# Patient Record
Sex: Female | Born: 1970 | Race: Black or African American | Hispanic: No | Marital: Single | Smoking: Never smoker
Health system: Southern US, Community
[De-identification: ages and names within clinical notes are randomized; demographics above are authoritative.]

## PROBLEM LIST (undated history)

## (undated) DIAGNOSIS — I10 Essential (primary) hypertension: Secondary | ICD-10-CM

## (undated) DIAGNOSIS — N898 Other specified noninflammatory disorders of vagina: Secondary | ICD-10-CM

## (undated) DIAGNOSIS — N760 Acute vaginitis: Principal | ICD-10-CM

## (undated) DIAGNOSIS — B379 Candidiasis, unspecified: Secondary | ICD-10-CM

## (undated) DIAGNOSIS — Z9289 Personal history of other medical treatment: Secondary | ICD-10-CM

## (undated) DIAGNOSIS — B9689 Other specified bacterial agents as the cause of diseases classified elsewhere: Secondary | ICD-10-CM

## (undated) DIAGNOSIS — F988 Other specified behavioral and emotional disorders with onset usually occurring in childhood and adolescence: Secondary | ICD-10-CM

## (undated) DIAGNOSIS — E119 Type 2 diabetes mellitus without complications: Secondary | ICD-10-CM

## (undated) DIAGNOSIS — F410 Panic disorder [episodic paroxysmal anxiety] without agoraphobia: Secondary | ICD-10-CM

## (undated) DIAGNOSIS — K219 Gastro-esophageal reflux disease without esophagitis: Secondary | ICD-10-CM

## (undated) DIAGNOSIS — L0232 Furuncle of buttock: Secondary | ICD-10-CM

## (undated) DIAGNOSIS — R7303 Prediabetes: Secondary | ICD-10-CM

## (undated) DIAGNOSIS — R12 Heartburn: Secondary | ICD-10-CM

## (undated) DIAGNOSIS — E669 Obesity, unspecified: Secondary | ICD-10-CM

## (undated) HISTORY — DX: Acute vaginitis: N76.0

## (undated) HISTORY — DX: Obesity, unspecified: E66.9

## (undated) HISTORY — PX: TUBAL LIGATION: SHX77

## (undated) HISTORY — DX: Other specified noninflammatory disorders of vagina: N89.8

## (undated) HISTORY — DX: Other specified bacterial agents as the cause of diseases classified elsewhere: B96.89

## (undated) HISTORY — PX: WISDOM TOOTH EXTRACTION: SHX21

## (undated) HISTORY — DX: Essential (primary) hypertension: I10

## (undated) HISTORY — DX: Gastro-esophageal reflux disease without esophagitis: K21.9

## (undated) HISTORY — DX: Candidiasis, unspecified: B37.9

## (undated) HISTORY — DX: Other specified behavioral and emotional disorders with onset usually occurring in childhood and adolescence: F98.8

## (undated) HISTORY — DX: Type 2 diabetes mellitus without complications: E11.9

## (undated) HISTORY — DX: Furuncle of buttock: L02.32

## (undated) HISTORY — DX: Heartburn: R12

## (undated) HISTORY — DX: Prediabetes: R73.03

---

## 2001-04-05 ENCOUNTER — Other Ambulatory Visit: Admission: RE | Admit: 2001-04-05 | Discharge: 2001-04-05 | Payer: Self-pay | Admitting: *Deleted

## 2003-03-18 ENCOUNTER — Emergency Department (HOSPITAL_COMMUNITY): Admission: EM | Admit: 2003-03-18 | Discharge: 2003-03-18 | Payer: Self-pay | Admitting: Emergency Medicine

## 2003-11-27 ENCOUNTER — Ambulatory Visit (HOSPITAL_COMMUNITY): Admission: RE | Admit: 2003-11-27 | Discharge: 2003-11-27 | Payer: Self-pay | Admitting: Family Medicine

## 2005-04-13 ENCOUNTER — Emergency Department (HOSPITAL_COMMUNITY): Admission: EM | Admit: 2005-04-13 | Discharge: 2005-04-13 | Payer: Self-pay | Admitting: Emergency Medicine

## 2007-12-20 ENCOUNTER — Other Ambulatory Visit: Admission: RE | Admit: 2007-12-20 | Discharge: 2007-12-20 | Payer: Self-pay | Admitting: Obstetrics and Gynecology

## 2010-01-16 ENCOUNTER — Ambulatory Visit (HOSPITAL_COMMUNITY): Admission: RE | Admit: 2010-01-16 | Discharge: 2010-01-16 | Payer: Self-pay | Admitting: Family Medicine

## 2010-08-12 ENCOUNTER — Other Ambulatory Visit: Admission: RE | Admit: 2010-08-12 | Discharge: 2010-08-12 | Payer: Self-pay | Admitting: Obstetrics and Gynecology

## 2010-11-02 ENCOUNTER — Encounter: Payer: Self-pay | Admitting: Family Medicine

## 2011-02-09 ENCOUNTER — Other Ambulatory Visit (HOSPITAL_COMMUNITY): Payer: Self-pay | Admitting: Internal Medicine

## 2011-02-09 DIAGNOSIS — Z139 Encounter for screening, unspecified: Secondary | ICD-10-CM

## 2011-02-19 ENCOUNTER — Ambulatory Visit (HOSPITAL_COMMUNITY)
Admission: RE | Admit: 2011-02-19 | Discharge: 2011-02-19 | Disposition: A | Discharge status: Home / Self Care | Payer: BC Managed Care – PPO | Source: Ambulatory Visit | Attending: Internal Medicine | Admitting: Internal Medicine

## 2011-02-19 DIAGNOSIS — Z139 Encounter for screening, unspecified: Secondary | ICD-10-CM

## 2011-02-19 DIAGNOSIS — Z1231 Encounter for screening mammogram for malignant neoplasm of breast: Secondary | ICD-10-CM | POA: Insufficient documentation

## 2011-06-16 ENCOUNTER — Ambulatory Visit: Payer: BC Managed Care – PPO | Admitting: Family Medicine

## 2011-08-18 ENCOUNTER — Other Ambulatory Visit (HOSPITAL_COMMUNITY)
Admission: RE | Admit: 2011-08-18 | Discharge: 2011-08-18 | Disposition: A | Discharge status: Home / Self Care | Payer: BC Managed Care – PPO | Source: Ambulatory Visit | Attending: Obstetrics and Gynecology | Admitting: Obstetrics and Gynecology

## 2011-08-18 ENCOUNTER — Other Ambulatory Visit: Payer: Self-pay | Admitting: Adult Health

## 2011-08-18 DIAGNOSIS — Z01419 Encounter for gynecological examination (general) (routine) without abnormal findings: Secondary | ICD-10-CM | POA: Insufficient documentation

## 2012-03-14 ENCOUNTER — Other Ambulatory Visit (HOSPITAL_COMMUNITY): Payer: Self-pay | Admitting: Family Medicine

## 2012-03-14 DIAGNOSIS — Z139 Encounter for screening, unspecified: Secondary | ICD-10-CM

## 2012-03-21 ENCOUNTER — Ambulatory Visit (HOSPITAL_COMMUNITY)
Admission: RE | Admit: 2012-03-21 | Discharge: 2012-03-21 | Disposition: A | Discharge status: Home / Self Care | Payer: BC Managed Care – PPO | Source: Ambulatory Visit | Attending: Family Medicine | Admitting: Family Medicine

## 2012-03-21 DIAGNOSIS — Z1231 Encounter for screening mammogram for malignant neoplasm of breast: Secondary | ICD-10-CM | POA: Insufficient documentation

## 2012-03-21 DIAGNOSIS — Z139 Encounter for screening, unspecified: Secondary | ICD-10-CM

## 2012-05-27 ENCOUNTER — Ambulatory Visit (INDEPENDENT_AMBULATORY_CARE_PROVIDER_SITE_OTHER): Payer: BC Managed Care – PPO | Admitting: Family Medicine

## 2012-05-27 ENCOUNTER — Other Ambulatory Visit: Payer: Self-pay | Admitting: Family Medicine

## 2012-05-27 ENCOUNTER — Encounter: Payer: Self-pay | Admitting: Family Medicine

## 2012-05-27 VITALS — BP 138/76 | HR 92 | Resp 18 | Ht 65.5 in | Wt 242.1 lb

## 2012-05-27 DIAGNOSIS — I10 Essential (primary) hypertension: Secondary | ICD-10-CM

## 2012-05-27 DIAGNOSIS — Z130 Encounter for screening for diseases of the blood and blood-forming organs and certain disorders involving the immune mechanism: Secondary | ICD-10-CM

## 2012-05-27 DIAGNOSIS — E669 Obesity, unspecified: Secondary | ICD-10-CM

## 2012-05-27 DIAGNOSIS — Z1329 Encounter for screening for other suspected endocrine disorder: Secondary | ICD-10-CM

## 2012-05-27 DIAGNOSIS — Z1321 Encounter for screening for nutritional disorder: Secondary | ICD-10-CM

## 2012-05-27 LAB — LIPID PANEL
HDL: 34 mg/dL — ABNORMAL LOW (ref >39)
LDL Cholesterol: 124 mg/dL — ABNORMAL HIGH (ref 0–99)
Total CHOL/HDL Ratio: 5.4 Ratio
Triglycerides: 134 mg/dL (ref <150)
VLDL: 27 mg/dL (ref 0–40)

## 2012-05-27 LAB — CBC
HCT: 35.3 % — ABNORMAL LOW (ref 36.0–46.0)
MCH: 25.7 pg — ABNORMAL LOW (ref 26.0–34.0)
MCHC: 32.9 g/dL (ref 30.0–36.0)
MCV: 78.1 fL (ref 78.0–100.0)
RDW: 14.3 % (ref 11.5–15.5)
WBC: 4.7 10*3/uL (ref 4.0–10.5)

## 2012-05-27 LAB — COMPREHENSIVE METABOLIC PANEL
ALT: 19 U/L (ref 0–35)
AST: 19 U/L (ref 0–37)
Alkaline Phosphatase: 65 U/L (ref 39–117)
Creat: 0.75 mg/dL (ref 0.50–1.10)
Total Bilirubin: 0.5 mg/dL (ref 0.3–1.2)

## 2012-05-27 NOTE — Patient Instructions (Addendum)
Continue blood pressure pill  Goal is 20 lbs weight loss by next visit in 3 months  Get the labs done, we will call with results  Follow diet handout Increase activity to 30 minutes 5 days a week F/U 3 months

## 2012-05-27 NOTE — Progress Notes (Signed)
  Subjective:    Patient ID: Ariel [name], female    DOB: 1971/08/05, 41 y.o.   MRN: [id number]  HPI Patient presents to establish care. Previous PCP [facility].  Medications and history reviewed  She's concerned about her weight. She is currently at her heaviest at 242 pounds. She has used phentermine and injections in the past when she was at a weight loss clinic many years ago she lost 20 pounds at that time. She recently started walking on a treadmill 30 minutes 3 times a week. She is a very poor diet and often snacks throughout the day. She works second shift and typically eat at [employer] located in her work.    Review of Systems   GEN- denies fatigue, fever, weight loss,weakness, recent illness HEENT- denies eye drainage, change in vision, nasal discharge, CVS- denies chest pain, palpitations RESP- denies SOB, cough, wheeze ABD- denies N/V, change in stools, abd pain GU- denies dysuria, hematuria, dribbling, incontinence MSK- denies joint pain, muscle aches, injury Neuro- denies headache, dizziness, syncope, seizure activity      Objective:   Physical Exam GEN- NAD, alert and oriented x3 HEENT- PERRL, EOMI, non injected sclera, pink conjunctiva, MMM, oropharynx clear Neck- Supple, no thyromegaly CVS- RRR, no murmur RESP-CTAB ABD-NABS,soft,NT,ND EXT- No edema Pulses- Radial, DP- 2+ Psych-normal affect and Mood       Assessment & Plan:

## 2012-05-30 ENCOUNTER — Encounter: Payer: Self-pay | Admitting: Family Medicine

## 2012-05-30 DIAGNOSIS — I10 Essential (primary) hypertension: Secondary | ICD-10-CM | POA: Insufficient documentation

## 2012-05-30 LAB — HEMOGLOBIN A1C: Mean Plasma Glucose: 128 mg/dL — ABNORMAL HIGH (ref <117)

## 2012-05-30 NOTE — Assessment & Plan Note (Addendum)
Patient motivated to lose weight. She needs baseline labs reviewed. In current should increase exercise and change in diet. We will consider restarting phentermine Short-term goal set. She was given handout for 1500-calorie diet

## 2012-05-30 NOTE — Assessment & Plan Note (Addendum)
Continue HCTZ for blood pressure. Will obtain baseline labs including metabolic panel CBC and thyroid studies

## 2012-06-01 LAB — VITAMIN D 1,25 DIHYDROXY: Vitamin D3 1, 25 (OH)2: 45 pg/mL

## 2012-09-27 ENCOUNTER — Other Ambulatory Visit (HOSPITAL_COMMUNITY)
Admission: RE | Admit: 2012-09-27 | Discharge: 2012-09-27 | Disposition: A | Discharge status: Home / Self Care | Payer: BC Managed Care – PPO | Source: Ambulatory Visit | Attending: Obstetrics and Gynecology | Admitting: Obstetrics and Gynecology

## 2012-09-27 ENCOUNTER — Other Ambulatory Visit: Payer: Self-pay | Admitting: Adult Health

## 2012-09-27 DIAGNOSIS — Z01419 Encounter for gynecological examination (general) (routine) without abnormal findings: Secondary | ICD-10-CM | POA: Insufficient documentation

## 2012-09-27 DIAGNOSIS — Z1151 Encounter for screening for human papillomavirus (HPV): Secondary | ICD-10-CM | POA: Insufficient documentation

## 2012-09-30 ENCOUNTER — Encounter (HOSPITAL_COMMUNITY): Payer: Self-pay

## 2012-09-30 ENCOUNTER — Emergency Department (HOSPITAL_COMMUNITY)
Admission: EM | Admit: 2012-09-30 | Discharge: 2012-09-30 | Disposition: A | Discharge status: Home / Self Care | Payer: BC Managed Care – PPO | Attending: Emergency Medicine | Admitting: Emergency Medicine

## 2012-09-30 DIAGNOSIS — S61209A Unspecified open wound of unspecified finger without damage to nail, initial encounter: Secondary | ICD-10-CM | POA: Insufficient documentation

## 2012-09-30 DIAGNOSIS — S61219A Laceration without foreign body of unspecified finger without damage to nail, initial encounter: Secondary | ICD-10-CM

## 2012-09-30 DIAGNOSIS — I10 Essential (primary) hypertension: Secondary | ICD-10-CM | POA: Insufficient documentation

## 2012-09-30 DIAGNOSIS — Y93G1 Activity, food preparation and clean up: Secondary | ICD-10-CM | POA: Insufficient documentation

## 2012-09-30 DIAGNOSIS — Y929 Unspecified place or not applicable: Secondary | ICD-10-CM | POA: Insufficient documentation

## 2012-09-30 DIAGNOSIS — Z79899 Other long term (current) drug therapy: Secondary | ICD-10-CM | POA: Insufficient documentation

## 2012-09-30 DIAGNOSIS — Z23 Encounter for immunization: Secondary | ICD-10-CM | POA: Insufficient documentation

## 2012-09-30 DIAGNOSIS — W268XXA Contact with other sharp object(s), not elsewhere classified, initial encounter: Secondary | ICD-10-CM | POA: Insufficient documentation

## 2012-09-30 MED ORDER — TETANUS-DIPHTH-ACELL PERTUSSIS 5-2.5-18.5 LF-MCG/0.5 IM SUSP
INTRAMUSCULAR | Status: AC
  Administered 2012-09-30: 0.5 mL via INTRAMUSCULAR
  Filled 2012-09-30: qty 0.5

## 2012-09-30 MED ORDER — LIDOCAINE HCL (PF) 1 % IJ SOLN
INTRAMUSCULAR | Status: AC
  Filled 2012-09-30: qty 5

## 2012-09-30 MED ORDER — TETANUS-DIPHTHERIA TOXOIDS TD 5-2 LFU IM INJ
0.5000 mL | INJECTION | Freq: Once | INTRAMUSCULAR | Status: DC
  Filled 2012-09-30: qty 0.5

## 2012-09-30 MED ORDER — TETANUS-DIPHTH-ACELL PERTUSSIS 5-2.5-18.5 LF-MCG/0.5 IM SUSP
0.5000 mL | Freq: Once | INTRAMUSCULAR | Status: AC
  Administered 2012-09-30: 0.5 mL via INTRAMUSCULAR
  Filled 2012-09-30: qty 0.5

## 2012-09-30 NOTE — ED Notes (Signed)
Pt with laceration to right middle finger tip after washing dishes. Bleeding self controlled. Wound appears to be 1in going from right side cuticle to middle for first pad.

## 2012-09-30 NOTE — ED Notes (Signed)
Patient with no complaints at this time. Respirations even and unlabored. Skin warm/dry. Discharge instructions reviewed with patient at this time. Patient given opportunity to voice concerns/ask questions. Patient discharged at this time and left Emergency Department with steady gait.   

## 2012-09-30 NOTE — ED Notes (Signed)
Laceration cart and tray placed at bedside for laceration repair.

## 2012-09-30 NOTE — ED Provider Notes (Signed)
Medical screening examination/treatment/procedure(s) were performed by non-physician practitioner and as supervising physician I was immediately available for consultation/collaboration.   Joya Gaskins, MD 09/30/12 [phone]

## 2012-09-30 NOTE — ED Provider Notes (Signed)
History     CSN: [id number]  Arrival date & time 09/30/12  0902   First MD Initiated Contact with Patient 09/30/12 [phone]      Chief Complaint  Patient presents with  . Extremity Laceration    HPI Ariel [name] is a 41 y.o. female who presents to the ED with a laceration to the right middle finger. The laceration occurred this morning. Patient states she was washing dishes and a glass broke and cut the tip of her finger. She denies any other problems. The history was provided by the patient.  Past Medical History  Diagnosis Date  . Hypertension     Past Surgical History  Procedure Date  . Tubal ligation     Family History  Problem Relation Age of Onset  . Hypertension Mother   . Diabetes Father   . Hypertension Father     History  Substance Use Topics  . Smoking status: Never Smoker   . Smokeless tobacco: Not on file  . Alcohol Use: No    OB History    Grav Para Term Preterm Abortions TAB SAB Ect Mult Living                  Review of Systems: As stated in HPI  Allergies  Review of patient's allergies indicates no known allergies.  Home Medications   Current Outpatient Rx  Name  Route  Sig  Dispense  Refill  . HYDROCHLOROTHIAZIDE 12.5 MG PO CAPS   Oral   Take 12.5 mg by mouth daily.           BP 150/80  Pulse 87  Temp 98.2 F (36.8 C) (Oral)  Resp 18  Ht 5\' 5"  (1.651 m)  Wt 240 lb (108.863 kg)  BMI 39.94 kg/m2  SpO2 99%  LMP 08/31/2012  Physical Exam  Nursing note and vitals reviewed. Constitutional: She is oriented to person, place, and time. She appears well-developed and well-nourished. No distress.  HENT:  Head: Normocephalic.  Eyes: EOM are normal.  Neck: Neck supple.  Cardiovascular: Normal rate.   Pulmonary/Chest: Effort normal.  Musculoskeletal: Normal range of motion.       Laceration to right middle finger. Normal sensation, good strength. Bleeding controlled. See skin exam  Neurological: She is alert and oriented to  person, place, and time.  Skin: Skin is warm and dry. Laceration noted.       Laceration to tip of right middle finger.  Psychiatric: She has a normal mood and affect. Her behavior is normal. Judgment and thought content normal.   ED Course  Procedures Verbal consent for laceration repair of right middle finger 1 cm laceration to fingertip  Patient positioned and area draped with sterile towels.  Preoperative medication:  Local infiltrate with lidocaine 1%. Amount 1 ccs Cleaned with betadine Irrigated with NSS  Wound closed with 5-0 prolene x 4 sutures simple interrupted Patient tolerated procedure well  TDap given 0.5 ml  Assessment: 41 y.o. female with laceration to right middle finger  Plan:  Bacitracin Ointment, dressing   Return for suture removal in 7 days, return sooner for any problems.   Advil prn  Discussed with the patient and all questioned fully answered. She willreturn if any problems arise.     Janne Napoleon, [place] 09/30/12 1036

## 2012-09-30 NOTE — ED Notes (Signed)
Patient provided VIS for Tdap, education provided by RN on normal site reactions and s/s to be aware of for tetanus shot and when to seek immediate medical attention. Patient gave verbal understanding.

## 2013-05-17 ENCOUNTER — Other Ambulatory Visit: Payer: Self-pay | Admitting: Obstetrics and Gynecology

## 2013-06-27 ENCOUNTER — Other Ambulatory Visit: Payer: Self-pay | Admitting: Obstetrics and Gynecology

## 2013-07-12 ENCOUNTER — Ambulatory Visit (INDEPENDENT_AMBULATORY_CARE_PROVIDER_SITE_OTHER): Payer: BC Managed Care – PPO | Admitting: Family Medicine

## 2013-07-12 ENCOUNTER — Encounter: Payer: Self-pay | Admitting: Family Medicine

## 2013-07-12 VITALS — BP 124/80 | HR 90 | Temp 97.6°F | Resp 20 | Ht 64.0 in | Wt 255.0 lb

## 2013-07-12 DIAGNOSIS — Z0000 Encounter for general adult medical examination without abnormal findings: Secondary | ICD-10-CM

## 2013-07-12 DIAGNOSIS — R7303 Prediabetes: Secondary | ICD-10-CM

## 2013-07-12 DIAGNOSIS — I10 Essential (primary) hypertension: Secondary | ICD-10-CM

## 2013-07-12 DIAGNOSIS — E669 Obesity, unspecified: Secondary | ICD-10-CM

## 2013-07-12 DIAGNOSIS — E119 Type 2 diabetes mellitus without complications: Secondary | ICD-10-CM | POA: Insufficient documentation

## 2013-07-12 DIAGNOSIS — Z1231 Encounter for screening mammogram for malignant neoplasm of breast: Secondary | ICD-10-CM

## 2013-07-12 DIAGNOSIS — R300 Dysuria: Secondary | ICD-10-CM

## 2013-07-12 DIAGNOSIS — N390 Urinary tract infection, site not specified: Secondary | ICD-10-CM

## 2013-07-12 DIAGNOSIS — Z23 Encounter for immunization: Secondary | ICD-10-CM

## 2013-07-12 DIAGNOSIS — R7309 Other abnormal glucose: Secondary | ICD-10-CM

## 2013-07-12 LAB — LIPID PANEL
HDL: 39 mg/dL — ABNORMAL LOW (ref >39)
LDL Cholesterol: 102 mg/dL — ABNORMAL HIGH (ref 0–99)
Triglycerides: 120 mg/dL (ref <150)

## 2013-07-12 LAB — CBC WITH DIFFERENTIAL/PLATELET
Eosinophils Relative: 2 % (ref 0–5)
HCT: 35.1 % — ABNORMAL LOW (ref 36.0–46.0)
Lymphocytes Relative: 38 % (ref 12–46)
Lymphs Abs: 2.1 10*3/uL (ref 0.7–4.0)
MCV: 79.1 fL (ref 78.0–100.0)
Monocytes Absolute: 0.5 10*3/uL (ref 0.1–1.0)
RBC: 4.44 MIL/uL (ref 3.87–5.11)
WBC: 5.7 10*3/uL (ref 4.0–10.5)

## 2013-07-12 LAB — URINALYSIS, ROUTINE W REFLEX MICROSCOPIC
Leukocytes, UA: NEGATIVE
Nitrite: NEGATIVE
pH: 6 (ref 5.0–8.0)

## 2013-07-12 LAB — COMPREHENSIVE METABOLIC PANEL
BUN: 9 mg/dL (ref 6–23)
CO2: 27 mEq/L (ref 19–32)
Creat: 0.74 mg/dL (ref 0.50–1.10)
Glucose, Bld: 103 mg/dL — ABNORMAL HIGH (ref 70–99)
Total Bilirubin: 0.4 mg/dL (ref 0.3–1.2)

## 2013-07-12 LAB — HEMOGLOBIN A1C
Hgb A1c MFr Bld: 6.5 % — ABNORMAL HIGH (ref <5.7)
Mean Plasma Glucose: 140 mg/dL — ABNORMAL HIGH (ref <117)

## 2013-07-12 LAB — URINALYSIS, MICROSCOPIC ONLY: Casts: NONE SEEN

## 2013-07-12 MED ORDER — PHENTERMINE HCL 37.5 MG PO TABS: 37.5000 mg | ORAL_TABLET | Freq: Every day | ORAL | Status: DC

## 2013-07-12 MED ORDER — HYDROCHLOROTHIAZIDE 12.5 MG PO CAPS: ORAL_CAPSULE | ORAL | Status: DC

## 2013-07-12 NOTE — Assessment & Plan Note (Signed)
Plan to start phentermine as directed. Weight loss plan has been set up. Including exercise see instructions. Followup 2 months

## 2013-07-12 NOTE — Patient Instructions (Addendum)
Get the mammogram done  Start phentermine 1/2 tablet x 2 weeks, then increase to 1 tablet Exercise  4 days week -- 30 minutes a day  No soda, okay to have 1 tea a day, rest water  Avoid fried foods, and fast food- get salad  Eat more fruits and veggies with each meal Flu shot given We will send urine culture F/u 2 months

## 2013-07-12 NOTE — Assessment & Plan Note (Signed)
Check A1C 

## 2013-07-12 NOTE — Assessment & Plan Note (Signed)
BP looks okay, will continue HCTZ

## 2013-07-12 NOTE — Progress Notes (Signed)
  Subjective:    Patient ID: Ariel [name], female    DOB: December 09, 1970, 42 y.o.   MRN: [id number]  HPI Patient here for complete physical exam without Pap smear. She was last seen at her visit when she establish care in August of 2013. She's history of hypertension as well as prediabetes noted on labs from last year. She's concerned about her weight and wants to restart phentermine. In the past she lost 15-20 pounds using this but was only on for a couple months. At that time she is being followed by weight loss clinic but was unable to afford to keep going. She does not work out on a regular basis. She has very poor dietary habits including fast food, soda and snacking. She does work third shift which she feels her meals get mixed up because of this.  Her last Pap smear was in December 2013 which was within normal limits [facility]. She is overdue for her mammogram. She is due for flu shot.  She has had some mild dysuria and vaginal irritation for the past couple weeks. She feels some pressure when she urinates. She denies any vaginal discharge or abnormal vaginal bleeding. She is due to start her menses in 2 days.   Review of Systems  GEN- denies fatigue, fever, weight loss,weakness, recent illness HEENT- denies eye drainage, change in vision, nasal discharge, CVS- denies chest pain, palpitations RESP- denies SOB, cough, wheeze ABD- denies N/V, change in stools, abd pain GU- + dysuria, denies hematuria, dribbling, incontinence MSK- denies joint pain, muscle aches, injury Neuro- denies headache, dizziness, syncope, seizure activity      Objective:   Physical Exam GEN- NAD, alert and oriented x3,obese HEENT- PERRL, EOMI, non injected sclera, pink conjunctiva, MMM, oropharynx clear Neck- Supple, no thyromegaly CVS- RRR, no murmur RESP-CTAB ABD-NABS,soft,NT,ND, no CVA tenderness GU- Deferred EXT- No edema Pulses- Radial, DP- 2+        Assessment & Plan:   CPE- CPE  done, PAP UTD, schedule Mammogram, fasting labs today. FLu shot given

## 2013-07-12 NOTE — Assessment & Plan Note (Signed)
UA shows bacteria, cloudy appearance Will send for culture

## 2013-07-13 LAB — URINE CULTURE

## 2013-07-18 ENCOUNTER — Ambulatory Visit (HOSPITAL_COMMUNITY)
Admission: RE | Admit: 2013-07-18 | Discharge: 2013-07-18 | Disposition: A | Discharge status: Home / Self Care | Payer: BC Managed Care – PPO | Source: Ambulatory Visit | Attending: Family Medicine | Admitting: Family Medicine

## 2013-07-18 DIAGNOSIS — Z1231 Encounter for screening mammogram for malignant neoplasm of breast: Secondary | ICD-10-CM | POA: Insufficient documentation

## 2013-08-09 ENCOUNTER — Encounter: Payer: Self-pay | Admitting: Adult Health

## 2013-08-09 ENCOUNTER — Ambulatory Visit (INDEPENDENT_AMBULATORY_CARE_PROVIDER_SITE_OTHER): Payer: BC Managed Care – PPO | Admitting: Adult Health

## 2013-08-09 VITALS — BP 140/90 | Ht 65.0 in | Wt 252.0 lb

## 2013-08-09 DIAGNOSIS — I10 Essential (primary) hypertension: Secondary | ICD-10-CM

## 2013-08-09 DIAGNOSIS — A499 Bacterial infection, unspecified: Secondary | ICD-10-CM

## 2013-08-09 DIAGNOSIS — B9689 Other specified bacterial agents as the cause of diseases classified elsewhere: Secondary | ICD-10-CM

## 2013-08-09 DIAGNOSIS — N898 Other specified noninflammatory disorders of vagina: Secondary | ICD-10-CM

## 2013-08-09 DIAGNOSIS — N760 Acute vaginitis: Secondary | ICD-10-CM

## 2013-08-09 DIAGNOSIS — L293 Anogenital pruritus, unspecified: Secondary | ICD-10-CM

## 2013-08-09 HISTORY — DX: Other specified noninflammatory disorders of vagina: N89.8

## 2013-08-09 HISTORY — DX: Other specified bacterial agents as the cause of diseases classified elsewhere: B96.89

## 2013-08-09 LAB — POCT WET PREP (WET MOUNT): WBC, Wet Prep HPF POC: POSITIVE

## 2013-08-09 MED ORDER — METRONIDAZOLE 500 MG PO TABS: 500.0000 mg | ORAL_TABLET | Freq: Two times a day (BID) | ORAL | Status: DC

## 2013-08-09 NOTE — Patient Instructions (Signed)
Bacterial Vaginosis Bacterial vaginosis (BV) is a vaginal infection where the normal balance of bacteria in the vagina is disrupted. The normal balance is then replaced by an overgrowth of certain bacteria. There are several different kinds of bacteria that can cause BV. BV is the most common vaginal infection in women of childbearing age. CAUSES   The cause of BV is not fully understood. BV develops when there is an increase or imbalance of harmful bacteria.  Some activities or behaviors can upset the normal balance of bacteria in the vagina and put women at increased risk including:  Having a new sex partner or multiple sex partners.  Douching.  Using an intrauterine device (IUD) for contraception.  It is not clear what role sexual activity plays in the development of BV. However, women that have never had sexual intercourse are rarely infected with BV. Women do not get BV from toilet seats, bedding, swimming pools or from touching objects around them.  SYMPTOMS   Grey vaginal discharge.  A fish-like odor with discharge, especially after sexual intercourse.  Itching or burning of the vagina and vulva.  Burning or pain with urination.  Some women have no signs or symptoms at all. DIAGNOSIS  Your caregiver must examine the vagina for signs of BV. Your caregiver will perform lab tests and look at the sample of vaginal fluid through a microscope. They will look for bacteria and abnormal cells (clue cells), a pH test higher than 4.5, and a positive amine test all associated with BV.  RISKS AND COMPLICATIONS   Pelvic inflammatory disease (PID).  Infections following gynecology surgery.  Developing HIV.  Developing herpes virus. TREATMENT  Sometimes BV will clear up without treatment. However, all women with symptoms of BV should be treated to avoid complications, especially if gynecology surgery is planned. Female partners generally do not need to be treated. However, BV may spread  between female sex partners so treatment is helpful in preventing a recurrence of BV.   BV may be treated with antibiotics. The antibiotics come in either pill or vaginal cream forms. Either can be used with nonpregnant or pregnant women, but the recommended dosages differ. These antibiotics are not harmful to the baby.  BV can recur after treatment. If this happens, a second round of antibiotics will often be prescribed.  Treatment is important for pregnant women. If not treated, BV can cause a premature delivery, especially for a pregnant woman who had a premature birth in the past. All pregnant women who have symptoms of BV should be checked and treated.  For chronic reoccurrence of BV, treatment with a type of prescribed gel vaginally twice a week is helpful. HOME CARE INSTRUCTIONS   Finish all medication as directed by your caregiver.  Do not have sex until treatment is completed.  Tell your sexual partner that you have a vaginal infection. They should see their caregiver and be treated if they have problems, such as a mild rash or itching.  Practice safe sex. Use condoms. Only have 1 sex partner. PREVENTION  Basic prevention steps can help reduce the risk of upsetting the natural balance of bacteria in the vagina and developing BV:  Do not have sexual intercourse (be abstinent).  Do not douche.  Use all of the medicine prescribed for treatment of BV, even if the signs and symptoms go away.  Tell your sex partner if you have BV. That way, they can be treated, if needed, to prevent reoccurrence. SEEK MEDICAL CARE IF:     Your symptoms are not improving after 3 days of treatment.  You have increased discharge, pain, or fever. MAKE SURE YOU:   Understand these instructions.  Will watch your condition.  Will get help right away if you are not doing well or get worse. FOR MORE INFORMATION  Division of STD Prevention (DSTDP), Centers for Disease Control and Prevention:  [employer]s.[employer].za American Social Health Association (ASHA): www.ashastd.org  Document Released: 09/28/2005 Document Revised: 12/21/2011 Document Reviewed: 03/21/2009 [facility] Patient Information 2014 [place], [place]. Take flagyl no sex no alcohol Take BP MED

## 2013-08-09 NOTE — Progress Notes (Signed)
Subjective:     Patient ID: Ariel [name], female   DOB: Mar 04, 1971, 42 y.o.   MRN: [id number]  HPI Ariel [name] is a 42 year old black female in complaining of vaginal itch and odor at times, sometimes it burns.No sex lately.Has not taken BP meds lately and is taking adipex by Dr Jeanice Lim.  Review of Systems Positives in HPI Reviewed past medical,surgical, social and family history. Reviewed medications and allergies.     Objective:   Physical Exam BP 140/90  Ht 5\' 5"  (1.651 m)  Wt 252 lb (114.306 kg)  BMI 41.93 kg/m2  LMP 06/18/2013   Skin warm and dry.Pelvic: external genitalia is normal in appearance, vagina: white discharge with odor, cervix:smooth and bulbous, uterus: normal size, shape and contour, non tender, no masses felt, adnexa: no masses or tenderness noted. Wet prep: + for clue cells and +WBCs.  Assessment:     Vaginal itch BV   Hypertension  Plan:     Rx flagyl 500 mg 1 bid x 7 days, no alcohol, review handout on BV,no sex Take BP meds, decrease salt try to lose 10 lbs Follow up prn

## 2013-09-13 ENCOUNTER — Ambulatory Visit: Payer: BC Managed Care – PPO | Admitting: Family Medicine

## 2013-12-25 ENCOUNTER — Other Ambulatory Visit: Payer: BC Managed Care – PPO | Admitting: Adult Health

## 2014-01-09 ENCOUNTER — Other Ambulatory Visit: Payer: Self-pay | Admitting: Family Medicine

## 2014-01-10 NOTE — Telephone Encounter (Signed)
Declined needs appt

## 2014-01-10 NOTE — Telephone Encounter (Signed)
Ok to refill??  Last office visit/ refill 07/12/2013.

## 2014-01-11 NOTE — Telephone Encounter (Signed)
Appointment scheduled.

## 2014-01-11 NOTE — Telephone Encounter (Signed)
LMTRC

## 2014-01-15 ENCOUNTER — Encounter: Payer: Self-pay | Admitting: Family Medicine

## 2014-01-15 ENCOUNTER — Ambulatory Visit (INDEPENDENT_AMBULATORY_CARE_PROVIDER_SITE_OTHER): Payer: BC Managed Care – PPO | Admitting: Family Medicine

## 2014-01-15 VITALS — BP 152/88 | HR 72 | Temp 97.7°F | Resp 14 | Ht 65.0 in | Wt 247.0 lb

## 2014-01-15 DIAGNOSIS — M25561 Pain in right knee: Secondary | ICD-10-CM

## 2014-01-15 DIAGNOSIS — E669 Obesity, unspecified: Secondary | ICD-10-CM

## 2014-01-15 DIAGNOSIS — I10 Essential (primary) hypertension: Secondary | ICD-10-CM

## 2014-01-15 DIAGNOSIS — R7303 Prediabetes: Secondary | ICD-10-CM

## 2014-01-15 DIAGNOSIS — M25569 Pain in unspecified knee: Secondary | ICD-10-CM

## 2014-01-15 DIAGNOSIS — R7309 Other abnormal glucose: Secondary | ICD-10-CM

## 2014-01-15 LAB — COMPREHENSIVE METABOLIC PANEL
ALT: 17 U/L (ref 0–35)
AST: 17 U/L (ref 0–37)
Albumin: 3.9 g/dL (ref 3.5–5.2)
Alkaline Phosphatase: 68 U/L (ref 39–117)
BUN: 7 mg/dL (ref 6–23)
CALCIUM: 9.4 mg/dL (ref 8.4–10.5)
CHLORIDE: 100 meq/L (ref 96–112)
CO2: 26 meq/L (ref 19–32)
CREATININE: 0.8 mg/dL (ref 0.50–1.10)
Glucose, Bld: 120 mg/dL — ABNORMAL HIGH (ref 70–99)
Potassium: 4.1 mEq/L (ref 3.5–5.3)
Sodium: 135 mEq/L (ref 135–145)
Total Bilirubin: 0.4 mg/dL (ref 0.2–1.2)
Total Protein: 7 g/dL (ref 6.0–8.3)

## 2014-01-15 LAB — CBC WITH DIFFERENTIAL/PLATELET
BASOS PCT: 0 % (ref 0–1)
Basophils Absolute: 0 10*3/uL (ref 0.0–0.1)
EOS ABS: 0.1 10*3/uL (ref 0.0–0.7)
Eosinophils Relative: 2 % (ref 0–5)
HEMATOCRIT: 36.9 % (ref 36.0–46.0)
HEMOGLOBIN: 12.4 g/dL (ref 12.0–15.0)
Lymphocytes Relative: 38 % (ref 12–46)
Lymphs Abs: 2.1 10*3/uL (ref 0.7–4.0)
MCH: 26 pg (ref 26.0–34.0)
MCHC: 33.6 g/dL (ref 30.0–36.0)
MCV: 77.4 fL — ABNORMAL LOW (ref 78.0–100.0)
MONO ABS: 0.5 10*3/uL (ref 0.1–1.0)
MONOS PCT: 9 % (ref 3–12)
NEUTROS PCT: 51 % (ref 43–77)
Neutro Abs: 2.8 10*3/uL (ref 1.7–7.7)
Platelets: 333 10*3/uL (ref 150–400)
RBC: 4.77 MIL/uL (ref 3.87–5.11)
RDW: 14 % (ref 11.5–15.5)
WBC: 5.5 10*3/uL (ref 4.0–10.5)

## 2014-01-15 LAB — LIPID PANEL
CHOL/HDL RATIO: 4.5 ratio
CHOLESTEROL: 170 mg/dL (ref 0–200)
HDL: 38 mg/dL — AB (ref >39)
LDL Cholesterol: 107 mg/dL — ABNORMAL HIGH (ref 0–99)
TRIGLYCERIDES: 127 mg/dL (ref <150)
VLDL: 25 mg/dL (ref 0–40)

## 2014-01-15 LAB — HEMOGLOBIN A1C
Hgb A1c MFr Bld: 6.6 % — ABNORMAL HIGH (ref <5.7)
Mean Plasma Glucose: 143 mg/dL — ABNORMAL HIGH (ref <117)

## 2014-01-15 MED ORDER — PHENTERMINE HCL 37.5 MG PO TABS: 37.5000 mg | ORAL_TABLET | Freq: Every day | ORAL | Status: DC

## 2014-01-15 MED ORDER — NAPROXEN 500 MG PO TABS: 500.0000 mg | ORAL_TABLET | Freq: Two times a day (BID) | ORAL | Status: DC

## 2014-01-15 NOTE — Assessment & Plan Note (Signed)
No red flags on exam good range of motion no effusion. (Naprosyn twice a day think we can hold on imaging she still able to do her regular activities. She does not improve within 4 weeks and we will consider imaging her knee. She may have some mild osteoarthritis as well do to her weight and her activity

## 2014-01-15 NOTE — Progress Notes (Signed)
Patient ID: Ariel [name], female   DOB: 07/29/1971, 43 y.o.   MRN: [id number]   Subjective:    Patient ID: Ariel ScrapeRita T [name], female    DOB: 06/10/1971, 43 y.o.   MRN: [id number]  Patient presents for F/U phenteramine and R knee pain  patient here to followup phentermine. She has a BMI of 41. She was started on phentermine back in October however she did not followup. She states she would take a half a tablet a few days a week. She has lost 7 pounds since her last visit. She's trying to change her diet and her job is very physical. She would like to restart the phentermine  Also complains of right knee pain for the past month. She states that she initially her knee on the side of her dresser after that she had some pain but felt sore inside she was then involved in a social activity where there was a lot of jumping and moving around in her knee began to hurt worse. She's not had any swelling locking or giving out. She did take a couple of doses of ibuprofen which helped the knee pain.    Review Of Systems:  GEN- denies fatigue, fever, weight loss,weakness, recent illness HEENT- denies eye drainage, change in vision, nasal discharge, CVS- denies chest pain, palpitations RESP- denies SOB, cough, wheeze MSK- +joint pain, muscle aches, injury Neuro- denies headache, dizziness, syncope, seizure activity       Objective:    BP 152/88  Pulse 72  Temp(Src) 97.7 F (36.5 C) (Oral)  Resp 14  Ht 5\' 5"  (1.651 m)  Wt 247 lb (112.038 kg)  BMI 41.10 kg/m2  LMP 12/19/2013 GEN- NAD, alert and oriented x3 HEENT- PERRL, EOMI, non injected sclera, pink conjunctiva, MMM, oropharynx clear CVS- RRR, no murmur RESP-CTAB MSK- knee normal inspection bilat, no effusion bilat, GOOD ROM bilat, RIght knee mild TTP lateral aspect of knee, no bruising, no crepitus, ligaments in tact Gait non antalgic EXT- No edema Pulses- Radial 2+        Assessment & Plan:      Problem List Items Addressed This  Visit   Prediabetes - Primary   Relevant Orders      CBC with Differential      Hemoglobin A1c      Comprehensive metabolic panel      Lipid panel   Essential hypertension, benign     No BP meds today    Relevant Orders      CBC with Differential      Comprehensive metabolic panel      Lipid panel      Note: This dictation was prepared with Dragon dictation along with smaller phrase technology. Any transcriptional errors that result from this process are unintentional.

## 2014-01-15 NOTE — Assessment & Plan Note (Addendum)
No BP meds today, recheck at next visit

## 2014-01-15 NOTE — Patient Instructions (Signed)
We will call with lab results Restart Phentermine 1 tablet daily  Continue BP meds take before next visit Naprosyn for your knee F/U 4 weeks for medications

## 2014-01-15 NOTE — Assessment & Plan Note (Signed)
Recheck A1c her last A1c was right at 6.5% 6 months ago As the importance of diet and weight loss

## 2014-01-15 NOTE — Assessment & Plan Note (Signed)
We discussed the phentermine she was to try again by taking on a regular basis. Advise her to start with one half tablet a day she may increase to 2 tablets after 2 weeks she will followup in 4 weeks time. Discussed how this medication works as well as dietary plan along with it and continued aerobic exercise.

## 2014-01-18 ENCOUNTER — Other Ambulatory Visit: Payer: Self-pay | Admitting: *Deleted

## 2014-01-18 MED ORDER — BLOOD GLUCOSE MONITOR KIT: PACK | Status: DC

## 2014-01-18 NOTE — Telephone Encounter (Signed)
Per orders noted on labs, medication sent to pharmacy.   Call placed to patient and patient made aware. 

## 2014-02-12 ENCOUNTER — Ambulatory Visit: Payer: BC Managed Care – PPO | Admitting: Family Medicine

## 2014-02-13 ENCOUNTER — Ambulatory Visit (INDEPENDENT_AMBULATORY_CARE_PROVIDER_SITE_OTHER): Payer: BC Managed Care – PPO | Admitting: Family Medicine

## 2014-02-13 ENCOUNTER — Encounter: Payer: Self-pay | Admitting: Family Medicine

## 2014-02-13 VITALS — BP 142/72 | HR 76 | Temp 98.0°F | Resp 16 | Ht 64.5 in | Wt 244.0 lb

## 2014-02-13 DIAGNOSIS — M25579 Pain in unspecified ankle and joints of unspecified foot: Secondary | ICD-10-CM

## 2014-02-13 DIAGNOSIS — M25569 Pain in unspecified knee: Secondary | ICD-10-CM

## 2014-02-13 DIAGNOSIS — M25561 Pain in right knee: Secondary | ICD-10-CM

## 2014-02-13 DIAGNOSIS — E669 Obesity, unspecified: Secondary | ICD-10-CM

## 2014-02-13 DIAGNOSIS — I10 Essential (primary) hypertension: Secondary | ICD-10-CM

## 2014-02-13 MED ORDER — PHENTERMINE HCL 37.5 MG PO TABS: 37.5000 mg | ORAL_TABLET | Freq: Every day | ORAL | Status: DC

## 2014-02-13 MED ORDER — TRAMADOL HCL 50 MG PO TABS: 50.0000 mg | ORAL_TABLET | Freq: Four times a day (QID) | ORAL | Status: DC | PRN

## 2014-02-13 NOTE — Assessment & Plan Note (Signed)
Continue current medications BP improved today

## 2014-02-13 NOTE — Assessment & Plan Note (Signed)
Continue phentermine f/u 4 weeks

## 2014-02-13 NOTE — Patient Instructions (Signed)
Try the ultram for pain Continue all other medications Get xrays done, we will call with results F/U 4 weeks

## 2014-02-13 NOTE — Progress Notes (Signed)
Patient ID: Ariel [name], female   DOB: 04/19/1971, 43 y.o.   MRN: [id number]   Subjective:    Patient ID: Ariel ScrapeRita T Jaquess, female    DOB: 03/24/1971, 43 y.o.   MRN: [id number]  Patient presents for medication review and R leg pain  patient here to followup medications. She's been taking her blood pressure regular basis states it when she checks it at work is been 120s over 80s. She's not had any side effects from the medication.  Obesity she's been taking the phentermine on a more regular basis however she's not been very active because of her right knee and foot pain. States that the pain is low her knee and also on the top part of her foot near her ankle. She did take some of the anti-inflammatories been on a regular basis. She has lost 3 pounds over the past 4 weeks with the phentermine. Her A1c was also noted to be at 6.6% however I did not start her on any medications as she has been losing weight.    Review Of Systems:  GEN- denies fatigue, fever, weight loss,weakness, recent illness HEENT- denies eye drainage, change in vision, nasal discharge, CVS- denies chest pain, palpitations RESP- denies SOB, cough, wheeze MSK- +joint pain, muscle aches, injury Neuro- denies headache, dizziness, syncope, seizure activity       Objective:    BP 142/72  Pulse 76  Temp(Src) 98 F (36.7 C) (Oral)  Resp 16  Ht 5' 4.5" (1.638 m)  Wt 244 lb (110.678 kg)  BMI 41.25 kg/m2  LMP 01/24/2014 GEN- NAD, alert and oriented x3 CVS- RRR, no murmur RESP-CTAB MSK- knee normal inspection bilat, no effusion bilat, GOOD ROM bilat, RIght knee mild TTP lateral aspect of knee, no bruising, no crepitus, ligaments in tact, Right ankle- TTP medial aspect of foot, +squeeze, test, ligaments in tact Gait non antalgic EXT- pedal edema Pulses- Radial 2+       Assessment & Plan:      Problem List Items Addressed This Visit   None    Visit Diagnoses   Knee pain    -  Primary    Relevant Orders    DG Knee Complete 4 Views Right    Pain in joint, ankle and foot        Relevant Orders       DG Foot Complete Right       Note: This dictation was prepared with Dragon dictation along with smaller phrase technology. Any transcriptional errors that result from this process are unintentional.

## 2014-02-13 NOTE — Assessment & Plan Note (Signed)
Ultram given, xray of knee and foot to be done No specific injury Work on weight loss

## 2014-02-19 ENCOUNTER — Ambulatory Visit (HOSPITAL_COMMUNITY)
Admission: RE | Admit: 2014-02-19 | Discharge: 2014-02-19 | Disposition: A | Discharge status: Home / Self Care | Payer: BC Managed Care – PPO | Source: Ambulatory Visit | Attending: Family Medicine | Admitting: Family Medicine

## 2014-02-19 DIAGNOSIS — M79609 Pain in unspecified limb: Secondary | ICD-10-CM | POA: Insufficient documentation

## 2014-02-19 DIAGNOSIS — M259 Joint disorder, unspecified: Secondary | ICD-10-CM | POA: Insufficient documentation

## 2014-02-19 DIAGNOSIS — M25579 Pain in unspecified ankle and joints of unspecified foot: Secondary | ICD-10-CM

## 2014-02-19 DIAGNOSIS — M25569 Pain in unspecified knee: Secondary | ICD-10-CM

## 2014-02-23 ENCOUNTER — Other Ambulatory Visit: Payer: BC Managed Care – PPO | Admitting: Adult Health

## 2014-03-01 ENCOUNTER — Encounter: Payer: Self-pay | Admitting: Adult Health

## 2014-03-01 ENCOUNTER — Other Ambulatory Visit (HOSPITAL_COMMUNITY)
Admission: RE | Admit: 2014-03-01 | Discharge: 2014-03-01 | Disposition: A | Discharge status: Home / Self Care | Payer: BC Managed Care – PPO | Source: Ambulatory Visit | Attending: Adult Health | Admitting: Adult Health

## 2014-03-01 ENCOUNTER — Ambulatory Visit (INDEPENDENT_AMBULATORY_CARE_PROVIDER_SITE_OTHER): Payer: BC Managed Care – PPO | Admitting: Adult Health

## 2014-03-01 VITALS — BP 150/80 | HR 76 | Ht 65.0 in | Wt 248.0 lb

## 2014-03-01 DIAGNOSIS — Z01419 Encounter for gynecological examination (general) (routine) without abnormal findings: Secondary | ICD-10-CM

## 2014-03-01 DIAGNOSIS — Z1151 Encounter for screening for human papillomavirus (HPV): Secondary | ICD-10-CM | POA: Insufficient documentation

## 2014-03-01 DIAGNOSIS — Z1212 Encounter for screening for malignant neoplasm of rectum: Secondary | ICD-10-CM

## 2014-03-01 LAB — HEMOCCULT GUIAC POC 1CARD (OFFICE): Fecal Occult Blood, POC: NEGATIVE

## 2014-03-01 NOTE — Progress Notes (Signed)
Patient ID: Ariel [name], female   DOB: 04/20/1971, 43 y.o.   MRN: [id number] History of Present Illness: Ariel [name] is a 43 year old black female,single in for pap and physical.   Current Medications, Allergies, Past Medical History, Past Surgical History, Family History and Social History were reviewed in [facility] electronic medical record.     Review of Systems: Patient denies any headaches, blurred vision, shortness of breath, chest pain, abdominal pain, problems with bowel movements, urination, or intercourse. Has pain heels has heel spur, no mood swings.    Physical Exam:BP 150/80  Pulse 76  Ht 5\' 5"  (1.651 m)  Wt 248 lb (112.492 kg)  BMI 41.27 kg/m2  LMP 01/24/2014 General:  Well developed, well nourished, no acute distress Skin:  Warm and dry Neck:  Midline trachea, normal thyroid Lungs; Clear to auscultation bilaterally Breast:  No dominant palpable mass, retraction, or nipple discharge Cardiovascular: Regular rate and rhythm Abdomen:  Soft, non tender, no hepatosplenomegaly Pelvic:  External genitalia is normal in appearance.  The vagina is normal in appearance.   The cervix is bulbous. Pap with HPV performed. Uterus is felt to be normal size, shape, and contour.  No                adnexal masses or tenderness noted.Has healing boil left cheek. Rectal: Good sphincter tone, no polyps, or hemorrhoids felt.  Hemoccult negative. Extremities:  No swelling or varicosities noted Psych:  No mood changes, alert and cooperative, seems happy   Impression: Yearly gyn exam    Plan: Physical in 1 year Mammogram yearly Colonoscopy at 49  Labs at PCP

## 2014-03-01 NOTE — Patient Instructions (Signed)
Physical in 1 year Mammogram yearly Colonoscopy at 49  Labs at PCP Plantar Fasciitis Plantar fasciitis is a common condition that causes foot pain. It is soreness (inflammation) of the band of tough fibrous tissue on the bottom of the foot that runs from the heel bone (calcaneus) to the ball of the foot. The cause of this soreness may be from excessive standing, poor fitting shoes, running on hard surfaces, being overweight, having an abnormal walk, or overuse (this is common in runners) of the painful foot or feet. It is also common in aerobic exercise dancers and ballet dancers. SYMPTOMS  Most people with plantar fasciitis complain of:  Severe pain in the morning on the bottom of their foot especially when taking the first steps out of bed. This pain recedes after a few minutes of walking.  Severe pain is experienced also during walking following a long period of inactivity.  Pain is worse when walking barefoot or up stairs DIAGNOSIS   Your caregiver will diagnose this condition by examining and feeling your foot.  Special tests such as X-rays of your foot, are usually not needed. PREVENTION   Consult a sports medicine professional before beginning a new exercise program.  Walking programs offer a good workout. With walking there is a lower chance of overuse injuries common to runners. There is less impact and less jarring of the joints.  Begin all new exercise programs slowly. If problems or pain develop, decrease the amount of time or distance until you are at a comfortable level.  Wear good shoes and replace them regularly.  Stretch your foot and the heel cords at the back of the ankle (Achilles tendon) both before and after exercise.  Run or exercise on even surfaces that are not hard. For example, asphalt is better than pavement.  Do not run barefoot on hard surfaces.  If using a treadmill, vary the incline.  Do not continue to workout if you have foot or joint problems.  Seek professional help if they do not improve. HOME CARE INSTRUCTIONS   Avoid activities that cause you pain until you recover.  Use ice or cold packs on the problem or painful areas after working out.  Only take over-the-counter or prescription medicines for pain, discomfort, or fever as directed by your caregiver.  Soft shoe inserts or athletic shoes with air or gel sole cushions may be helpful.  If problems continue or become more severe, consult a sports medicine caregiver or your own health care provider. Cortisone is a potent anti-inflammatory medication that may be injected into the painful area. You can discuss this treatment with your caregiver. MAKE SURE YOU:   Understand these instructions.  Will watch your condition.  Will get help right away if you are not doing well or get worse. Document Released: 06/23/2001 Document Revised: 12/21/2011 Document Reviewed: 08/22/2008 [facility] Patient Information 2014 [place], [place].

## 2014-03-13 ENCOUNTER — Ambulatory Visit (INDEPENDENT_AMBULATORY_CARE_PROVIDER_SITE_OTHER): Payer: BC Managed Care – PPO | Admitting: Family Medicine

## 2014-03-13 ENCOUNTER — Encounter: Payer: Self-pay | Admitting: Family Medicine

## 2014-03-13 VITALS — BP 150/88 | HR 76 | Temp 97.7°F | Resp 16 | Ht 66.0 in | Wt 244.0 lb

## 2014-03-13 DIAGNOSIS — I10 Essential (primary) hypertension: Secondary | ICD-10-CM

## 2014-03-13 MED ORDER — HYDROCHLOROTHIAZIDE 25 MG PO TABS: 25.0000 mg | ORAL_TABLET | Freq: Every day | ORAL | Status: DC

## 2014-03-13 NOTE — Progress Notes (Signed)
Patient ID: Ariel [name], female   DOB: 07-12-1971, 43 y.o.   MRN: [id number]   Subjective:    Patient ID: Ariel [name], female    DOB: 1970-12-17, 43 y.o.   MRN: [id number]  Patient presents for 4 week F/U and High BP- stopped taking Apidex  Patient here with concerns about her blood pressure. She's been checking her blood pressure at work her blood pressure has been in the 150s the bottom number has been 8790. She's not had any headache or dizziness associated. She's taken hydrochlorothiazide 12.5 mg once a day. I did review her chart she was seen at her GYN recently her blood pressure was elevated at that visit as well. Her foot pain and knee pain has improved therefore we are holding orthopedics. She did stop the phentermine to see if this is contributing to her blood pressure but there's been no change.  Review Of Systems:  GEN- denies fatigue, fever, weight loss,weakness, recent illness HEENT- denies eye drainage, change in vision, nasal discharge, CVS- denies chest pain, palpitations RESP- denies SOB, cough, wheeze MSK- +joint pain, muscle aches, injury Neuro- denies headache, dizziness, syncope, seizure activity       Objective:    BP 150/88  Pulse 76  Temp(Src) 97.7 F (36.5 C) (Oral)  Resp 16  Ht 5\' 6"  (1.676 m)  Wt 244 lb (110.678 kg)  BMI 39.40 kg/m2  LMP 03/08/2014 GEN- NAD, alert and oriented x3 HEENT-PERRL,.EOMI, non icetic pink conjunctiva CVS- RRR, no murmur RESP-CTAB EXT- No edema Pulses- Radial 2+        Assessment & Plan:      Problem List Items Addressed This Visit   Essential hypertension, benign - Primary   Relevant Medications      hydrochlorothiazide tablet      Note: This dictation was prepared with Dragon dictation along with smaller phrase technology. Any transcriptional errors that result from this process are unintentional.

## 2014-03-13 NOTE — Assessment & Plan Note (Signed)
Increase hydrochlorothiazide to 25 mg once a day increase water intake restart exercise program

## 2014-03-13 NOTE — Patient Instructions (Signed)
Increase HCTZ to 25mg  once a day  F/U 2 months for blood pressure/labs

## 2014-05-28 ENCOUNTER — Ambulatory Visit: Payer: BC Managed Care – PPO | Admitting: Family Medicine

## 2014-08-13 ENCOUNTER — Encounter: Payer: Self-pay | Admitting: Family Medicine

## 2015-04-17 ENCOUNTER — Encounter: Payer: Self-pay | Admitting: Family Medicine

## 2015-04-17 ENCOUNTER — Ambulatory Visit (INDEPENDENT_AMBULATORY_CARE_PROVIDER_SITE_OTHER): Payer: BLUE CROSS/BLUE SHIELD | Admitting: Family Medicine

## 2015-04-17 VITALS — BP 146/82 | HR 74 | Temp 98.1°F | Resp 16 | Ht 66.0 in | Wt 248.0 lb

## 2015-04-17 DIAGNOSIS — R7303 Prediabetes: Secondary | ICD-10-CM

## 2015-04-17 DIAGNOSIS — M7731 Calcaneal spur, right foot: Secondary | ICD-10-CM | POA: Diagnosis not present

## 2015-04-17 DIAGNOSIS — I10 Essential (primary) hypertension: Secondary | ICD-10-CM | POA: Diagnosis not present

## 2015-04-17 DIAGNOSIS — M7730 Calcaneal spur, unspecified foot: Secondary | ICD-10-CM | POA: Insufficient documentation

## 2015-04-17 DIAGNOSIS — R7309 Other abnormal glucose: Secondary | ICD-10-CM

## 2015-04-17 DIAGNOSIS — M722 Plantar fascial fibromatosis: Secondary | ICD-10-CM

## 2015-04-17 MED ORDER — DICLOFENAC SODIUM 75 MG PO TBEC: 75.0000 mg | DELAYED_RELEASE_TABLET | Freq: Two times a day (BID) | ORAL | Status: DC

## 2015-04-17 MED ORDER — TRAMADOL HCL 50 MG PO TABS
50.0000 mg | ORAL_TABLET | Freq: Four times a day (QID) | ORAL | Status: DC | PRN
Start: 2015-04-17 — End: 2015-09-12

## 2015-04-17 MED ORDER — HYDROCHLOROTHIAZIDE 25 MG PO TABS: 25.0000 mg | ORAL_TABLET | Freq: Every day | ORAL | Status: DC

## 2015-04-17 NOTE — Patient Instructions (Addendum)
Referral to podiatry Referral to bariatric clinic Restart HCTZ F/U 2 weeks on Monday 18th - for blood pressure and labs

## 2015-04-17 NOTE — Assessment & Plan Note (Signed)
Also query plantar fasciitis, send to podiatry for evaluation, has NSAIDS and ultram

## 2015-04-17 NOTE — Assessment & Plan Note (Signed)
Restart HCTZ, return in 2 weeks for recheck and fasting labs

## 2015-04-17 NOTE — Assessment & Plan Note (Signed)
Referral to gastric bypass clinic, has tried home diets, exercise, prescribed diet pills, risk factors, HTN, borderline DM

## 2015-04-17 NOTE — Progress Notes (Signed)
Patient ID: Ariel [name] T [place], female   DOB: 08/08/1971, 44 y.o.   MRN: [id number]   Subjective:    Patient ID: Ariel [name] T [place], female    DOB: 11/09/1970, 44 y.o.   MRN: [id number]  Patient presents for Medication Review/ Refill and Discuss Gastric Bypass  a chair to follow medications. She is last seen a year ago. She was on phentermine at that time she states that she lost weight with it but then regained it. She is interested in trying gastric bypass also look into the procedure and evaluation. She has tried other low-fat low-carb diets and always tends to gain her weight back.  Hypertension/leg swelling she's been out of her blood pressure medicine for many months  Continues to have bilateral foot pain she does have calcaneal spurs in the right foot. 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 She is out of her tramadol and the anti-inflammatory    Review Of Systems:  GEN- denies fatigue, fever, weight loss,weakness, recent illness HEENT- denies eye drainage, change in vision, nasal discharge, CVS- denies chest pain, palpitations RESP- denies SOB, cough, wheeze ABD- denies N/V, change in stools, abd pain GU- denies dysuria, hematuria, dribbling, incontinence MSK- + joint pain, muscle aches, injury Neuro- denies headache, dizziness, syncope, seizure activity       Objective:    BP 146/82 mmHg  Pulse 74  Temp(Src) 98.1 F (36.7 C) (Oral)  Resp 16  Ht 5\' 6"  (1.676 m)  Wt 248 lb (112.492 kg)  BMI 40.05 kg/m2  LMP 03/24/2015 (Within Days) GEN- NAD, alert and oriented x3 HEENT- PERRL, EOMI, non injected sclera, pink conjunctiva, MMM, oropharynx clear Neck- Supple, no thyromegaly CVS- RRR, no murmur RESP-CTAB MSK- bilat knee- normal inspection, good ROM, no effusion, no crepitus  TTP bilat heels/plantar insertion- left foot  EXT- pedal edema Pulses- Radial, DP- 2+        Assessment & Plan:      Problem List Items Addressed  This Visit    Obesity - Primary      Note: This dictation was prepared with Dragon dictation along with smaller phrase technology. Any transcriptional errors that result from this process are unintentional.

## 2015-04-17 NOTE — Assessment & Plan Note (Signed)
Recheck A1C at next visit

## 2015-05-02 ENCOUNTER — Encounter: Payer: Self-pay | Admitting: *Deleted

## 2015-05-06 ENCOUNTER — Ambulatory Visit: Payer: BLUE CROSS/BLUE SHIELD | Admitting: Family Medicine

## 2015-05-08 ENCOUNTER — Ambulatory Visit (INDEPENDENT_AMBULATORY_CARE_PROVIDER_SITE_OTHER): Payer: BLUE CROSS/BLUE SHIELD | Admitting: Family Medicine

## 2015-05-08 ENCOUNTER — Encounter: Payer: Self-pay | Admitting: Family Medicine

## 2015-05-08 VITALS — BP 130/88 | HR 76 | Temp 97.7°F | Resp 14 | Ht 66.0 in | Wt 248.0 lb

## 2015-05-08 DIAGNOSIS — I10 Essential (primary) hypertension: Secondary | ICD-10-CM | POA: Diagnosis not present

## 2015-05-08 DIAGNOSIS — R7303 Prediabetes: Secondary | ICD-10-CM

## 2015-05-08 DIAGNOSIS — R7309 Other abnormal glucose: Secondary | ICD-10-CM

## 2015-05-08 LAB — CBC WITH DIFFERENTIAL/PLATELET
Basophils Absolute: 0 10*3/uL (ref 0.0–0.1)
Basophils Relative: 0 % (ref 0–1)
Eosinophils Absolute: 0.1 10*3/uL (ref 0.0–0.7)
Eosinophils Relative: 3 % (ref 0–5)
HCT: 34.7 % — ABNORMAL LOW (ref 36.0–46.0)
HEMOGLOBIN: 11.4 g/dL — AB (ref 12.0–15.0)
LYMPHS PCT: 42 % (ref 12–46)
Lymphs Abs: 1.8 10*3/uL (ref 0.7–4.0)
MCH: 26 pg (ref 26.0–34.0)
MCHC: 32.9 g/dL (ref 30.0–36.0)
MCV: 79.2 fL (ref 78.0–100.0)
MONO ABS: 0.4 10*3/uL (ref 0.1–1.0)
MPV: 9.9 fL (ref 8.6–12.4)
Monocytes Relative: 9 % (ref 3–12)
Neutro Abs: 2 10*3/uL (ref 1.7–7.7)
Neutrophils Relative %: 46 % (ref 43–77)
PLATELETS: 291 10*3/uL (ref 150–400)
RBC: 4.38 MIL/uL (ref 3.87–5.11)
RDW: 14.7 % (ref 11.5–15.5)
WBC: 4.3 10*3/uL (ref 4.0–10.5)

## 2015-05-08 LAB — COMPREHENSIVE METABOLIC PANEL
ALT: 17 U/L (ref 6–29)
AST: 20 U/L (ref 10–35)
Albumin: 4.1 g/dL (ref 3.6–5.1)
Alkaline Phosphatase: 60 U/L (ref 33–115)
BILIRUBIN TOTAL: 0.5 mg/dL (ref 0.2–1.2)
BUN: 8 mg/dL (ref 7–25)
CO2: 25 mEq/L (ref 20–31)
Calcium: 8.9 mg/dL (ref 8.6–10.2)
Chloride: 105 mEq/L (ref 98–110)
Creat: 0.69 mg/dL (ref 0.50–1.10)
Glucose, Bld: 103 mg/dL — ABNORMAL HIGH (ref 70–99)
POTASSIUM: 3.7 meq/L (ref 3.5–5.3)
Sodium: 138 mEq/L (ref 135–146)
Total Protein: 7.2 g/dL (ref 6.1–8.1)

## 2015-05-08 LAB — LIPID PANEL
Cholesterol: 170 mg/dL (ref 125–200)
HDL: 36 mg/dL — ABNORMAL LOW (ref >=46)
LDL CALC: 111 mg/dL (ref <130)
Total CHOL/HDL Ratio: 4.7 Ratio (ref <=5.0)
Triglycerides: 117 mg/dL (ref <150)
VLDL: 23 mg/dL (ref <30)

## 2015-05-08 LAB — HEMOGLOBIN A1C
Hgb A1c MFr Bld: 6.5 % — ABNORMAL HIGH (ref <5.7)
Mean Plasma Glucose: 140 mg/dL — ABNORMAL HIGH (ref <117)

## 2015-05-08 LAB — TSH: TSH: 1.424 u[IU]/mL (ref 0.350–4.500)

## 2015-05-08 NOTE — Assessment & Plan Note (Signed)
Blood pressure much improved today continue hydrochlorothiazide 25 mg once a day

## 2015-05-08 NOTE — Patient Instructions (Signed)
We will call with lab results F/U 4 months  

## 2015-05-08 NOTE — Progress Notes (Signed)
Patient ID: Ariel [name], female   DOB: June 09, 1971, 44 y.o.   MRN: [id number]   Subjective:    Patient ID: Ariel [name], female    DOB: 03-14-1971, 44 y.o.   MRN: [id number]  Patient presents for 2 week F/U  patient here to follow-up medications. She restarted her blood pressure medicine she's no side effects with this. She is also doing well with regards to her plantar fasciitis she's back on her anti-inflammatory. She was seen by the bariatric clinic she had the seminar she will be meeting with the surgeon next week. She is due for fasting labs today    Review Of Systems:  GEN- denies fatigue, fever, weight loss,weakness, recent illness HEENT- denies eye drainage, change in vision, nasal discharge, CVS- denies chest pain, palpitations RESP- denies SOB, cough, wheeze ABD- denies N/V, change in stools, abd pain GU- denies dysuria, hematuria, dribbling, incontinence MSK- denies joint pain, muscle aches, injury Neuro- denies headache, dizziness, syncope, seizure activity       Objective:    BP 130/88 mmHg  Pulse 76  Temp(Src) 97.7 F (36.5 C) (Oral)  Resp 14  Ht  (1.676 m)  Wt 248 lb (112.492 kg)  BMI 40.05 kg/m2  LMP 03/24/2015 (Within Days) GEN- NAD, alert and oriented x3 CVS- RRR, no murmur RESP-CTAB EXT- No edema Pulses- Radial  2+        Assessment & Plan:      Problem List Items Addressed This Visit    None      Note: This dictation was prepared with Dragon dictation along with smaller phrase technology. Any transcriptional errors that result from this process are unintentional.

## 2015-05-10 ENCOUNTER — Other Ambulatory Visit: Payer: Self-pay | Admitting: *Deleted

## 2015-05-10 MED ORDER — METFORMIN HCL 500 MG PO TABS: 500.0000 mg | ORAL_TABLET | Freq: Every day | ORAL | Status: DC

## 2015-05-16 ENCOUNTER — Other Ambulatory Visit: Payer: Self-pay | Admitting: Surgery

## 2015-06-05 ENCOUNTER — Encounter: Payer: Self-pay | Admitting: Dietician

## 2015-06-05 ENCOUNTER — Encounter: Payer: BLUE CROSS/BLUE SHIELD | Attending: Surgery | Admitting: Dietician

## 2015-06-05 DIAGNOSIS — Z713 Dietary counseling and surveillance: Secondary | ICD-10-CM | POA: Insufficient documentation

## 2015-06-05 DIAGNOSIS — Z6841 Body Mass Index (BMI) 40.0 and over, adult: Secondary | ICD-10-CM | POA: Insufficient documentation

## 2015-06-05 NOTE — Progress Notes (Signed)
  Pre-Op Assessment Visit:  Pre-Operative Sleeve gastrectomy Surgery  Medical Nutrition Therapy:  Appt start time: 0925   End time:  0955  Patient was seen on 06/05/2015 for Pre-Operative Nutrition Assessment. Assessment and letter of approval faxed to [facility] Bariatric Surgery Program coordinator on 06/05/2015.   Preferred Learning Style:   No preference indicated   Learning Readiness:   Ready  Handouts given during visit include:  Pre-Op Goals Bariatric Surgery Protein Shakes   During the appointment today the following Pre-Op Goals were reviewed with the patient: Maintain or lose weight as instructed by your surgeon Make healthy food choices Begin to limit portion sizes Limited concentrated sugars and fried foods Keep fat/sugar in the single digits per serving on   food labels Practice CHEWING your food  (aim for 30 chews per bite or until applesauce consistency) Practice not drinking 15 minutes before, during, and 30 minutes after each meal/snack Avoid all carbonated beverages  Avoid/limit caffeinated beverages  Avoid all sugar-sweetened beverages Consume 3 meals per day; eat every 3-5 hours Make a list of non-food related activities Aim for 64-100 ounces of FLUID daily  Aim for at least 60-80 grams of PROTEIN daily Look for a liquid protein source that contain ?15 g protein and ?5 g carbohydrate  (ex: shakes, drinks, shots)  Patient-Centered Goals: -Ability to be more active (walking fast and sports) -Resolved knee pain and plantar fasciitis -Reduced medications -Prevention of diabetes and other health problems  Scale of 1-10: confidence (10) /importance (10)  Demonstrated degree of understanding via:  Teach Back  Teaching Method Utilized:  Visual Auditory Hands on  Barriers to learning/adherence to lifestyle change: none  [facility] to [facility] to enroll in Pre-Op and Post-Op Nutrition Education when  surgery date is scheduled.

## 2015-06-13 ENCOUNTER — Other Ambulatory Visit: Payer: Self-pay

## 2015-06-13 ENCOUNTER — Ambulatory Visit (HOSPITAL_COMMUNITY)
Admission: RE | Admit: 2015-06-13 | Discharge: 2015-06-13 | Disposition: A | Discharge status: Home / Self Care | Payer: BLUE CROSS/BLUE SHIELD | Source: Ambulatory Visit | Attending: Surgery | Admitting: Surgery

## 2015-06-13 ENCOUNTER — Encounter (INDEPENDENT_AMBULATORY_CARE_PROVIDER_SITE_OTHER): Payer: Self-pay

## 2015-06-13 DIAGNOSIS — K760 Fatty (change of) liver, not elsewhere classified: Secondary | ICD-10-CM | POA: Diagnosis not present

## 2015-06-13 DIAGNOSIS — Z6841 Body Mass Index (BMI) 40.0 and over, adult: Secondary | ICD-10-CM | POA: Diagnosis not present

## 2015-06-13 DIAGNOSIS — I10 Essential (primary) hypertension: Secondary | ICD-10-CM | POA: Diagnosis not present

## 2015-07-03 ENCOUNTER — Encounter: Payer: BLUE CROSS/BLUE SHIELD | Attending: Surgery | Admitting: Dietician

## 2015-07-03 ENCOUNTER — Encounter: Payer: Self-pay | Admitting: Dietician

## 2015-07-03 DIAGNOSIS — Z713 Dietary counseling and surveillance: Secondary | ICD-10-CM | POA: Diagnosis not present

## 2015-07-03 DIAGNOSIS — Z6841 Body Mass Index (BMI) 40.0 and over, adult: Secondary | ICD-10-CM | POA: Diagnosis not present

## 2015-07-03 NOTE — Progress Notes (Signed)
Supervised Weight loss:  Appt start time: 1000 end time:  1015.  SWL visit 1:  Primary concerns today: Ariel [name] returns today for her 1st SWL visit in preparation for sleeve gastrectomy having gained a few pounds. She has begun exercising; 2-mile hiking trail 3-4x a week and walking 1 mile in the evenings most days. Having trouble with plantar fasciitis. Started drinking a protein shake called Ump which meets protein and carb criteria. She mixes this with water and has this for breakfast instead of skipping. Cut back to 1 glass of tea per week. Also using a smaller plate.  Weight: 253.9 lbs BMI: 42.3  MEDICATIONS: see list  DIETARY INTAKE:  24-hr recall:  B ( AM): protein shake  Snk ( AM) :  L ( PM): fruit and hot dog  Snk ( PM): fruit or granola bar, sometimes chips D ( PM): liver, onions, and broccoli  Snk ( PM): fruit or chips  Recent physical activity: walking/hiking several times a week  Estimated energy needs: 1600-1800 calories  Progress Towards Goal(s):  In progress.   Nutritional Diagnosis:  [place]-3.3 Overweight/obesity related to past poor dietary habits and physical inactivity as evidenced by patient in SWL for pending bariatric surgery following dietary guidelines for continued weight loss.     Intervention:  Nutrition counseling provided.  -Try some sugar free flavorings for water -Try Powerade Zero -Keep high protein snacks on hand: jerky, chicken/egg/tuna salad, boiled eggs, Greek yogurt   -Continue exercise routine  -Continue replace breakfast with protein shake   Monitoring/Evaluation:  Dietary intake, exercise, and body weight in 4 week(s).

## 2015-07-03 NOTE — Patient Instructions (Addendum)
-  Try some sugar free flavorings for water -Try Powerade Zero -Keep high protein snacks on hand: jerky, chicken/egg/tuna salad, boiled eggs, Greek yogurt   -Continue exercise routine  -Continue replace breakfast with protein shake

## 2015-07-13 DIAGNOSIS — F410 Panic disorder [episodic paroxysmal anxiety] without agoraphobia: Secondary | ICD-10-CM

## 2015-07-13 HISTORY — DX: Panic disorder (episodic paroxysmal anxiety): F41.0

## 2015-07-29 ENCOUNTER — Encounter: Payer: Self-pay | Admitting: Family Medicine

## 2015-07-29 ENCOUNTER — Ambulatory Visit (INDEPENDENT_AMBULATORY_CARE_PROVIDER_SITE_OTHER): Payer: BLUE CROSS/BLUE SHIELD | Admitting: Family Medicine

## 2015-07-29 VITALS — BP 138/82 | HR 78 | Temp 98.0°F | Resp 16 | Ht 66.0 in | Wt 259.0 lb

## 2015-07-29 DIAGNOSIS — R002 Palpitations: Secondary | ICD-10-CM | POA: Diagnosis not present

## 2015-07-29 DIAGNOSIS — I10 Essential (primary) hypertension: Secondary | ICD-10-CM | POA: Diagnosis not present

## 2015-07-29 DIAGNOSIS — N912 Amenorrhea, unspecified: Secondary | ICD-10-CM

## 2015-07-29 LAB — CBC WITH DIFFERENTIAL/PLATELET
Basophils Absolute: 0 10*3/uL (ref 0.0–0.1)
Basophils Relative: 0 % (ref 0–1)
EOS PCT: 2 % (ref 0–5)
Eosinophils Absolute: 0.1 10*3/uL (ref 0.0–0.7)
HCT: 34.2 % — ABNORMAL LOW (ref 36.0–46.0)
HEMOGLOBIN: 11.7 g/dL — AB (ref 12.0–15.0)
LYMPHS ABS: 2.1 10*3/uL (ref 0.7–4.0)
Lymphocytes Relative: 32 % (ref 12–46)
MCH: 26.8 pg (ref 26.0–34.0)
MCHC: 34.2 g/dL (ref 30.0–36.0)
MCV: 78.3 fL (ref 78.0–100.0)
MONO ABS: 0.5 10*3/uL (ref 0.1–1.0)
MPV: 10 fL (ref 8.6–12.4)
Monocytes Relative: 8 % (ref 3–12)
NEUTROS ABS: 3.8 10*3/uL (ref 1.7–7.7)
Neutrophils Relative %: 58 % (ref 43–77)
Platelets: 302 10*3/uL (ref 150–400)
RBC: 4.37 MIL/uL (ref 3.87–5.11)
RDW: 14.2 % (ref 11.5–15.5)
WBC: 6.5 10*3/uL (ref 4.0–10.5)

## 2015-07-29 NOTE — Patient Instructions (Signed)
Okay to take the nexium, take 30 minutes before a meal Try the melatonin for sleep, start with 1mg   EKG is normal We will call with hormone labs  F/U as previous

## 2015-07-29 NOTE — Progress Notes (Signed)
Patient ID: Ariel ScrapeRita T [name], female   DOB: 12/12/1970, 44 y.o.   MRN: [id number]   Subjective:    Patient ID: Ariel ScrapeRita T [name], female    DOB: 07/17/1971, 44 y.o.   MRN: [id number]  Patient presents for Anxiety  Pt here with panic attacks, belching episodes that occur in her sleep. Past 2-3 days she has woken up feeling panicked in her sleep, denies chest pain, she has had hot flashes associated as well. Denies SOB, no recent illnesss. Denies any new stressors.  She has also been belching a lot, took Nexium today which helped.  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  She asked about menopause- she has not had a period in 2 months along with the hot flashes on and off during the day     Review Of Systems:  GEN- denies fatigue, fever, weight loss,weakness, recent illness HEENT- denies eye drainage, change in vision, nasal discharge, CVS- denies chest pain+, palpitations RESP- denies SOB, cough, wheeze ABD- denies N/V, change in stools, abd pain GU- denies dysuria, hematuria, dribbling, incontinence MSK- denies joint pain, muscle aches, injury Neuro- denies headache, dizziness, syncope, seizure activity       Objective:    BP 138/82 mmHg  Pulse 78  Temp(Src) 98 F (36.7 C) (Oral)  Resp 16  Ht 5\' 6"  (1.676 m)  Wt 259 lb (117.482 kg)  BMI 41.82 kg/m2  LMP  GEN- NAD, alert and oriented x3 HEENT- PERRL, EOMI, non injected sclera, pink conjunctiva, MMM, oropharynx clear Neck- Supple, no thyromegaly CVS- RRR, no murmur RESP-CTAB ABD-NABS,soft,NT,ND Psych- Normal affect and mood  EXT- No edema Pulses- Radial, DP- 2+  EKG- NSR, no ST changes      Assessment & Plan:      Problem List Items Addressed This Visit    Essential hypertension, benign - Primary   Relevant Orders   EKG 12-Lead (Completed)   CBC with Differential/Platelet    Other Visit Diagnoses    Amenorrhea        check FSH/LH, perimenopausal symptoms could be the hotflashes and missed cycle, not sexually active    Relevant Orders    Basic metabolic panel    FSH/LH    Palpitations        The palpitations and feeling of panic may be GERD related, or hormonal, doubt CAD normal EKG, No signs of anxiety, other is medication SE from taking Nyquil .  Check lytes, will give nexium samples to try       Note: This dictation was prepared with Dragon dictation along with smaller phrase technology. Any transcriptional errors that result from this process are unintentional.

## 2015-07-29 NOTE — Assessment & Plan Note (Signed)
Controlled,

## 2015-07-30 LAB — BASIC METABOLIC PANEL
BUN: 10 mg/dL (ref 7–25)
CO2: 27 mmol/L (ref 20–31)
Calcium: 9 mg/dL (ref 8.6–10.2)
Chloride: 103 mmol/L (ref 98–110)
Creat: 0.8 mg/dL (ref 0.50–1.10)
Glucose, Bld: 103 mg/dL — ABNORMAL HIGH (ref 70–99)
Potassium: 3.7 mmol/L (ref 3.5–5.3)
Sodium: 135 mmol/L (ref 135–146)

## 2015-07-30 LAB — FSH/LH
FSH: 11.9 m[IU]/mL
LH: 7.8 m[IU]/mL

## 2015-08-02 ENCOUNTER — Encounter: Payer: Self-pay | Admitting: Dietician

## 2015-08-02 ENCOUNTER — Encounter: Payer: BLUE CROSS/BLUE SHIELD | Attending: Surgery | Admitting: Dietician

## 2015-08-02 DIAGNOSIS — Z713 Dietary counseling and surveillance: Secondary | ICD-10-CM | POA: Insufficient documentation

## 2015-08-02 DIAGNOSIS — Z6841 Body Mass Index (BMI) 40.0 and over, adult: Secondary | ICD-10-CM | POA: Insufficient documentation

## 2015-08-02 NOTE — Progress Notes (Signed)
Supervised Weight loss:  Appt start time: 1025 end time:  1040.  SWL visit 2:  Primary concerns today: Ariel [name] returns today for her 1st SWL visit in preparation for sleeve gastrectomy having gained a 1 pound.  Still down to 1 tea a week. Has been working on chewing thoroughly. Also practicing not drinking while eating and finds this difficult. Still having protein drinks for breakfast. Got a fit bit and has been monitoring steps. She has been keeping high protein snacks on hand. Went to a bariatric support group and found it helpful.   Weight: 254.9 lbs BMI: 42.5  MEDICATIONS: see list  DIETARY INTAKE:  24-hr recall:  B ( AM): protein shake  Snk ( AM) :  L ( PM): fruit and hot dog  Snk ( PM): fruit or granola bar, sometimes chips D ( PM): liver, onions, and broccoli  Snk ( PM): fruit or chips  Recent physical activity: walking/hiking several times a week  Estimated energy needs: 1600-1800 calories  Progress Towards Goal(s):  In progress.   Nutritional Diagnosis:  [place]-3.3 Overweight/obesity related to past poor dietary habits and physical inactivity as evidenced by patient in SWL for pending bariatric surgery following dietary guidelines for continued weight loss.     Intervention:  Nutrition counseling provided.  -Try some sugar free flavorings for water -Try Powerade Zero -Keep high protein snacks on hand: jerky, chicken/egg/tuna salad, boiled eggs, Greek yogurt   -Continue exercise routine  -Continue replace breakfast with protein shake   Monitoring/Evaluation:  Dietary intake, exercise, and body weight in 4 week(s).

## 2015-08-02 NOTE — Patient Instructions (Signed)
-  Try some sugar free flavorings for water -Try Powerade Zero -Keep high protein snacks on hand: jerky, chicken/egg/tuna salad, boiled eggs, Greek yogurt   -Continue exercise routine  -Continue replace breakfast with protein shake   -See how many steps you get in a day on average, add 2000 and make that your goal

## 2015-08-30 ENCOUNTER — Encounter: Payer: BLUE CROSS/BLUE SHIELD | Attending: Surgery | Admitting: Dietician

## 2015-08-30 ENCOUNTER — Encounter: Payer: Self-pay | Admitting: Dietician

## 2015-08-30 DIAGNOSIS — Z713 Dietary counseling and surveillance: Secondary | ICD-10-CM | POA: Diagnosis not present

## 2015-08-30 DIAGNOSIS — Z6841 Body Mass Index (BMI) 40.0 and over, adult: Secondary | ICD-10-CM | POA: Diagnosis not present

## 2015-08-30 NOTE — Patient Instructions (Signed)
-  Try some sugar free flavorings for water -Try Powerade Zero -Keep high protein snacks on hand: jerky, chicken/egg/tuna salad, boiled eggs, Greek yogurt   -Continue exercise routine  -Continue replace breakfast with protein shake   -See how many steps you get in a day on average, add 2000 and make that your goal  -Start keeping a food log

## 2015-08-30 NOTE — Progress Notes (Signed)
Supervised Weight loss:  Appt start time: 1035 end time:  1050  SWL visit 3:  Primary concerns today: [place] SinkRita returns today for her 3rd SWL visit in preparation for sleeve gastrectomy having lost 2 pounds. Has added 2,000 steps to her daily average (getting a total of 12,000 steps a day). Tried Premier protein shakes and likes them. Still working on chewing thoroughly. Plans to start keeping a food journal. Still walking regularly.   Weight: 252.6 lbs BMI: 42.1  MEDICATIONS: see list  DIETARY INTAKE:  24-hr recall:  B ( AM): protein shake  Snk ( AM) :  L ( PM): fruit and hot dog  Snk ( PM): fruit or granola bar, sometimes chips D ( PM): liver, onions, and broccoli  Snk ( PM): fruit or chips  Recent physical activity: walking/hiking several times a week  Estimated energy needs: 1600-1800 calories  Progress Towards Goal(s):  In progress.   Nutritional Diagnosis:  [place]-3.3 Overweight/obesity related to past poor dietary habits and physical inactivity as evidenced by patient in SWL for pending bariatric surgery following dietary guidelines for continued weight loss.     Intervention:  Nutrition counseling provided.  -Try some sugar free flavorings for water -Try Powerade Zero -Keep high protein snacks on hand: jerky, chicken/egg/tuna salad, boiled eggs, Greek yogurt   -Continue exercise routine  -Continue replace breakfast with protein shake   Monitoring/Evaluation:  Dietary intake, exercise, and body weight in 4 week(s).

## 2015-09-04 ENCOUNTER — Other Ambulatory Visit: Payer: Self-pay | Admitting: Family Medicine

## 2015-09-04 DIAGNOSIS — Z1231 Encounter for screening mammogram for malignant neoplasm of breast: Secondary | ICD-10-CM

## 2015-09-11 ENCOUNTER — Ambulatory Visit (HOSPITAL_COMMUNITY)
Admission: RE | Admit: 2015-09-11 | Discharge: 2015-09-11 | Disposition: A | Discharge status: Home / Self Care | Payer: BLUE CROSS/BLUE SHIELD | Source: Ambulatory Visit | Attending: Family Medicine | Admitting: Family Medicine

## 2015-09-11 ENCOUNTER — Ambulatory Visit (INDEPENDENT_AMBULATORY_CARE_PROVIDER_SITE_OTHER): Payer: BLUE CROSS/BLUE SHIELD | Admitting: Family Medicine

## 2015-09-11 ENCOUNTER — Encounter: Payer: Self-pay | Admitting: Family Medicine

## 2015-09-11 ENCOUNTER — Ambulatory Visit (HOSPITAL_COMMUNITY): Payer: BLUE CROSS/BLUE SHIELD

## 2015-09-11 VITALS — BP 140/84 | HR 78 | Temp 98.1°F | Resp 14 | Ht 66.0 in | Wt 252.0 lb

## 2015-09-11 DIAGNOSIS — Z23 Encounter for immunization: Secondary | ICD-10-CM

## 2015-09-11 DIAGNOSIS — Z1231 Encounter for screening mammogram for malignant neoplasm of breast: Secondary | ICD-10-CM | POA: Insufficient documentation

## 2015-09-11 DIAGNOSIS — R7303 Prediabetes: Secondary | ICD-10-CM

## 2015-09-11 DIAGNOSIS — I10 Essential (primary) hypertension: Secondary | ICD-10-CM

## 2015-09-11 DIAGNOSIS — K219 Gastro-esophageal reflux disease without esophagitis: Secondary | ICD-10-CM | POA: Diagnosis not present

## 2015-09-11 LAB — BASIC METABOLIC PANEL
BUN: 11 mg/dL (ref 7–25)
CHLORIDE: 97 mmol/L — AB (ref 98–110)
CO2: 27 mmol/L (ref 20–31)
CREATININE: 0.71 mg/dL (ref 0.50–1.10)
Calcium: 9.2 mg/dL (ref 8.6–10.2)
Glucose, Bld: 144 mg/dL — ABNORMAL HIGH (ref 70–99)
Potassium: 3.8 mmol/L (ref 3.5–5.3)
Sodium: 136 mmol/L (ref 135–146)

## 2015-09-11 LAB — HEMOGLOBIN A1C
Hgb A1c MFr Bld: 6.6 % — ABNORMAL HIGH (ref <5.7)
MEAN PLASMA GLUCOSE: 143 mg/dL — AB (ref <117)

## 2015-09-11 NOTE — Patient Instructions (Signed)
Continue current medications Take one a day for females Flu shot  F/U 4 months

## 2015-09-11 NOTE — Assessment & Plan Note (Signed)
Controlled with Nexium

## 2015-09-11 NOTE — Assessment & Plan Note (Signed)
She is maintaining a 7lbs weight loss Difficulty with MTF with cramping and then loose stool after taking. I will recheck her A1c today. Pending results we will likely discontinue the metformin to see if he needs to be replaced with something different.

## 2015-09-11 NOTE — Assessment & Plan Note (Signed)
Routine follow-up with nutritionist as well as the bariatric clinic.

## 2015-09-11 NOTE — Assessment & Plan Note (Addendum)
Blood pressure minimally elevated today however she was anxious about getting her mammogram. No change hydrochlorothiazide With regards to her memory I don't see any significant changes with her memory. We will just monitor for now. I do recommend that she take a multivitamin once a day

## 2015-09-11 NOTE — Progress Notes (Signed)
Patient ID: Ariel [name], female   DOB: 01/11/1971, 44 y.o.   MRN: [id number]   Subjective:    Patient ID: Ariel ScrapeRita T [name], female    DOB: 08/08/1971, 44 y.o.   MRN: [id number]  Patient presents for 4 month F/U  A she here for follow-up on chronic medical problems. She is in the process of getting bariatric surgery. She is following with the nutritionist but has not lost any significant amount of weight. She is prediabetic as well as hypertensive. At her last visit she had some palpitations and also some irregular cycles. Her hormone last were checked which placed her in the menopausal region. She was also having some GI upset not put her on Nexium. This has helped.  Prediabetes she's currently on metformin forming her last A1c was 6.5% in July  She is also concerned that her memory which change in. She states that occasionally her kids stated she does not remember when she said something. She denies difficulty pain bills denies missing important dates. She still managing her household without any difficulties. No family history of early dementia.  Review Of Systems:  GEN- denies fatigue, fever, weight loss,weakness, recent illness HEENT- denies eye drainage, change in vision, nasal discharge, CVS- denies chest pain, palpitations RESP- denies SOB, cough, wheeze ABD- denies N/V, change in stools, abd pain GU- denies dysuria, hematuria, dribbling, incontinence MSK- denies joint pain, muscle aches, injury Neuro- denies headache, dizziness, syncope, seizure activity       Objective:    BP 140/84 mmHg  Pulse 78  Temp(Src) 98.1 F (36.7 C) (Oral)  Resp 14  Ht 5\' 6"  (1.676 m)  Wt 252 lb (114.306 kg)  BMI 40.69 kg/m2  LMP 08/26/2015 GEN- NAD, alert and oriented x3 HEENT- PERRL, EOMI, non injected sclera, pink conjunctiva, MMM, oropharynx clear CVS- RRR, no murmur RESP-CTAB EXT- No edema Psych- normal affect and mood, short term memory in tact, normal mental status Pulses-  Radial2+        Assessment & Plan:      Problem List Items Addressed This Visit    Prediabetes    She is maintaining a 7lbs weight loss Difficulty with MTF with cramping and then loose stool after taking. I will recheck her A1c today. Pending results we will likely discontinue the metformin to see if he needs to be replaced with something different.      Relevant Orders   Basic metabolic panel   Hemoglobin A1c   Morbid obesity (HCC)    Routine follow-up with nutritionist as well as the bariatric clinic.      GERD (gastroesophageal reflux disease)    Controlled with Nexium      Essential hypertension, benign - Primary    Blood pressure minimally elevated today however she was anxious about getting her mammogram. No change hydrochlorothiazide With regards to her memory I don't see any significant changes with her memory. We will just monitor for now. I do recommend that she take a multivitamin once a day       Other Visit Diagnoses    Need for prophylactic vaccination and inoculation against influenza        Relevant Orders    Flu Vaccine QUAD 36+ mos PF IM (Fluarix & Fluzone Quad PF) (Completed)       Note: This dictation was prepared with Dragon dictation along with smaller phrase technology. Any transcriptional errors that result from this process are unintentional.

## 2015-09-12 ENCOUNTER — Ambulatory Visit (INDEPENDENT_AMBULATORY_CARE_PROVIDER_SITE_OTHER): Payer: BLUE CROSS/BLUE SHIELD | Admitting: Adult Health

## 2015-09-12 ENCOUNTER — Encounter: Payer: Self-pay | Admitting: Adult Health

## 2015-09-12 VITALS — BP 144/80 | HR 68 | Ht 65.0 in | Wt 255.5 lb

## 2015-09-12 DIAGNOSIS — Z01419 Encounter for gynecological examination (general) (routine) without abnormal findings: Secondary | ICD-10-CM | POA: Diagnosis not present

## 2015-09-12 DIAGNOSIS — N951 Menopausal and female climacteric states: Secondary | ICD-10-CM | POA: Insufficient documentation

## 2015-09-12 DIAGNOSIS — Z1211 Encounter for screening for malignant neoplasm of colon: Secondary | ICD-10-CM

## 2015-09-12 DIAGNOSIS — L0232 Furuncle of buttock: Secondary | ICD-10-CM

## 2015-09-12 HISTORY — DX: Furuncle of buttock: L02.32

## 2015-09-12 LAB — HEMOCCULT GUIAC POC 1CARD (OFFICE): Fecal Occult Blood, POC: NEGATIVE

## 2015-09-12 MED ORDER — SULFAMETHOXAZOLE-TRIMETHOPRIM 800-160 MG PO TABS: 1.0000 | ORAL_TABLET | Freq: Two times a day (BID) | ORAL | Status: DC

## 2015-09-12 NOTE — Patient Instructions (Addendum)
Physical in 1 year Take septra ds 1 bid x 14 days Recheck boil in 2 weeks Mammogram yearly Abscess An abscess is an infected area that contains a collection of pus and debris.It can occur in almost any part of the body. An abscess is also known as a furuncle or boil. CAUSES  An abscess occurs when tissue gets infected. This can occur from blockage of oil or sweat glands, infection of hair follicles, or a minor injury to the skin. As the body tries to fight the infection, pus collects in the area and creates pressure under the skin. This pressure causes pain. People with weakened immune systems have difficulty fighting infections and get certain abscesses more often.  SYMPTOMS Usually an abscess develops on the skin and becomes a painful mass that is red, warm, and tender. If the abscess forms under the skin, you may feel a moveable soft area under the skin. Some abscesses break open (rupture) on their own, but most will continue to get worse without care. The infection can spread deeper into the body and eventually into the bloodstream, causing you to feel ill.  DIAGNOSIS  Your caregiver will take your medical history and perform a physical exam. A sample of fluid may also be taken from the abscess to determine what is causing your infection. TREATMENT  Your caregiver may prescribe antibiotic medicines to fight the infection. However, taking antibiotics alone usually does not cure an abscess. Your caregiver may need to make a small cut (incision) in the abscess to drain the pus. In some cases, gauze is packed into the abscess to reduce pain and to continue draining the area. HOME CARE INSTRUCTIONS   Only take over-the-counter or prescription medicines for pain, discomfort, or fever as directed by your caregiver.  If you were prescribed antibiotics, take them as directed. Finish them even if you start to feel better.  If gauze is used, follow your caregiver's directions for changing the  gauze.  To avoid spreading the infection:  Keep your draining abscess covered with a bandage.  Wash your hands well.  Do not share personal care items, towels, or whirlpools with others.  Avoid skin contact with others.  Keep your skin and clothes clean around the abscess.  Keep all follow-up appointments as directed by your caregiver. SEEK MEDICAL CARE IF:   You have increased pain, swelling, redness, fluid drainage, or bleeding.  You have muscle aches, chills, or a general ill feeling.  You have a fever. MAKE SURE YOU:   Understand these instructions.  Will watch your condition.  Will get help right away if you are not doing well or get worse.   This information is not intended to replace advice given to you by your health care provider. Make sure you discuss any questions you have with your health care provider.   Document Released: 07/08/2005 Document Revised: 03/29/2012 Document Reviewed: 12/11/2011 Elsevier Interactive Patient Education [employer]2016 Elsevier Inc.

## 2015-09-12 NOTE — Progress Notes (Signed)
Patient ID: Ariel [name], female   DOB: 04/18/1971, 44 y.o.   MRN: [id number] History of Present Illness: [place] SinkRita is a 44 year old black female, in for a well woman gyn exam,she had a normal pap 03/01/14 with negative HPV.She is hoping to have sleeve surgery soon.Has ?boil left buttock,she says it goes and comes and will bleed at times.She has started skipping periods and has hot flashes but FSH is normal. PCP is Dr Jeanice Limurham  Current Medications, Allergies, Past Medical History, Past Surgical History, Family History and Social History were reviewed in [facility] electronic medical record.     Review of Systems:  Patient denies any headaches, hearing loss, fatigue, blurred vision, shortness of breath, chest pain, abdominal pain, problems with bowel movements, urination, or intercourse(not having sex). No joint pain or mood swings.See HPI for positives.   Physical Exam:BP 144/80 mmHg  Pulse 68  Ht 5\' 5"  (1.651 m)  Wt 255 lb 8 oz (115.894 kg)  BMI 42.52 kg/m2  LMP 08/26/2015 General:  Well developed, well nourished, no acute distress Skin:  Warm and dry Neck:  Midline trachea, normal thyroid, good ROM, no lymphadenopathy Lungs; Clear to auscultation bilaterally Breast:  No dominant palpable mass, retraction, or nipple discharge Cardiovascular: Regular rate and rhythm Abdomen:  Soft, non tender, no hepatosplenomegaly Pelvic:  External genitalia is normal in appearance, no lesions.  The vagina is normal in appearance. Urethra has no lesions or masses. The cervix is bulbous.  Uterus is felt to be normal size, shape, and contour.  No adnexal masses or tenderness noted.Bladder is non tender, no masses felt. Rectal: Good sphincter tone, no polyps, or hemorrhoids felt.  Hemoccult negative.Has nickel sized area left buttock that is red and has white area in center. Extremities/musculoskeletal:  No swelling or varicosities noted, no clubbing or cyanosis Psych:  No mood changes, alert and  cooperative,seems happy Discussed that she is peri menopausal and some of its symptoms.   Impression: Well woman gyn exam no pap Boil left buttock Peri menopausal    Plan: Rx septra ds 1 bid x 14 days,#28 no refills Use dial soap Return in 2 weeks to recheck,may need I&D Physical in 1 year Mammogram yearly,had normal one today Labs with PCP

## 2015-09-13 ENCOUNTER — Other Ambulatory Visit: Payer: Self-pay | Admitting: *Deleted

## 2015-09-13 MED ORDER — GLIPIZIDE 5 MG PO TABS: 5.0000 mg | ORAL_TABLET | Freq: Every day | ORAL | Status: DC

## 2015-09-26 ENCOUNTER — Ambulatory Visit (INDEPENDENT_AMBULATORY_CARE_PROVIDER_SITE_OTHER): Payer: BLUE CROSS/BLUE SHIELD | Admitting: Adult Health

## 2015-09-26 ENCOUNTER — Encounter: Payer: Self-pay | Admitting: Dietician

## 2015-09-26 ENCOUNTER — Encounter: Payer: BLUE CROSS/BLUE SHIELD | Attending: Surgery | Admitting: Dietician

## 2015-09-26 ENCOUNTER — Encounter: Payer: Self-pay | Admitting: Adult Health

## 2015-09-26 VITALS — BP 140/80 | HR 90 | Ht 65.5 in | Wt 258.0 lb

## 2015-09-26 DIAGNOSIS — L0232 Furuncle of buttock: Secondary | ICD-10-CM

## 2015-09-26 DIAGNOSIS — Z713 Dietary counseling and surveillance: Secondary | ICD-10-CM | POA: Insufficient documentation

## 2015-09-26 DIAGNOSIS — Z6841 Body Mass Index (BMI) 40.0 and over, adult: Secondary | ICD-10-CM | POA: Insufficient documentation

## 2015-09-26 NOTE — Patient Instructions (Signed)
-  Try some sugar free flavorings for water -Try Powerade Zero -Keep high protein snacks on hand: jerky, chicken/egg/tuna salad, boiled eggs, Greek yogurt   -Continue exercise routine  -Continue replace breakfast with protein shake   -See how many steps you get in a day on average, add 2000 and make that your goal  -Start keeping a food log -Try eating protein foods first, then veggies, then starch

## 2015-09-26 NOTE — Progress Notes (Signed)
Subjective:     Patient ID: Ariel [name], female   DOB: 08/21/1971, 44 y.o.   MRN: [id number]  HPI Ariel [name] is a 44 year old black female back in follow up of boil left buttock, is taking septra and feels much better.   Review of Systems Patient denies any headaches, hearing loss, fatigue, blurred vision, shortness of breath, chest pain, abdominal pain, problems with bowel movements, urination, or intercourse. No joint pain or mood swings. Reviewed past medical,surgical, social and family history. Reviewed medications and allergies.     Objective:   Physical Exam BP 140/80 mmHg  Pulse 90  Ht 5' 5.5" (1.664 m)  Wt 258 lb (117.028 kg)  BMI 42.27 kg/m2  LMP 08/26/2015 Skin warm and dry, boil left buttock is resolving, non tender, still draining small amount creamy fluid, no odor, no tracking on rectal exam    Assessment:       Boil buttock   Plan:     Finish septra ds Follow up in 3 weeks

## 2015-09-26 NOTE — Progress Notes (Signed)
Supervised Weight loss:  Appt start time: 1150 end time:  1205  SWL visit 4:  Primary concerns today: Ariel [name] returns today for her 4th SWL visit in preparation for sleeve gastrectomy having lost 2 pounds. Has added 2,000 steps to her daily average (getting a total of 12,000 steps a day). Tried Premier protein shakes and likes them. Still working on chewing thoroughly. Plans to start keeping a food journal. Still walking regularly.   She states that she is feeling more prepared for surgery. Still working on not drinking while eating. She has been working on eating slowly and has been using a smaller plate and this is helping with portion sizes.   Weight: 259.4 lbs BMI: 43.3  MEDICATIONS: see list  DIETARY INTAKE:  24-hr recall:  B ( AM): protein shake  Snk ( AM) :  L ( PM): fruit and hot dog  Snk ( PM): fruit or granola bar, sometimes chips D ( PM): liver, onions, and broccoli  Snk ( PM): fruit or chips  Recent physical activity: walking/hiking several times a week  Estimated energy needs: 1600-1800 calories  Progress Towards Goal(s):  In progress.   Nutritional Diagnosis:  [place]-3.3 Overweight/obesity related to past poor dietary habits and physical inactivity as evidenced by patient in SWL for pending bariatric surgery following dietary guidelines for continued weight loss.     Intervention:  Nutrition counseling provided. -Try some sugar free flavorings for water -Try Powerade Zero -Keep high protein snacks on hand: jerky, chicken/egg/tuna salad, boiled eggs, Greek yogurt  -Continue exercise routine  -Continue replace breakfast with protein shake  -See how many steps you get in a day on average, add 2000 and make that your goal -Start keeping a food log -Try eating protein foods first, then veggies, then starch  Monitoring/Evaluation:  Dietary intake, exercise, and body weight in 4 week(s).

## 2015-09-26 NOTE — Patient Instructions (Signed)
Finish septra ds Follow up in 3 weeks

## 2015-10-18 ENCOUNTER — Encounter: Payer: Self-pay | Admitting: Adult Health

## 2015-10-18 ENCOUNTER — Ambulatory Visit (INDEPENDENT_AMBULATORY_CARE_PROVIDER_SITE_OTHER): Payer: BLUE CROSS/BLUE SHIELD | Admitting: Adult Health

## 2015-10-18 VITALS — BP 148/80 | HR 78 | Ht 66.0 in | Wt 259.0 lb

## 2015-10-18 DIAGNOSIS — L0232 Furuncle of buttock: Secondary | ICD-10-CM

## 2015-10-18 MED ORDER — METRONIDAZOLE 500 MG PO TABS: 500.0000 mg | ORAL_TABLET | Freq: Two times a day (BID) | ORAL | Status: DC

## 2015-10-18 MED ORDER — CIPROFLOXACIN HCL 500 MG PO TABS: 500.0000 mg | ORAL_TABLET | Freq: Two times a day (BID) | ORAL | Status: DC

## 2015-10-18 NOTE — Progress Notes (Addendum)
Subjective:     Patient ID: Ariel [name], female   DOB: 08/13/1971, 45 y.o.   MRN: [id number]  HPI [place] SinkRita is a 45 year old black female back in follow up of treating boil left buttock, still there,but may be better,except during period. Was first treated 12/1 with septra ds and then checked 12/15 and it was better.Has diabetes and is on meds.  Review of Systems Patient denies any headaches, hearing loss, fatigue, blurred vision, shortness of breath, chest pain, abdominal pain, problems with bowel movements, urination, or intercourse. No joint pain or mood swings.Boil still present Reviewed past medical,surgical, social and family history. Reviewed medications and allergies.     Objective:   Physical Exam BP 148/80 mmHg  Pulse 78  Ht 5\' 6"  (1.676 m)  Wt 259 lb (117.482 kg)  BMI 41.82 kg/m2  LMP 12/27/2016Has 3 cm area left buttock that persists, no drainage now and is not tender, but seemed worse with period, Dr Despina HiddenEure in for co exam due to non resolution, he is concerned for possible inflammatory process, IBD, will get CT and give cipro and flagyl for 2 weeks and get surgical consult with Dr Lovell SheehanJenkins.   Face time 15 minutes.   Assessment:   Abscess left buttock    Plan:     Refer to Dr Franky MachoMark Jenkins for surgical consult CT abdomen with contrast 1/10 [facility] Rx flagyl 500 mg #28 take 1 bid x 14 days Rx cipro 500 mg #28 take 1 bid x 14 days

## 2015-10-18 NOTE — Patient Instructions (Signed)
[facility] Take cipro and flagyl no alcohol Referred to Dr Lovell SheehanJenkins

## 2015-10-21 ENCOUNTER — Other Ambulatory Visit: Payer: Self-pay | Admitting: Adult Health

## 2015-10-21 DIAGNOSIS — L0231 Cutaneous abscess of buttock: Secondary | ICD-10-CM

## 2015-10-22 ENCOUNTER — Ambulatory Visit (HOSPITAL_COMMUNITY)
Admission: RE | Admit: 2015-10-22 | Discharge: 2015-10-22 | Disposition: A | Discharge status: Home / Self Care | Payer: BLUE CROSS/BLUE SHIELD | Source: Ambulatory Visit | Attending: Adult Health | Admitting: Adult Health

## 2015-10-22 ENCOUNTER — Ambulatory Visit (HOSPITAL_COMMUNITY): Admission: RE | Admit: 2015-10-22 | Payer: BLUE CROSS/BLUE SHIELD | Source: Ambulatory Visit

## 2015-10-22 DIAGNOSIS — K760 Fatty (change of) liver, not elsewhere classified: Secondary | ICD-10-CM | POA: Diagnosis not present

## 2015-10-22 DIAGNOSIS — L0231 Cutaneous abscess of buttock: Secondary | ICD-10-CM | POA: Diagnosis not present

## 2015-10-22 DIAGNOSIS — N852 Hypertrophy of uterus: Secondary | ICD-10-CM | POA: Insufficient documentation

## 2015-10-22 DIAGNOSIS — N200 Calculus of kidney: Secondary | ICD-10-CM | POA: Insufficient documentation

## 2015-10-22 DIAGNOSIS — R590 Localized enlarged lymph nodes: Secondary | ICD-10-CM | POA: Diagnosis not present

## 2015-10-22 MED ORDER — IOHEXOL 300 MG/ML  SOLN
100.0000 mL | Freq: Once | INTRAMUSCULAR | Status: AC | PRN
  Administered 2015-10-22: 100 mL via INTRAVENOUS

## 2015-10-23 ENCOUNTER — Telehealth: Payer: Self-pay | Admitting: Adult Health

## 2015-10-23 NOTE — Telephone Encounter (Signed)
Pt aware of CT results. 

## 2015-10-29 ENCOUNTER — Encounter: Payer: BLUE CROSS/BLUE SHIELD | Attending: Surgery | Admitting: Skilled Nursing Facility1

## 2015-10-29 ENCOUNTER — Encounter: Payer: Self-pay | Admitting: Skilled Nursing Facility1

## 2015-10-29 DIAGNOSIS — Z6841 Body Mass Index (BMI) 40.0 and over, adult: Secondary | ICD-10-CM | POA: Insufficient documentation

## 2015-10-29 DIAGNOSIS — Z713 Dietary counseling and surveillance: Secondary | ICD-10-CM | POA: Diagnosis not present

## 2015-10-29 NOTE — Progress Notes (Signed)
Supervised Weight loss:  Appt start time: 10:45 end time:  11:00  SWL visit 6:  Primary concerns today: Ariel [name] returns today for her 6th SWL visit in preparation for sleeve gastrectomy. Pt states this is her sixth appointment because one of her physicians appointments counted towards her SWL appointments. Pt states she has been drinking [employer] copy protein shakes but has grown tired of them so she has been trying other options. Pt states she is Still working on chewing thoroughly and states it has been a difficult behavior to implement. Pt states she has been keeping a food journal and before thought she did not eat very much but then after reading her journal she realized she was eating a lot in the middle of the night from being used to working 3rd shift. 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 Pt states she has had a reoccurring boil on her flank and is currently taking antibiotics. Pt states she forgot to state she has tried phentermine ion 2009 where she lost 20-30 pounds after 6 months and then gained it back in a year. Pt states now that the surgery is looming near she is getting a little nervous. Pt states she is still working on drinking more water and not during meals. Pt states to stop eating in the middle of the night she keeps water by her bed and bought yogurt to eat instead. Pt states she is down to 2 sweet teas a week. Pt questions why with all these changes she has not lost wt-dietitian described possible reasons.  Dietitian will send the neccessary communication now that pt has completed her SWL.  Weight: 261 lbs BMI: 42.2  MEDICATIONS: see list  DIETARY INTAKE:  24-hr recall:  B ( AM): protein shake  Snk ( AM) :  L ( PM): fruit and hot dog  Snk ( PM): fruit or granola bar, sometimes chips D ( PM): liver, onions, and broccoli  Snk ( PM): fruit or chips  Recent physical activity: walking 4 days for 30 minutes  Estimated  energy needs: 1600-1800 calories  Progress Towards Goal(s):  In progress.   Nutritional Diagnosis:  [place]-3.3 Overweight/obesity related to past poor dietary habits and physical inactivity as evidenced by patient in SWL for pending bariatric surgery following dietary guidelines for continued weight loss.     Intervention:  Nutrition counseling provided. -Keep high protein snacks on hand: jerky, chicken/egg/tuna salad, boiled eggs, Greek yogurt  -Continue exercise routine  -Continue replace breakfast with protein shake  -Keep your food log -Try eating protein foods first, then veggies, then starch  Monitoring/Evaluation:  Dietary intake, exercise, and body weight in 4 week(s).

## 2015-11-14 ENCOUNTER — Ambulatory Visit (HOSPITAL_BASED_OUTPATIENT_CLINIC_OR_DEPARTMENT_OTHER): Payer: BLUE CROSS/BLUE SHIELD | Attending: Surgery

## 2015-11-14 DIAGNOSIS — Z6841 Body Mass Index (BMI) 40.0 and over, adult: Secondary | ICD-10-CM | POA: Insufficient documentation

## 2015-11-14 DIAGNOSIS — R0683 Snoring: Secondary | ICD-10-CM | POA: Insufficient documentation

## 2015-11-17 DIAGNOSIS — R0683 Snoring: Secondary | ICD-10-CM | POA: Diagnosis not present

## 2015-11-17 NOTE — Progress Notes (Signed)
  Patient Name: Ariel [name], [place] Date: 11/14/2015 Gender: Female D.O.B: 02-Mar-1971 Age (years): 45 Referring Provider: Ovidio Kin Height (inches): 66 Interpreting Physician: Jetty Duhamel MD, ABSM Weight (lbs): 259 RPSGT: Feliberto Harts BMI: 42 MRN: [id number] Neck Size: 14.00 CLINICAL INFORMATION Sleep Study Type: NPSG Indication for sleep study: Obesity, Snoring Epworth Sleepiness Score:  SLEEP STUDY TECHNIQUE As per the AASM Manual for the Scoring of Sleep and Associated Events v2.3 (April 2016) with a hypopnea requiring 4% desaturations. The channels recorded and monitored were frontal, central and occipital EEG, electrooculogram (EOG), submentalis EMG (chin), nasal and oral airflow, thoracic and abdominal wall motion, anterior tibialis EMG, snore microphone, electrocardiogram, and pulse oximetry.  MEDICATIONS Patient's medications include: charted for review. Medications self-administered by patient during sleep study : No sleep medicine administered.  SLEEP ARCHITECTURE The study was initiated at 10:29:15 PM and ended at 5:00:45 AM. Sleep onset time was 3.6 minutes and the sleep efficiency was 66.8%. The total sleep time was 261.5 minutes. Stage REM latency was 133.0 minutes. The patient spent 4.78% of the night in stage N1 sleep, 78.78% in stage N2 sleep, 9.56% in stage N3 and 6.88% in REM. Alpha intrusion was absent. Supine sleep was 90.43%. Wake after sleep onset 126 minutes  RESPIRATORY PARAMETERS The overall apnea/hypopnea index (AHI) was 1.8 per hour. There were 0 total apneas, including 0 obstructive, 0 central and 0 mixed apneas. There were 8 hypopneas and 5 RERAs. The AHI during Stage REM sleep was 10.0 per hour. AHI while supine was 2.0 per hour. The mean oxygen saturation was 95.53%. The minimum SpO2 during sleep was 90.00%. Loud snoring was noted during this study.  CARDIAC DATA The 2 lead EKG demonstrated sinus rhythm. The mean heart rate was  83.71 beats per minute. Other EKG findings include: None.  LEG MOVEMENT DATA The total PLMS were 0 with a resulting PLMS index of 0.00. Associated arousal with leg movement index was 0.0 .  IMPRESSIONS - No significant obstructive sleep apnea occurred during this study (AHI = 1.8/h). - No significant central sleep apnea occurred during this study (CAI = 0.0/h). - The patient had minimal or no oxygen desaturation during the study (Min O2 = 90.00%) - The patient snored with Loud snoring volume. - No cardiac abnormalities were noted during this study. - Clinically significant periodic limb movements did not occur during sleep. No significant associated arousals.  DIAGNOSIS - Normal study - Primary Snoring (786.09 [R06.83 ICD-10])  RECOMMENDATIONS - Avoid alcohol, sedatives and other CNS depressants that may worsen sleep apnea and disrupt normal sleep architecture. - Sleep hygiene should be reviewed to assess factors that may improve sleep quality. - Weight management and regular exercise should be initiated or continued if appropriate.  Waymon Budge Diplomate, American Board of Sleep Medicine  ELECTRONICALLY SIGNED ON:  11/17/2015, 9:28 AM [facility] PH: [phone]   FX: [phone] ACCREDITED BY THE AMERICAN ACADEMY OF SLEEP MEDICINE

## 2015-11-22 ENCOUNTER — Encounter: Payer: Self-pay | Admitting: Family Medicine

## 2015-11-27 ENCOUNTER — Encounter: Payer: BLUE CROSS/BLUE SHIELD | Attending: Surgery | Admitting: Dietician

## 2015-11-27 ENCOUNTER — Encounter: Payer: Self-pay | Admitting: Dietician

## 2015-11-27 DIAGNOSIS — Z713 Dietary counseling and surveillance: Secondary | ICD-10-CM | POA: Diagnosis not present

## 2015-11-27 DIAGNOSIS — Z6841 Body Mass Index (BMI) 40.0 and over, adult: Secondary | ICD-10-CM | POA: Diagnosis not present

## 2015-11-27 NOTE — Patient Instructions (Signed)
-  Try some sugar free flavorings for water -Try Powerade Zero -Keep high protein snacks on hand: jerky, chicken/egg/tuna salad, boiled eggs, Greek yogurt   -Continue exercise routine  -Continue replace breakfast with protein shake   -See how many steps you get in a day on average, add 2000 and make that your goal  -Start keeping a food log -Try eating protein foods first, then veggies, then starch 

## 2015-11-27 NOTE — Progress Notes (Signed)
Supervised Weight loss:  Appt start time: 1120 end time:  1135  SWL visit 6:  Primary concerns today: Ariel [name] returns today for her 6th SWL visit in preparation for sleeve gastrectomy having lost 3.5 lbs. She reports that she feels ready to have surgery. Her cousin just had sleeve gastrectomy and Ariel [name] has been talking to her to get prepared. Her family is supportive of her. [name] noticed recently that she has been engaging in some emotional eating and has since been trying to be more mindful. She has really been practicing chewing thoroughly and avoiding liquids with meals.   Weight: 257.4 lbs BMI: 42.9  MEDICATIONS: see list  DIETARY INTAKE:  24-hr recall:  B ( AM): protein shake  Snk ( AM) :  L ( PM): fruit and hot dog  Snk ( PM): fruit or granola bar, sometimes chips D ( PM): liver, onions, and broccoli  Snk ( PM): fruit or chips  Recent physical activity: walking 4 days for 30 minutes  Estimated energy needs: 1600-1800 calories  Progress Towards Goal(s):  In progress.   Nutritional Diagnosis:  [place]-3.3 Overweight/obesity related to past poor dietary habits and physical inactivity as evidenced by patient in SWL for pending bariatric surgery following dietary guidelines for continued weight loss.     Intervention:  Nutrition counseling provided. -Keep high protein snacks on hand: jerky, chicken/egg/tuna salad, boiled eggs, Greek yogurt  -Continue exercise routine  -Continue replace breakfast with protein shake  -Keep your food log -Try eating protein foods first, then veggies, then starch  Monitoring/Evaluation:  Dietary intake, exercise, and body weight in 4 week(s).

## 2016-01-10 ENCOUNTER — Ambulatory Visit: Payer: BLUE CROSS/BLUE SHIELD | Admitting: Family Medicine

## 2016-01-11 ENCOUNTER — Emergency Department (HOSPITAL_COMMUNITY)
Admission: EM | Admit: 2016-01-11 | Discharge: 2016-01-11 | Disposition: A | Discharge status: Home / Self Care | Payer: BLUE CROSS/BLUE SHIELD | Attending: Emergency Medicine | Admitting: Emergency Medicine

## 2016-01-11 ENCOUNTER — Encounter (HOSPITAL_COMMUNITY): Payer: Self-pay | Admitting: *Deleted

## 2016-01-11 DIAGNOSIS — E119 Type 2 diabetes mellitus without complications: Secondary | ICD-10-CM | POA: Diagnosis not present

## 2016-01-11 DIAGNOSIS — E669 Obesity, unspecified: Secondary | ICD-10-CM | POA: Insufficient documentation

## 2016-01-11 DIAGNOSIS — I10 Essential (primary) hypertension: Secondary | ICD-10-CM | POA: Insufficient documentation

## 2016-01-11 DIAGNOSIS — Z79899 Other long term (current) drug therapy: Secondary | ICD-10-CM | POA: Insufficient documentation

## 2016-01-11 DIAGNOSIS — Z7984 Long term (current) use of oral hypoglycemic drugs: Secondary | ICD-10-CM | POA: Diagnosis not present

## 2016-01-11 DIAGNOSIS — F410 Panic disorder [episodic paroxysmal anxiety] without agoraphobia: Secondary | ICD-10-CM | POA: Diagnosis not present

## 2016-01-11 DIAGNOSIS — F419 Anxiety disorder, unspecified: Secondary | ICD-10-CM | POA: Diagnosis present

## 2016-01-11 HISTORY — DX: Panic disorder (episodic paroxysmal anxiety): F41.0

## 2016-01-11 LAB — CBC WITH DIFFERENTIAL/PLATELET
Basophils Absolute: 0 10*3/uL (ref 0.0–0.1)
Basophils Relative: 0 %
Eosinophils Absolute: 0.1 10*3/uL (ref 0.0–0.7)
Eosinophils Relative: 1 %
HEMATOCRIT: 36.7 % (ref 36.0–46.0)
HEMOGLOBIN: 12.3 g/dL (ref 12.0–15.0)
LYMPHS ABS: 2.1 10*3/uL (ref 0.7–4.0)
Lymphocytes Relative: 33 %
MCH: 26.3 pg (ref 26.0–34.0)
MCHC: 33.5 g/dL (ref 30.0–36.0)
MCV: 78.6 fL (ref 78.0–100.0)
MONOS PCT: 6 %
Monocytes Absolute: 0.4 10*3/uL (ref 0.1–1.0)
NEUTROS ABS: 3.8 10*3/uL (ref 1.7–7.7)
NEUTROS PCT: 60 %
Platelets: 296 10*3/uL (ref 150–400)
RBC: 4.67 MIL/uL (ref 3.87–5.11)
RDW: 13.5 % (ref 11.5–15.5)
WBC: 6.4 10*3/uL (ref 4.0–10.5)

## 2016-01-11 LAB — URINALYSIS, ROUTINE W REFLEX MICROSCOPIC
Bilirubin Urine: NEGATIVE
GLUCOSE, UA: NEGATIVE mg/dL
Hgb urine dipstick: NEGATIVE
Ketones, ur: NEGATIVE mg/dL
NITRITE: NEGATIVE
Protein, ur: 30 mg/dL — AB
Specific Gravity, Urine: 1.005 (ref 1.005–1.030)
pH: 7.5 (ref 5.0–8.0)

## 2016-01-11 LAB — ETHANOL: Alcohol, Ethyl (B): <5 mg/dL (ref <5)

## 2016-01-11 LAB — COMPREHENSIVE METABOLIC PANEL
ALBUMIN: 4.3 g/dL (ref 3.5–5.0)
ALK PHOS: 72 U/L (ref 38–126)
ALT: 39 U/L (ref 14–54)
ANION GAP: 8 (ref 5–15)
AST: 36 U/L (ref 15–41)
BILIRUBIN TOTAL: 0.8 mg/dL (ref 0.3–1.2)
BUN: 10 mg/dL (ref 6–20)
CALCIUM: 9 mg/dL (ref 8.9–10.3)
CO2: 27 mmol/L (ref 22–32)
CREATININE: 0.74 mg/dL (ref 0.44–1.00)
Chloride: 102 mmol/L (ref 101–111)
GFR calc non Af Amer: >60 mL/min (ref >60)
GLUCOSE: 117 mg/dL — AB (ref 65–99)
Potassium: 3.3 mmol/L — ABNORMAL LOW (ref 3.5–5.1)
Sodium: 137 mmol/L (ref 135–145)
TOTAL PROTEIN: 8.3 g/dL — AB (ref 6.5–8.1)

## 2016-01-11 LAB — URINE MICROSCOPIC-ADD ON

## 2016-01-11 LAB — CBG MONITORING, ED: Glucose-Capillary: 126 mg/dL — ABNORMAL HIGH (ref 65–99)

## 2016-01-11 LAB — PREGNANCY, URINE: Preg Test, Ur: NEGATIVE

## 2016-01-11 MED ORDER — POTASSIUM CHLORIDE CRYS ER 20 MEQ PO TBCR
40.0000 meq | EXTENDED_RELEASE_TABLET | Freq: Once | ORAL | Status: AC
  Administered 2016-01-11: 40 meq via ORAL
  Filled 2016-01-11: qty 2

## 2016-01-11 NOTE — ED Provider Notes (Signed)
CSN: [id number]     Arrival date & time 01/11/16  1429 History   First MD Initiated Contact with Patient 01/11/16 1555     Chief Complaint  Patient presents with  . Anxiety      HPI Pt was seen at 1600. Per pt, c/o gradual onset and persistence of multiple intermittent episodes of "anxiety" and "panic attacks" that wake her up from sleep for the past several months. Last episode was 2 days ago. Pt states she "took a nerve pill" with improvement in her symptoms. Denies symptoms at this time. Denies abd pain, no CP/SOB, no abd pain, no fevers, no focal motor weakness, no SI/SA/HI/hallucinations.    Past Medical History  Diagnosis Date  . Hypertension   . BV (bacterial vaginosis) 08/09/2013  . Vaginal itching 08/09/2013  . Obesity   . Diabetes mellitus without complication (HCC)     borderline  . Boil of buttock 09/12/2015  . Panic attack 07/2015    wakes up from sleep with panic attack   Past Surgical History  Procedure Laterality Date  . Tubal ligation     Family History  Problem Relation Age of Onset  . Hypertension Mother   . Diabetes Father   . Hypertension Father    Social History  Substance Use Topics  . Smoking status: Never Smoker   . Smokeless tobacco: Never Used  . Alcohol Use: No   OB History    Gravida Para Term Preterm AB TAB SAB Ectopic Multiple Living   '2 2        2     ' Review of Systems ROS: Statement: All systems negative except as marked or noted in the HPI; Constitutional: Negative for fever and chills. ; ; Eyes: Negative for eye pain, redness and discharge. ; ; ENMT: Negative for ear pain, hoarseness, nasal congestion, sinus pressure and sore throat. ; ; Cardiovascular: Negative for chest pain, palpitations, diaphoresis, dyspnea and peripheral edema. ; ; Respiratory: Negative for cough, wheezing and stridor. ; ; Gastrointestinal: Negative for nausea, vomiting, diarrhea, abdominal pain, blood in stool, hematemesis, jaundice and rectal bleeding. . ; ;  Genitourinary: Negative for dysuria, flank pain and hematuria. ; ; Musculoskeletal: Negative for back pain and neck pain. Negative for swelling and trauma.; ; Skin: Negative for pruritus, rash, abrasions, blisters, bruising and skin lesion.; ; Neuro: Negative for headache, lightheadedness and neck stiffness. Negative for weakness, altered level of consciousness , altered mental status, extremity weakness, paresthesias, involuntary movement, seizure and syncope.;; Psych:  +anxiety, panic attack. No SI, no SA, no HI, no hallucinations.    Allergies  Review of patient's allergies indicates no known allergies.  Home Medications   Prior to Admission medications   Medication Sig Start Date End Date Taking? Authorizing Provider  diclofenac (VOLTAREN) 75 MG EC tablet Take 1 tablet (75 mg total) by mouth 2 (two) times daily. Patient taking differently: Take 75 mg by mouth daily as needed for mild pain.  04/17/15  Yes Alycia Rossetti, MD  esomeprazole (NEXIUM) 20 MG packet Take 20 mg by mouth daily as needed.    Yes Historical Provider, MD  glipiZIDE (GLUCOTROL) 5 MG tablet Take 1 tablet (5 mg total) by mouth daily before breakfast. 09/13/15  Yes Alycia Rossetti, MD  hydrochlorothiazide (HYDRODIURIL) 25 MG tablet Take 1 tablet (25 mg total) by mouth daily. 04/17/15  Yes Alycia Rossetti, MD  Blood Glucose Monitoring Suppl (BLOOD GLUCOSE MONITOR KIT) KIT Monitor FSBS once daily in the AM. Dx:  250.00 01/18/14   Alycia Rossetti, MD  ONE TOUCH ULTRA TEST test strip  01/18/14   Historical Provider, MD  Jonetta Speak LANCETS 10G [place]  01/18/14   Historical Provider, MD   BP 164/82 mmHg  Pulse 98  Temp(Src) 97.9 F (36.6 C) (Oral)  Resp 16  Ht '5\' 5"'  (1.651 m)  Wt 240 lb (108.863 kg)  BMI 39.94 kg/m2  SpO2 100%  LMP  (Within Months) Physical Exam  1605: Physical examination:  Nursing notes reviewed; Vital signs and O2 SAT reviewed;  Constitutional: Well developed, Well nourished, Well hydrated, In no acute  distress; Head:  Normocephalic, atraumatic; Eyes: EOMI, PERRL, No scleral icterus; ENMT: Mouth and pharynx normal, Mucous membranes moist; Neck: Supple, Full range of motion, No lymphadenopathy; Cardiovascular: Regular rate and rhythm, No murmur, rub, or gallop; Respiratory: Breath sounds clear & equal bilaterally, No rales, rhonchi, wheezes.  Speaking full sentences with ease, Normal respiratory effort/excursion; Chest: Nontender, Movement normal; Abdomen: Soft, Nontender, Nondistended, Normal bowel sounds; Genitourinary: No CVA tenderness; Extremities: Pulses normal, No tenderness, No edema, No calf edema or asymmetry.; Neuro: AA&Ox3, Major CN grossly intact.  Speech clear. No gross focal motor or sensory deficits in extremities. Climbs on and off stretcher easily by herself. Gait steady.; Skin: Color normal, Warm, Dry.; Psych:  Calm, cooperative.    ED Course  Procedures (including critical care time) Labs Review  Imaging Review  I have personally reviewed and evaluated these images and lab results as part of my medical decision-making.   EKG Interpretation None      MDM  MDM Reviewed: previous chart, nursing note and vitals Reviewed previous: labs Interpretation: labs     Results for orders placed or performed during the hospital encounter of 01/11/16  Urinalysis, Routine w reflex microscopic  Result Value Ref Range   Color, Urine YELLOW YELLOW   APPearance HAZY (A) CLEAR   Specific Gravity, Urine 1.005 1.005 - 1.030   pH 7.5 5.0 - 8.0   Glucose, UA NEGATIVE NEGATIVE mg/dL   Hgb urine dipstick NEGATIVE NEGATIVE   Bilirubin Urine NEGATIVE NEGATIVE   Ketones, ur NEGATIVE NEGATIVE mg/dL   Protein, ur 30 (A) NEGATIVE mg/dL   Nitrite NEGATIVE NEGATIVE   Leukocytes, UA SMALL (A) NEGATIVE  Pregnancy, urine  Result Value Ref Range   Preg Test, Ur NEGATIVE NEGATIVE  CBC with Differential  Result Value Ref Range   WBC 6.4 4.0 - 10.5 K/uL   RBC 4.67 3.87 - 5.11 MIL/uL    Hemoglobin 12.3 12.0 - 15.0 g/dL   HCT 36.7 36.0 - 46.0 %   MCV 78.6 78.0 - 100.0 fL   MCH 26.3 26.0 - 34.0 pg   MCHC 33.5 30.0 - 36.0 g/dL   RDW 13.5 11.5 - 15.5 %   Platelets 296 150 - 400 K/uL   Neutrophils Relative % 60 %   Neutro Abs 3.8 1.7 - 7.7 K/uL   Lymphocytes Relative 33 %   Lymphs Abs 2.1 0.7 - 4.0 K/uL   Monocytes Relative 6 %   Monocytes Absolute 0.4 0.1 - 1.0 K/uL   Eosinophils Relative 1 %   Eosinophils Absolute 0.1 0.0 - 0.7 K/uL   Basophils Relative 0 %   Basophils Absolute 0.0 0.0 - 0.1 K/uL  Comprehensive metabolic panel  Result Value Ref Range   Sodium 137 135 - 145 mmol/L   Potassium 3.3 (L) 3.5 - 5.1 mmol/L   Chloride 102 101 - 111 mmol/L   CO2 27 22 -  32 mmol/L   Glucose, Bld 117 (H) 65 - 99 mg/dL   BUN 10 6 - 20 mg/dL   Creatinine, Ser 0.74 0.44 - 1.00 mg/dL   Calcium 9.0 8.9 - 10.3 mg/dL   Total Protein 8.3 (H) 6.5 - 8.1 g/dL   Albumin 4.3 3.5 - 5.0 g/dL   AST 36 15 - 41 U/L   ALT 39 14 - 54 U/L   Alkaline Phosphatase 72 38 - 126 U/L   Total Bilirubin 0.8 0.3 - 1.2 mg/dL   GFR calc non Af Amer >60 >60 mL/min   GFR calc Af Amer >60 >60 mL/min   Anion gap 8 5 - 15  Ethanol  Result Value Ref Range   Alcohol, Ethyl (B) <5 <5 mg/dL  Urine microscopic-add on  Result Value Ref Range   Squamous Epithelial / LPF 0-5 (A) NONE SEEN   WBC, UA 0-5 0 - 5 WBC/hpf   RBC / HPF 0-5 0 - 5 RBC/hpf   Bacteria, UA RARE (A) NONE SEEN  CBG monitoring, ED  Result Value Ref Range   Glucose-Capillary 126 (H) 65 - 99 mg/dL    1725:  Potassium repleted PO. Workup otherwise reassuring; f/u PMD. Dx and testing d/w pt.  Questions answered.  Verb understanding, agreeable to d/c home with outpt f/u.     Francine Graven, DO 01/15/16 1057

## 2016-01-11 NOTE — ED Notes (Signed)
Pt states she has a feeling of anxiety and being over whelmed. Pt states she had and episode on Thursday but took a nerve pill and she felt better. Pt is no longer having any symptoms at this time.

## 2016-01-11 NOTE — Discharge Instructions (Signed)
Substance Abuse Treatment Programs ° °Intensive Outpatient Programs °[facility]     °[address]      °[place], [place]                   °[phone]      ° °[facility] °[address] °[place], [place] °[phone] ° °[facility]     °(Inpatient and outpatient)     °[address]           °[phone]   ° °[facility] °[phone] (Suboxone and Methadone) ° °[address]      °[place], [place] [address]      °[phone]      ° °[address] °[place], [place] °852-3033 ° °[facility] (Outpatient/Inpatient, Chemical)    °(insurance only) [phone]      °       °[facility] (Groups & Residential) °[place], [place] °[phone] ° °   °[facility]     °[address]     °[place], [place]      °[phone]      ° °[facility] (for caregivers and family) °[address] °[place], [place] °[phone] ° ° ° ° ° °Residential Treatment Programs °[facility]      °[address], [place], [place] [address]  °[phone]      ° °[facility] °[address] [place], [place] [address] °[phone] ° °[facility]        °[phone]      ° °[facility] °[phone] ° °[facility]             °[address]                                         °[place], [place]                                                °[phone] or [phone]                              ° °[facility] [place] °[address] °[place], [address] °[phone] ° °[facility]    °[address]      °, [place]     °[phone]      ° °[facility] °[address] °, [place] °[phone] ° °[facility]   °[address]     °[place], [place] [address]     °[phone]      °Admissions: 8am-3pm M-F ° °[facility] °[address] °[place],  [place] °[phone] ° °[facility]: Residential Program (90 Days)   °[place], [place]      °[phone] or [phone]    ° °[facility] °[place], [place] °(Walk in Hours over the weekend or by referral) ° °[facility] °[address], [place], [place] [address] °[phone] ° °Crisis Mobile: [facility]:  1-877-626-1772 (for crisis response 24 hours a day) °[facility] Hotline:      1-800-256-2452 °Outpatient Psychiatry and Counseling ° °[facility]: Mobile Crisis   Management 24 hours:  [phone]  [facility] of the [employer] sliding scale fee and walk in schedule: M-F 8am-12pm/1pm-3pm [address]  [place], [place] [address] [place], [place] [address] [phone]  [facility] ([facility])- new patient walk-in appointments available Monday - Friday 8am -3pm.          [address] [place], [place] [address] [phone] or crisis line- [facility]/ Intensive Outpatient Therapy Program [place], [place] [address] [place]      [phone] [place] [place], [place] [address]                 [place]   [facility] [phone] [place], [place] [address]   [employer] [facility]          [address] Johnette Abraham  [place], [place] [address]       [phone]  [facility] [address] [place], [place] [address] [phone]  [facility]    [address] [place], [place] [address]     [place], [place] Joycelyn Man     [place] [address]     [phone]       [facility] [place] [address]  [facility]     [address], [place], [place], [place] [address] [place]     [address]     [place], [place] [address]     [phone]       [facility] [address] [place], [place] [address] [phone] Resources for Temporary Residential Assistance/Crisis [facility]) M-F 8am-3pm   [address] [place], [place] [address]   [phone] Services include: laundry, barbering, support groups, case management, phone  & computer access, showers, AA/NA mtgs, mental health/substance abuse nurse, job skills class, disability information, VA assistance, spiritual classes, etc.   [facility]   [address], [place]     [place]              [employer] (women and children)       [address]. [place], [place] [address] [phone] [email or web address] for application and process Application Required  [facility]   [address]    [place] [address]     [phone]                    [place],  [address] [phone](OILNZVJK application appt.) Application Required  [facility] (women only)    [address]     [place],  [address]     [phone]  Intake starts 6pm daily Need valid ID, SSC, & Police report [facility] [place] [address] [place], [place] [phone] Application Required  [facility] (men only)     [address]      [place], [place]     [phone]       [facility] (Pregnant women only) [address] [place], [place] [phone]  [facility]      [address]      [place], [place] [address]     [phone]             [facility] [address] [place], [place] [phone] 90 day commitment/SA/Application process  [facility](men only)     [address]     [place], [place]     [phone]       Check-in at [facility]7pm            [facility] [address] [place], [place] [address] [phone] Men/Women/Women and Children must be there by 7 pm  [facility] [place], [place] [phone]                  Take your usual prescriptions as previously directed.  Call your regular medical doctor on Monday to schedule a follow up appointment within the next 3 days.  Return to the Emergency Department immediately sooner if worsening.

## 2016-01-13 ENCOUNTER — Ambulatory Visit (INDEPENDENT_AMBULATORY_CARE_PROVIDER_SITE_OTHER): Payer: BLUE CROSS/BLUE SHIELD | Admitting: Family Medicine

## 2016-01-13 ENCOUNTER — Encounter: Payer: Self-pay | Admitting: Family Medicine

## 2016-01-13 VITALS — BP 144/88 | HR 92 | Temp 98.4°F | Resp 14 | Ht 65.0 in | Wt 258.0 lb

## 2016-01-13 DIAGNOSIS — R7303 Prediabetes: Secondary | ICD-10-CM | POA: Diagnosis not present

## 2016-01-13 DIAGNOSIS — K219 Gastro-esophageal reflux disease without esophagitis: Secondary | ICD-10-CM | POA: Diagnosis not present

## 2016-01-13 DIAGNOSIS — I10 Essential (primary) hypertension: Secondary | ICD-10-CM | POA: Diagnosis not present

## 2016-01-13 DIAGNOSIS — R000 Tachycardia, unspecified: Secondary | ICD-10-CM | POA: Diagnosis not present

## 2016-01-13 MED ORDER — NEBIVOLOL HCL 5 MG PO TABS: 5.0000 mg | ORAL_TABLET | Freq: Every day | ORAL | Status: DC

## 2016-01-13 MED ORDER — DEXLANSOPRAZOLE 60 MG PO CPDR: 60.0000 mg | DELAYED_RELEASE_CAPSULE | Freq: Every day | ORAL | Status: DC

## 2016-01-13 MED ORDER — HYDROCHLOROTHIAZIDE 25 MG PO TABS: 25.0000 mg | ORAL_TABLET | Freq: Every day | ORAL | Status: DC

## 2016-01-13 NOTE — Progress Notes (Signed)
Patient ID: Ariel [name], female   DOB: 01/03/1971, 45 y.o.   MRN: [id number]    Subjective:    Patient ID: Ariel ScrapeRita T [name], female    DOB: 12/26/1970, 45 y.o.   MRN: [id number]  Patient presents for ER F/U  Pt here with excessive bleching, chest discomfort, epigastric pain, nervousness for past few days. Seen in ER diagnosed with panic attack but she does not feel stressed or anxious. She did take benadryl which did not help, took Nexium for her stomach helped some. Also took a nerve pill from family friend which helped her sleep. No new meds otherwise.  She does not she has BM after eating as well which is unsual, No blood in stool    Review Of Systems:  GEN- denies fatigue, fever, weight loss,weakness, recent illness HEENT- denies eye drainage, change in vision, nasal discharge, CVS- denies chest pain, palpitations RESP- denies SOB, cough, wheeze ABD- denies N/V, change in stools,+ abd pain GU- denies dysuria, hematuria, dribbling, incontinence MSK- denies joint pain, muscle aches, injury Neuro- denies headache, dizziness, syncope, seizure activity       Objective:    BP 144/88 mmHg  Pulse 92  Temp(Src) 98.4 F (36.9 C) (Oral)  Resp 14  Ht 5\' 5"  (1.651 m)  Wt 258 lb (117.028 kg)  BMI 42.93 kg/m2  LMP  (Within Months) GEN- NAD, alert and oriented x3 HEENT- PERRL, EOMI, non injected sclera, pink conjunctiva, MMM, oropharynx clear Neck- Supple, no thyromegaly CVS-RRR, no murmur RESP-CTAB ABD-NABS,soft,TTP epigastric region, no rebound, no guarind, no CVA tenderness,  Chest wall- TTP sternal region Psych- normal affect and mood EXT- No edema Pulses- Radial 2+  EKG- Sinus tachycardia HR 103      Assessment & Plan:      Problem List Items Addressed This Visit    Prediabetes   Relevant Orders   Hemoglobin A1c   GERD (gastroesophageal reflux disease)    Reflux belching, GI system changes, trial of Dexilant Difficult to tease out symptoms as GI or cardiac  related Next step CT abd      Relevant Medications   dexlansoprazole (DEXILANT) 60 MG capsule   Essential hypertension, benign - Primary    BP elevated in setting of symptoms      Relevant Medications   hydrochlorothiazide (HYDRODIURIL) 25 MG tablet   nebivolol (BYSTOLIC) 5 MG tablet   Other Relevant Orders   EKG 12-Lead (Completed)    Other Visit Diagnoses    Tachycardia        Interesting symptoms, mild tachyardia and BP elevated, given bystolic 5mg , ccheck labs    Relevant Orders    TSH    Basic metabolic panel       Note: This dictation was prepared with Dragon dictation along with smaller phrase technology. Any transcriptional errors that result from this process are unintentional.

## 2016-01-13 NOTE — Assessment & Plan Note (Signed)
BP elevated in setting of symptoms

## 2016-01-13 NOTE — Patient Instructions (Addendum)
Take dexilant instead of Nexium Take the bystolic once a day for blood pressure F/U for recheck in 1 week

## 2016-01-13 NOTE — Assessment & Plan Note (Signed)
Reflux belching, GI system changes, trial of Dexilant Difficult to tease out symptoms as GI or cardiac related Next step CT abd

## 2016-01-14 LAB — HEMOGLOBIN A1C
HEMOGLOBIN A1C: 7.6 % — AB (ref <5.7)
Mean Plasma Glucose: 171 mg/dL

## 2016-01-14 LAB — BASIC METABOLIC PANEL
BUN: 10 mg/dL (ref 7–25)
CALCIUM: 9.6 mg/dL (ref 8.6–10.2)
CHLORIDE: 101 mmol/L (ref 98–110)
CO2: 28 mmol/L (ref 20–31)
CREATININE: 0.8 mg/dL (ref 0.50–1.10)
Glucose, Bld: 126 mg/dL — ABNORMAL HIGH (ref 70–99)
Potassium: 3.5 mmol/L (ref 3.5–5.3)
Sodium: 138 mmol/L (ref 135–146)

## 2016-01-14 LAB — TSH: TSH: 1.41 mIU/L

## 2016-01-15 ENCOUNTER — Other Ambulatory Visit: Payer: Self-pay | Admitting: *Deleted

## 2016-01-15 MED ORDER — GLIPIZIDE 5 MG PO TABS: 5.0000 mg | ORAL_TABLET | Freq: Two times a day (BID) | ORAL | Status: DC

## 2016-01-20 ENCOUNTER — Encounter: Payer: Self-pay | Admitting: Family Medicine

## 2016-01-20 ENCOUNTER — Ambulatory Visit (INDEPENDENT_AMBULATORY_CARE_PROVIDER_SITE_OTHER): Payer: BLUE CROSS/BLUE SHIELD | Admitting: Family Medicine

## 2016-01-20 VITALS — BP 140/78 | HR 78 | Temp 97.8°F | Resp 12 | Ht 65.0 in | Wt 258.0 lb

## 2016-01-20 DIAGNOSIS — E119 Type 2 diabetes mellitus without complications: Secondary | ICD-10-CM | POA: Diagnosis not present

## 2016-01-20 DIAGNOSIS — I10 Essential (primary) hypertension: Secondary | ICD-10-CM | POA: Diagnosis not present

## 2016-01-20 DIAGNOSIS — R0981 Nasal congestion: Secondary | ICD-10-CM

## 2016-01-20 DIAGNOSIS — K219 Gastro-esophageal reflux disease without esophagitis: Secondary | ICD-10-CM

## 2016-01-20 MED ORDER — DEXLANSOPRAZOLE 60 MG PO CPDR: 60.0000 mg | DELAYED_RELEASE_CAPSULE | Freq: Every day | ORAL | Status: DC

## 2016-01-20 MED ORDER — FLUTICASONE PROPIONATE 50 MCG/ACT NA SUSP: 2.0000 | Freq: Every day | NASAL | Status: DC

## 2016-01-20 MED ORDER — NEBIVOLOL HCL 5 MG PO TABS: 5.0000 mg | ORAL_TABLET | Freq: Every day | ORAL | Status: DC

## 2016-01-20 NOTE — Patient Instructions (Addendum)
Add Claritin or zyrtec , stop the Afrin, use flonase  Continue dexliant  Continue bystolic  Call the bariatric surgeon  Take the glipizde as prescribed F/u 6 weeks

## 2016-01-20 NOTE — Progress Notes (Signed)
Patient ID: Ariel [name] T Vana, female   DOB: 03/21/1971, 45 y.o.   MRN: [id number]   Subjective:    Patient ID: Ariel [name] T [place]me[place], female    DOB: 02/23/1971, 45 y.o.   MRN: [id number]  Patient presents for 1 week F/U and Illness Patient here for follow-up. She was seen last week with very complicated cardiac and gastroenterology symptoms. Her blood pressure is elevated and she had mild tachycardia. She had been in the emergency room their diagnosis with panic attack. I started her on by systolic in addition to her hydrochlorothiazide she states the palpitations are resolved she's not had any chest discomfort. I also changed her from Nexium to Dexilant for her acid reflux and all of her GI symptoms have resolved. She states she was even able to work out later that week. She has had some nasal congestion mild drainage for the past few days. She started taking over-the-counter medications and decongestants she's been using Afrin she even took a dose of Tamiflu from a friend of hers. Lasix on her last visit which were normal the exception of her A1c which increases 7.6% she is currently on glipizide to not tolerate metformin   Review Of Systems:  GEN- denies fatigue, fever, weight loss,weakness, recent illness HEENT- denies eye drainage, change in vision,+ nasal discharge, CVS- denies chest pain, palpitations RESP- denies SOB, cough, wheeze ABD- denies N/V, change in stools, abd pain GU- denies dysuria, hematuria, dribbling, incontinence MSK- denies joint pain, muscle aches, injury Neuro- denies headache, dizziness, syncope, seizure activity       Objective:    BP 140/78 mmHg  Pulse 78  Temp(Src) 97.8 F (36.6 C) (Oral)  Resp 12  Ht 5\' 5"  (1.651 m)  Wt 258 lb (117.028 kg)  BMI 42.93 kg/m2  LMP  (Within Months) GEN- NAD, alert and oriented x3 HEENT- PERRL, EOMI, non injected sclera, pink conjunctiva, MMM, oropharynx clear, nares clear rhinorrhea, no enlarged turbinates, no maxillary sinus  tenderness  Neck- Supple, no thyromegaly CVS- RRR, no murmur RESP-CTAB EXT- trace pedal edema Pulses- Radial  2+        Assessment & Plan:      Problem List Items Addressed This Visit    GERD (gastroesophageal reflux disease)    We'll continue the Dexilant.       Relevant Medications   dexlansoprazole (DEXILANT) 60 MG capsule   Essential hypertension, benign    Blood pressures improved some today however I think she's been taking too many over-the-counter medications why we cannot see her accurate blood pressure. I'll continue her on the bi-systolic as her symptoms have all resolved with its addition      Relevant Medications   nebivolol (BYSTOLIC) 5 MG tablet   Diabetes mellitus (HCC)    Diabetes type 2 uncontrolled increase her glipizide to 5 mg twice a day she is still awaiting bariatric surgery which I think will help her blood pressure as well as her diabetes and other comorbidities. Follow-up in 6 weeks       Other Visit Diagnoses    Nasal congestion    -  Primary    I think ths is allergy related, advised to stop the decongestants and Afrin, all of the OTC meds are running BP up as well. Change to anti-hsimtaine and flonase       Note: This dictation was prepared with Dragon dictation along with smaller phrase technology. Any transcriptional errors that result from this process are unintentional.

## 2016-01-20 NOTE — Assessment & Plan Note (Signed)
Blood pressures improved some today however I think she's been taking too many over-the-counter medications why we cannot see her accurate blood pressure. I'll continue her on the bi-systolic as her symptoms have all resolved with its addition

## 2016-01-20 NOTE — Assessment & Plan Note (Signed)
We'll continue the Dexilant.

## 2016-01-20 NOTE — Assessment & Plan Note (Signed)
Diabetes type 2 uncontrolled increase her glipizide to 5 mg twice a day she is still awaiting bariatric surgery which I think will help her blood pressure as well as her diabetes and other comorbidities. Follow-up in 6 weeks

## 2016-03-02 ENCOUNTER — Encounter: Payer: BLUE CROSS/BLUE SHIELD | Attending: Surgery

## 2016-03-02 ENCOUNTER — Ambulatory Visit (INDEPENDENT_AMBULATORY_CARE_PROVIDER_SITE_OTHER): Payer: BLUE CROSS/BLUE SHIELD | Admitting: Family Medicine

## 2016-03-02 ENCOUNTER — Encounter: Payer: Self-pay | Admitting: Family Medicine

## 2016-03-02 VITALS — BP 128/78 | HR 82 | Temp 97.5°F | Resp 14 | Ht 65.0 in | Wt 258.0 lb

## 2016-03-02 DIAGNOSIS — E119 Type 2 diabetes mellitus without complications: Secondary | ICD-10-CM

## 2016-03-02 DIAGNOSIS — I10 Essential (primary) hypertension: Secondary | ICD-10-CM | POA: Diagnosis not present

## 2016-03-02 MED ORDER — GLIPIZIDE 5 MG PO TABS: 5.0000 mg | ORAL_TABLET | Freq: Two times a day (BID) | ORAL | Status: DC

## 2016-03-02 NOTE — Progress Notes (Signed)
  Pre-Operative Nutrition Class:  Appt start time: 3524   End time:  1830.  Patient was seen on 03/02/2016 for Pre-Operative Bariatric Surgery [facility]   Surgery date: 03/17/2016 Surgery type: Sleeve gastrectomy [facility]: 251 lbs on 06/05/2015 Weight today: 261.2 lbs  TANITA  BODY COMP RESULTS  03/02/16   BMI (kg/m^2) 43.5   Fat Mass (lbs) 132.2   Fat Free Mass (lbs) 129   Total Body Water (lbs) 95   Samples given per MNT protocol. Patient educated on appropriate usage: Premier protein shake (vanilla - qty 1) Lot #: [id number] Exp: 12/2016  Celebrate Vitamins Multivitamin (orange - qty 1) Lot #: [id number] Exp: 09/2016  Bariatric Advantage Calcium Citrate chew (tropical orange - qty 1) Lot #: [id number] Exp: 05/2016  Unjury Protein Powder (strawberry - qty 1) Lot #: [id number] Exp: 04/2017  The following the learning objectives were met by the patient during this course:  Identify Pre-Op Dietary Goals and will begin 2 weeks pre-operatively  Identify appropriate sources of fluids and proteins   State protein recommendations and appropriate sources pre and post-operatively  Identify Post-Operative Dietary Goals and will follow for 2 weeks post-operatively  Identify appropriate multivitamin and calcium sources  Describe the need for physical activity post-operatively and will follow MD recommendations  State when to call healthcare provider regarding medication questions or post-operative complications  Handouts given during class include:  Pre-Op Bariatric Surgery Diet Handout  Protein Shake Handout  Post-Op Bariatric Surgery Nutrition Handout  [facility] Program Information Flyer  Support Group Information Flyer  [facility] Bariatric Supplements Price List  Follow-Up Plan: Patient will follow-up at [facility] 2 weeks post operatively for diet advancement per MD.

## 2016-03-02 NOTE — Assessment & Plan Note (Signed)
Blood pressure is controlled no change in medication continue the bystolic and hydrochlorothiazide

## 2016-03-02 NOTE — Patient Instructions (Signed)
Get claritin or zyrtec  Continue the glipizide  F/U 2 months

## 2016-03-02 NOTE — Assessment & Plan Note (Signed)
Continue glipizide at current dose. Hopefully we will be able to get her off these medications when she loses a good amount of weight with her bariatric surgery

## 2016-03-02 NOTE — Progress Notes (Signed)
Patient ID: Ariel [name], female   DOB: 03/02/1971, 45 y.o.   MRN: [id number]   Subjective:    Patient ID: Ariel ScrapeRita T [name], female    DOB: 02/12/1971, 45 y.o.   MRN: [id number]  Patient presents for 6 week F/U and Nasal Congestion Patient here for interim follow-up. With regard to her nasal congestion she never picked up antihistamine and has been using Flonase every few days but it does work when she does use it.  Diabetes mellitus she is now on glipizide 5 mg twice a day she's not been checking her blood sugars regularly and she has some difficulties with her insurance the pharmacy she has been checking it once to twice a week her fastings are around 120. Note she has been taking her blood pressure medicine as prescribed she is also scheduled to have bariatric surgery on June 5    Review Of Systems:  GEN- denies fatigue, fever, weight loss,weakness, recent illness HEENT- denies eye drainage, change in vision, nasal discharge, CVS- denies chest pain, palpitations RESP- denies SOB, cough, wheeze ABD- denies N/V, change in stools, abd pain GU- denies dysuria, hematuria, dribbling, incontinence MSK- denies joint pain, muscle aches, injury Neuro- denies headache, dizziness, syncope, seizure activity       Objective:    BP 128/78 mmHg  Pulse 82  Temp(Src) 97.5 F (36.4 C) (Oral)  Resp 14  Ht 5\' 5"  (1.651 m)  Wt 258 lb (117.028 kg)  BMI 42.93 kg/m2 GEN- NAD, alert and oriented x3 HEENT- PERRL, EOMI, non injected sclera, pink conjunctiva, MMM, oropharynx clear, nares clear rhinorrhea, no maxillary sinus tenderness Neck- Supple, no LAD CVS- RRR, no murmur RESP-CTAB ABD-NABS,soft,NT,ND EXT- No edema Pulses- Radial, DP- 2+        Assessment & Plan:      Problem List Items Addressed This Visit    Morbid obesity (HCC)   Relevant Medications   glipiZIDE (GLUCOTROL) 5 MG tablet   Essential hypertension, benign    Blood pressure is controlled no change in medication  continue the bystolic and hydrochlorothiazide      Diabetes mellitus (HCC) - Primary    Continue glipizide at current dose. Hopefully we will be able to get her off these medications when she loses a good amount of weight with her bariatric surgery      Relevant Medications   glipiZIDE (GLUCOTROL) 5 MG tablet      Note: This dictation was prepared with Dragon dictation along with smaller phrase technology. Any transcriptional errors that result from this process are unintentional.

## 2016-03-03 ENCOUNTER — Other Ambulatory Visit: Payer: Self-pay | Admitting: Surgery

## 2016-03-05 ENCOUNTER — Encounter (HOSPITAL_COMMUNITY)
Admission: RE | Admit: 2016-03-05 | Discharge: 2016-03-05 | Disposition: A | Discharge status: Home / Self Care | Payer: BLUE CROSS/BLUE SHIELD | Source: Ambulatory Visit | Attending: Surgery | Admitting: Surgery

## 2016-03-05 ENCOUNTER — Encounter (HOSPITAL_COMMUNITY): Payer: Self-pay

## 2016-03-05 DIAGNOSIS — Z01812 Encounter for preprocedural laboratory examination: Secondary | ICD-10-CM | POA: Diagnosis not present

## 2016-03-05 DIAGNOSIS — Z6841 Body Mass Index (BMI) 40.0 and over, adult: Secondary | ICD-10-CM | POA: Insufficient documentation

## 2016-03-05 HISTORY — DX: Personal history of other medical treatment: Z92.89

## 2016-03-05 LAB — COMPREHENSIVE METABOLIC PANEL
ALT: 49 U/L (ref 14–54)
AST: 51 U/L — ABNORMAL HIGH (ref 15–41)
Albumin: 4.6 g/dL (ref 3.5–5.0)
Alkaline Phosphatase: 62 U/L (ref 38–126)
Anion gap: 7 (ref 5–15)
BUN: 13 mg/dL (ref 6–20)
CALCIUM: 9.5 mg/dL (ref 8.9–10.3)
CHLORIDE: 102 mmol/L (ref 101–111)
CO2: 28 mmol/L (ref 22–32)
CREATININE: 0.81 mg/dL (ref 0.44–1.00)
Glucose, Bld: 169 mg/dL — ABNORMAL HIGH (ref 65–99)
Potassium: 3.9 mmol/L (ref 3.5–5.1)
Sodium: 137 mmol/L (ref 135–145)
Total Bilirubin: 0.8 mg/dL (ref 0.3–1.2)
Total Protein: 8.4 g/dL — ABNORMAL HIGH (ref 6.5–8.1)

## 2016-03-05 LAB — CBC WITH DIFFERENTIAL/PLATELET
Basophils Absolute: 0 10*3/uL (ref 0.0–0.1)
Basophils Relative: 0 %
EOS PCT: 2 %
Eosinophils Absolute: 0.1 10*3/uL (ref 0.0–0.7)
HCT: 38.6 % (ref 36.0–46.0)
Hemoglobin: 12.8 g/dL (ref 12.0–15.0)
LYMPHS ABS: 2.2 10*3/uL (ref 0.7–4.0)
LYMPHS PCT: 45 %
MCH: 26.1 pg (ref 26.0–34.0)
MCHC: 33.2 g/dL (ref 30.0–36.0)
MCV: 78.6 fL (ref 78.0–100.0)
MONO ABS: 0.4 10*3/uL (ref 0.1–1.0)
MONOS PCT: 8 %
Neutro Abs: 2.3 10*3/uL (ref 1.7–7.7)
Neutrophils Relative %: 45 %
PLATELETS: 342 10*3/uL (ref 150–400)
RBC: 4.91 MIL/uL (ref 3.87–5.11)
RDW: 13.8 % (ref 11.5–15.5)
WBC: 5 10*3/uL (ref 4.0–10.5)

## 2016-03-05 NOTE — Pre-Procedure Instructions (Signed)
EKG 01-13-16 Epic/ CT abd/pelvis 1'17 Epic.

## 2016-03-05 NOTE — Patient Instructions (Addendum)
Ariel [name]  03/05/2016   Your procedure is scheduled on: 03-17-16   [facility]  Entrance [facility]  elevators to 3rd floor to  Short Stay Center at 0800 AM.  Call this number if you have problems the morning of surgery [phone]   Remember: ONLY 1 PERSON MAY GO WITH YOU TO SHORT STAY TO GET  READY MORNING OF YOUR SURGERY.  Do not eat food or drink liquids :After Midnight.    Follow any bowel prep instructions per office.  Take these medicines the morning of surgery with A SIP OF WATER: Bystolic. DO NOT TAKE ANY DIABETIC MEDICATIONS DAY OF YOUR SURGERY                               You may not have any metal on your body including hair pins and              piercings  Do not wear jewelry, make-up, lotions, powders or perfumes, deodorant             Do not wear nail polish.  Do not shave  48 hours prior to surgery.              Men may shave face and neck.   Do not bring valuables to the hospital. [facility] IS NOT             RESPONSIBLE   FOR VALUABLES.  Contacts, dentures or bridgework may not be worn into surgery.  Leave suitcase in the car. After surgery it may be brought to your room.     Patients discharged the day of surgery will not be allowed to drive home.  Name and phone number of your driver: Ariel [name]-daughter [phone] cell  Special Instructions: N/A              Please read over the following fact sheets you were given: _____________________________________________________________________             [facility] Pre[facility] Before surgery, you can play an important role.  Because skin is not sterile, your skin needs to be as free of germs as possible.  You can reduce the number of germs on your skin by washing with CHG (chlorahexidine gluconate) soap before surgery.  CHG is an antiseptic cleaner which kills germs and bonds with the skin to continue killing germs even after washing. Please DO NOT  use if you have an allergy to CHG or antibacterial soaps.  If your skin becomes reddened/irritated stop using the CHG and inform your nurse when you arrive at Short Stay. Do not shave (including legs and underarms) for at least 48 hours prior to the first CHG shower.  You may shave your face/neck. Please follow these instructions carefully:  1.  Shower with CHG Soap the night before surgery and the  morning of Surgery.  2.  If you choose to wash your hair, wash your hair first as usual with your  normal  shampoo.  3.  After you shampoo, rinse your hair and body thoroughly to remove the  shampoo.                           4.  Use CHG as you would any other liquid soap.  You  can apply chg directly  to the skin and wash                       Gently with a scrungie or clean washcloth.  5.  Apply the CHG Soap to your body ONLY FROM THE NECK DOWN.   Do not use on face/ open                           Wound or open sores. Avoid contact with eyes, ears mouth and genitals (private parts).                       Wash face,  Genitals (private parts) with your normal soap.             6.  Wash thoroughly, paying special attention to the area where your surgery  will be performed.  7.  Thoroughly rinse your body with warm water from the neck down.  8.  DO NOT shower/wash with your normal soap after using and rinsing off  the CHG Soap.                9.  Pat yourself dry with a clean towel.            10.  Wear clean pajamas.            11.  Place clean sheets on your bed the night of your first shower and do not  sleep with pets. Day of Surgery : Do not apply any lotions/deodorants the morning of surgery.  Please wear clean clothes to the hospital/surgery center.  FAILURE TO FOLLOW THESE INSTRUCTIONS MAY RESULT IN THE CANCELLATION OF YOUR SURGERY PATIENT SIGNATURE_________________________________  NURSE  SIGNATURE__________________________________  ________________________________________________________________________  Adam Phenix  An incentive spirometer is a tool that can help keep your lungs clear and active. This tool measures how well you are filling your lungs with each breath. Taking long deep breaths may help reverse or decrease the chance of developing breathing (pulmonary) problems (especially infection) following:  A long period of time when you are unable to move or be active. BEFORE THE PROCEDURE   If the spirometer includes an indicator to show your best effort, your nurse or respiratory therapist will set it to a desired goal.  If possible, sit up straight or lean slightly forward. Try not to slouch.  Hold the incentive spirometer in an upright position. INSTRUCTIONS FOR USE  1. Sit on the edge of your bed if possible, or sit up as far as you can in bed or on a chair. 2. Hold the incentive spirometer in an upright position. 3. Breathe out normally. 4. Place the mouthpiece in your mouth and seal your lips tightly around it. 5. Breathe in slowly and as deeply as possible, raising the piston or the ball toward the top of the column. 6. Hold your breath for 3-5 seconds or for as long as possible. Allow the piston or ball to fall to the bottom of the column. 7. Remove the mouthpiece from your mouth and breathe out normally. 8. Rest for a few seconds and repeat Steps 1 through 7 at least 10 times every 1-2 hours when you are awake. Take your time and take a few normal breaths between deep breaths. 9. The spirometer may include an indicator to show your best effort. Use the indicator as a goal to work toward during each  repetition. 10. After each set of 10 deep breaths, practice coughing to be sure your lungs are clear. If you have an incision (the cut made at the time of surgery), support your incision when coughing by placing a pillow or rolled up towels firmly against  it. Once you are able to get out of bed, walk around indoors and cough well. You may stop using the incentive spirometer when instructed by your caregiver.  RISKS AND COMPLICATIONS  Take your time so you do not get dizzy or light-headed.  If you are in pain, you may need to take or ask for pain medication before doing incentive spirometry. It is harder to take a deep breath if you are having pain. AFTER USE  Rest and breathe slowly and easily.  It can be helpful to keep track of a log of your progress. Your caregiver can provide you with a simple table to help with this. If you are using the spirometer at home, follow these instructions: [place] IF:   You are having difficultly using the spirometer.  You have trouble using the spirometer as often as instructed.  Your pain medication is not giving enough relief while using the spirometer.  You develop fever of 100.5 F (38.1 C) or higher. SEEK IMMEDIATE MEDICAL CARE IF:   You cough up bloody sputum that had not been present before.  You develop fever of 102 F (38.9 C) or greater.  You develop worsening pain at or near the incision site. MAKE SURE YOU:   Understand these instructions.  Will watch your condition.  Will get help right away if you are not doing well or get worse. Document Released: 02/08/2007 Document Revised: 12/21/2011 Document Reviewed: 04/11/2007 [facility] Patient Information 2014 [place], [place].   ________________________________________________________________________

## 2016-03-06 LAB — HEMOGLOBIN A1C
Hgb A1c MFr Bld: 7.6 % — ABNORMAL HIGH (ref 4.8–5.6)
Mean Plasma Glucose: 171 mg/dL

## 2016-03-16 NOTE — H&P (Signed)
Ariel [name]  Location: [facility] Patient #: [id number] DOB: 06-14-1971 Single / Language: [place] / Race: Black or African American Female  History of Present Illness   The patient is a 45 year old female who presents for a bariatric surgery evaluation.   Her PCP is Dr. Lucille [name] [facility])  She has her daughter, Ariel [name], with her.   She is for sleeve gastrectomy on 03/17/2016. She understands the operation well. I went over the [name] course and the postoperative recovery. I talked about her being out of work for about 1 month after surgery. She is transitioning to a new job, but has not started yet.  She underwent a sleep study. Report for Dr. Melba Coon 11/2015 - she has "primary snoring".  UGI - 06/13/2015 - negative  [place] of abdomen - 06/13/2015 - Hepatic steatosis  She saw Dr. Cyndia Skeeters for psych - 07/11/2015.  History of obesity: The patient has been overweight much of her adult life. She has been to one of our information sessions, though she is unsure who spoke. At this time she is unsure between a sleeve or gastric bypass for surgery. She had a friend he's had a gastric bypass and has done well. I think she has a cousin who is about to undergo weight loss surgery. She has tried multiple diets including: Vegetable diet, a diet similar Weight Watchers, and referred diet. She's tried different medications include Fen/Phen and phentermine. She has some success with a phentermine.  Per the 1991 NIH Consensus Statement, the patient is a candidate for bariatric surgery. The patient attended an information session and reviewed the types of bariatric surgery.  The patient is interested in the sleeve resection. I discussed with the patient the indications and risks of bariatric surgery. The potential risks of surgery include, but are not limited to, bleeding, infection, leak from the bowel, DVT and PE, open surgery,  long term nutrition consequences, and death.  The patient understands the importance of compliance and long term follow-up with our group after surgery.  Past Medical History: 1. HTN 2. Borderline diabetes 3. She has been told that she has a heart murmur 4. She has an Epworth score - 21 - her daughter says that she snores Sleep test per Dr. Maple Hudson was okay. 5. She saw Dr. Cyndia Skeeters for psych - 07/11/2015.  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 She'll have insurance that will last for 6 months. So she is hoping to get approved before he insurance runs out. She is not working for [employer]. She is hoping to get a job with [employer] Roosevelt and [employer] Lincoln after surgery. She has 2 children: 69 yo son and 74 yo daughter. Her daughter is Insurance claims handler.   Problem List/Past Medical Kandis Cocking, MD; 03/11/2016 12:37 PM) MORBID OBESITY (E66.01) GERD (GASTROESOPHAGEAL REFLUX DISEASE) (K21.9)  Other Problems Kandis Cocking, MD; 03/11/2016 12:37 PM) Back Pain High blood pressure  Past Surgical History Kandis Cocking, MD; 03/11/2016 12:37 PM) Colon Polyp Removal - Colonoscopy  Allergies Fay Records, CMA; 03/11/2016 11:32 AM) No Known Drug Allergies08/01/2015  Medication History Fay Records, CMA; 03/11/2016 11:32 AM) Diclofenac Sodium (  Tablet DR, Oral) Active. Hydrochlorothiazide (  Tablet, Oral) Active. MetFORMIN HCl (  Tablet, Oral) Active. Blood Glucose Monitoring Suppl Active. Medications Reconciled   Review of Systems Kandis Cocking, MD; 03/11/2016 12:57 PM) General Present- Weight Gain. Not Present- Appetite  Loss, Chills, Fatigue, Fever, Night Sweats and Weight Loss. Skin Not Present- Change in Wart/Mole, Dryness, Hives, Jaundice, New Lesions, Non-Healing Wounds, Rash and Ulcer. Respiratory Present- Wheezing. Not Present- Bloody sputum, Chronic Cough,  Difficulty Breathing and Snoring. Gastrointestinal Present- Excessive gas and Indigestion. Not Present- Abdominal Pain, Bloating, Bloody Stool, Change in Bowel Habits, Chronic diarrhea, Constipation, Difficulty Swallowing, Gets full quickly at meals, Hemorrhoids, Nausea, Rectal Pain and Vomiting. Neurological Present- Tingling. Not Present- Decreased Memory, Fainting, Headaches, Numbness, Seizures, Tremor, Trouble walking and Weakness. Endocrine Present- New Diabetes. Not Present- Cold Intolerance, Excessive Hunger, Hair Changes, Heat Intolerance and Hot flashes.  Vitals Fay Records(Ashley Beck CMA; 03/11/2016 11:33 AM) 03/11/2016 11:32 AM Weight: 259 lb Height: 65.5in Body Surface Area: 2.22 m Body Mass Index: 42.44 kg/m  Temp.: 98.66F(Temporal)  Pulse: 98 (Regular)  BP: 130/84 (Sitting, Left Arm, Standard)   Physical Exam  General: WN obese AA F alert and generally healthy appearing. HEENT: Normal. Pupils equal.  Neck: Supple. No mass. No thyroid mass.  Lymph Nodes: No supraclavicular or cervical nodes.  Lungs: Clear to auscultation and symmetric breath sounds. Heart: RRR. No murmur or rub. I did not hear a specific murmur.  Abdomen: Soft. No mass. No tenderness. No hernia. Normal bowel sounds. No abdominal scars.   She is a little more apple than pear. She has a small pannus.  Extremities: Good strength and ROM in upper and lower extremities.  Neurologic: Grossly intact to motor and sensory function. Psychiatric: Has normal mood and affect. Behavior is normal.  Assessment & Plan  1.  OBESITY, MORBID, BMI 40.0-49.9  Impression: Plan:   1) For sleeve gastrectomy - 03/17/2016.   2) Prescriptions for oxycodone, protonix, and zofran given  2. HTN 3. Borderline diabetes  Ovidio Kinavid Abbigail Anstey, MD, [facility] Pager: [phone] Office phone:  [phone]

## 2016-03-17 ENCOUNTER — Encounter (HOSPITAL_COMMUNITY): Payer: Self-pay | Admitting: *Deleted

## 2016-03-17 ENCOUNTER — Encounter (HOSPITAL_COMMUNITY)
Admission: RE | Disposition: A | Discharge status: Home / Self Care | Payer: Self-pay | Source: Ambulatory Visit | Attending: Surgery

## 2016-03-17 ENCOUNTER — Inpatient Hospital Stay (HOSPITAL_COMMUNITY): Payer: BLUE CROSS/BLUE SHIELD | Admitting: Anesthesiology

## 2016-03-17 ENCOUNTER — Inpatient Hospital Stay (HOSPITAL_COMMUNITY)
Admission: RE | Admit: 2016-03-17 | Discharge: 2016-03-18 | DRG: 621 | Disposition: A | Discharge status: Home / Self Care | Payer: BLUE CROSS/BLUE SHIELD | Source: Ambulatory Visit | Attending: Surgery | Admitting: Surgery

## 2016-03-17 DIAGNOSIS — I10 Essential (primary) hypertension: Secondary | ICD-10-CM | POA: Diagnosis present

## 2016-03-17 DIAGNOSIS — Z6841 Body Mass Index (BMI) 40.0 and over, adult: Secondary | ICD-10-CM

## 2016-03-17 DIAGNOSIS — Z791 Long term (current) use of non-steroidal anti-inflammatories (NSAID): Secondary | ICD-10-CM

## 2016-03-17 DIAGNOSIS — Z86010 Personal history of colonic polyps: Secondary | ICD-10-CM | POA: Diagnosis not present

## 2016-03-17 DIAGNOSIS — R011 Cardiac murmur, unspecified: Secondary | ICD-10-CM | POA: Diagnosis present

## 2016-03-17 DIAGNOSIS — Z79899 Other long term (current) drug therapy: Secondary | ICD-10-CM | POA: Diagnosis not present

## 2016-03-17 DIAGNOSIS — Z7984 Long term (current) use of oral hypoglycemic drugs: Secondary | ICD-10-CM | POA: Diagnosis not present

## 2016-03-17 DIAGNOSIS — R0683 Snoring: Secondary | ICD-10-CM | POA: Diagnosis present

## 2016-03-17 DIAGNOSIS — E119 Type 2 diabetes mellitus without complications: Secondary | ICD-10-CM | POA: Diagnosis present

## 2016-03-17 DIAGNOSIS — K219 Gastro-esophageal reflux disease without esophagitis: Secondary | ICD-10-CM | POA: Diagnosis present

## 2016-03-17 HISTORY — PX: LAPAROSCOPIC GASTRIC SLEEVE RESECTION: SHX5895

## 2016-03-17 HISTORY — PX: UPPER GI ENDOSCOPY: SHX6162

## 2016-03-17 LAB — PREGNANCY, URINE: PREG TEST UR: NEGATIVE

## 2016-03-17 LAB — GLUCOSE, CAPILLARY
GLUCOSE-CAPILLARY: 144 mg/dL — AB (ref 65–99)
GLUCOSE-CAPILLARY: 205 mg/dL — AB (ref 65–99)
GLUCOSE-CAPILLARY: 208 mg/dL — AB (ref 65–99)
GLUCOSE-CAPILLARY: 215 mg/dL — AB (ref 65–99)
Glucose-Capillary: 195 mg/dL — ABNORMAL HIGH (ref 65–99)

## 2016-03-17 LAB — HEMOGLOBIN AND HEMATOCRIT, BLOOD
HEMATOCRIT: 34.9 % — AB (ref 36.0–46.0)
HEMOGLOBIN: 11.4 g/dL — AB (ref 12.0–15.0)

## 2016-03-17 SURGERY — GASTRECTOMY, SLEEVE, LAPAROSCOPIC
Anesthesia: General

## 2016-03-17 MED ORDER — PHENYLEPHRINE 40 MCG/ML (10ML) SYRINGE FOR IV PUSH (FOR BLOOD PRESSURE SUPPORT)
PREFILLED_SYRINGE | INTRAVENOUS | Status: AC
Start: 2016-03-17 — End: 2016-03-17
  Filled 2016-03-17: qty 10

## 2016-03-17 MED ORDER — LIDOCAINE 2% (20 MG/ML) 5 ML SYRINGE
INTRAMUSCULAR | Status: DC | PRN
  Administered 2016-03-17: 80 mg via INTRAVENOUS

## 2016-03-17 MED ORDER — PROMETHAZINE HCL 25 MG/ML IJ SOLN: 6.2500 mg | INTRAMUSCULAR | Status: DC | PRN

## 2016-03-17 MED ORDER — LACTATED RINGERS IR SOLN
Status: DC | PRN
Start: 2016-03-17 — End: 2016-03-17
  Administered 2016-03-17: 1000 mL

## 2016-03-17 MED ORDER — SUGAMMADEX SODIUM 200 MG/2ML IV SOLN
INTRAVENOUS | Status: DC | PRN
  Administered 2016-03-17: 200 mg via INTRAVENOUS

## 2016-03-17 MED ORDER — ONDANSETRON HCL 4 MG/2ML IJ SOLN
INTRAMUSCULAR | Status: DC | PRN
  Administered 2016-03-17: 4 mg via INTRAVENOUS

## 2016-03-17 MED ORDER — ACETAMINOPHEN 10 MG/ML IV SOLN
INTRAVENOUS | Status: AC
  Filled 2016-03-17: qty 100

## 2016-03-17 MED ORDER — CEFOTETAN DISODIUM-DEXTROSE 2-2.08 GM-% IV SOLR
INTRAVENOUS | Status: AC
  Filled 2016-03-17: qty 50

## 2016-03-17 MED ORDER — HEPARIN SODIUM (PORCINE) 5000 UNIT/ML IJ SOLN: 5000.0000 [IU] | Freq: Three times a day (TID) | INTRAMUSCULAR | Status: DC

## 2016-03-17 MED ORDER — ONDANSETRON HCL 4 MG/2ML IJ SOLN
INTRAMUSCULAR | Status: AC
  Filled 2016-03-17: qty 2

## 2016-03-17 MED ORDER — HYDROMORPHONE HCL 1 MG/ML IJ SOLN
0.2500 mg | INTRAMUSCULAR | Status: DC | PRN
Start: 2016-03-17 — End: 2016-03-17
  Administered 2016-03-17 (×4): 0.5 mg via INTRAVENOUS

## 2016-03-17 MED ORDER — PROPOFOL 10 MG/ML IV BOLUS
INTRAVENOUS | Status: DC | PRN
  Administered 2016-03-17: 200 mg via INTRAVENOUS

## 2016-03-17 MED ORDER — PHENYLEPHRINE HCL 10 MG/ML IJ SOLN
INTRAMUSCULAR | Status: DC | PRN
  Administered 2016-03-17 (×2): 80 ug via INTRAVENOUS
  Administered 2016-03-17: 160 ug via INTRAVENOUS

## 2016-03-17 MED ORDER — ROCURONIUM BROMIDE 50 MG/5ML IV SOLN
INTRAVENOUS | Status: AC
  Filled 2016-03-17: qty 2

## 2016-03-17 MED ORDER — FENTANYL CITRATE (PF) 100 MCG/2ML IJ SOLN
INTRAMUSCULAR | Status: DC | PRN
  Administered 2016-03-17: 50 ug via INTRAVENOUS
  Administered 2016-03-17: 150 ug via INTRAVENOUS
  Administered 2016-03-17: 50 ug via INTRAVENOUS

## 2016-03-17 MED ORDER — ONDANSETRON HCL 4 MG/2ML IJ SOLN: 4.0000 mg | INTRAMUSCULAR | Status: DC | PRN

## 2016-03-17 MED ORDER — INSULIN ASPART 100 UNIT/ML ~~LOC~~ SOLN
0.0000 [IU] | SUBCUTANEOUS | Status: DC
  Administered 2016-03-17: 7 [IU] via SUBCUTANEOUS
  Administered 2016-03-17: 4 [IU] via SUBCUTANEOUS
  Administered 2016-03-17 – 2016-03-18 (×2): 7 [IU] via SUBCUTANEOUS
  Administered 2016-03-18 (×2): 4 [IU] via SUBCUTANEOUS

## 2016-03-17 MED ORDER — HYDROMORPHONE HCL 1 MG/ML IJ SOLN
INTRAMUSCULAR | Status: AC
  Filled 2016-03-17: qty 1

## 2016-03-17 MED ORDER — OXYCODONE HCL 5 MG/5ML PO SOLN
5.0000 mg | ORAL | Status: DC | PRN
  Administered 2016-03-18 (×3): 10 mg via ORAL
  Filled 2016-03-17 (×2): qty 10
  Filled 2016-03-17: qty 50

## 2016-03-17 MED ORDER — SUGAMMADEX SODIUM 200 MG/2ML IV SOLN
INTRAVENOUS | Status: AC
Start: 2016-03-17 — End: 2016-03-17
  Filled 2016-03-17: qty 2

## 2016-03-17 MED ORDER — CEFOTETAN DISODIUM-DEXTROSE 2-2.08 GM-% IV SOLR
2.0000 g | INTRAVENOUS | Status: AC
  Administered 2016-03-17: 2 g via INTRAVENOUS

## 2016-03-17 MED ORDER — FENTANYL CITRATE (PF) 250 MCG/5ML IJ SOLN
INTRAMUSCULAR | Status: AC
  Filled 2016-03-17: qty 5

## 2016-03-17 MED ORDER — DEXAMETHASONE SODIUM PHOSPHATE 10 MG/ML IJ SOLN
INTRAMUSCULAR | Status: AC
  Filled 2016-03-17: qty 1

## 2016-03-17 MED ORDER — MIDAZOLAM HCL 2 MG/2ML IJ SOLN
INTRAMUSCULAR | Status: AC
  Filled 2016-03-17: qty 2

## 2016-03-17 MED ORDER — ROCURONIUM BROMIDE 100 MG/10ML IV SOLN
INTRAVENOUS | Status: DC | PRN
  Administered 2016-03-17: 50 mg via INTRAVENOUS

## 2016-03-17 MED ORDER — BUPIVACAINE-EPINEPHRINE 0.25% -1:200000 IJ SOLN
INTRAMUSCULAR | Status: AC
Start: 2016-03-17 — End: 2016-03-17
  Filled 2016-03-17: qty 1

## 2016-03-17 MED ORDER — PROPOFOL 10 MG/ML IV BOLUS
INTRAVENOUS | Status: AC
  Filled 2016-03-17: qty 20

## 2016-03-17 MED ORDER — MORPHINE SULFATE (PF) 2 MG/ML IV SOLN
2.0000 mg | INTRAVENOUS | Status: DC | PRN
  Administered 2016-03-17 (×2): 4 mg via INTRAVENOUS
  Filled 2016-03-17 (×2): qty 2

## 2016-03-17 MED ORDER — TISSEEL VH 10 ML EX KIT
PACK | CUTANEOUS | Status: AC
  Filled 2016-03-17: qty 2

## 2016-03-17 MED ORDER — MIDAZOLAM HCL 5 MG/5ML IJ SOLN
INTRAMUSCULAR | Status: DC | PRN
  Administered 2016-03-17: 2 mg via INTRAVENOUS

## 2016-03-17 MED ORDER — ACETAMINOPHEN 10 MG/ML IV SOLN
INTRAVENOUS | Status: DC | PRN
  Administered 2016-03-17: 1000 mg via INTRAVENOUS

## 2016-03-17 MED ORDER — ACETAMINOPHEN 160 MG/5ML PO SOLN: 650.0000 mg | ORAL | Status: DC | PRN

## 2016-03-17 MED ORDER — LIDOCAINE HCL (CARDIAC) 20 MG/ML IV SOLN
INTRAVENOUS | Status: AC
  Filled 2016-03-17: qty 5

## 2016-03-17 MED ORDER — ACETAMINOPHEN 160 MG/5ML PO SOLN: 325.0000 mg | ORAL | Status: DC | PRN

## 2016-03-17 MED ORDER — SUCCINYLCHOLINE CHLORIDE 200 MG/10ML IV SOSY
PREFILLED_SYRINGE | INTRAVENOUS | Status: DC | PRN
  Administered 2016-03-17: 140 mg via INTRAVENOUS

## 2016-03-17 MED ORDER — HEPARIN SODIUM (PORCINE) 5000 UNIT/ML IJ SOLN
5000.0000 [IU] | Freq: Three times a day (TID) | INTRAMUSCULAR | Status: DC
  Administered 2016-03-17 – 2016-03-18 (×2): 5000 [IU] via SUBCUTANEOUS
  Filled 2016-03-17 (×2): qty 1

## 2016-03-17 MED ORDER — LACTATED RINGERS IV SOLN
INTRAVENOUS | Status: DC
  Administered 2016-03-17 (×3): via INTRAVENOUS

## 2016-03-17 MED ORDER — HEPARIN SODIUM (PORCINE) 5000 UNIT/ML IJ SOLN
5000.0000 [IU] | INTRAMUSCULAR | Status: AC
  Administered 2016-03-17: 5000 [IU] via SUBCUTANEOUS
  Filled 2016-03-17: qty 1

## 2016-03-17 MED ORDER — TISSEEL VH 10 ML EX KIT
PACK | CUTANEOUS | Status: DC | PRN
  Administered 2016-03-17: 10 mL

## 2016-03-17 MED ORDER — INSULIN ASPART 100 UNIT/ML ~~LOC~~ SOLN
SUBCUTANEOUS | Status: AC
  Filled 2016-03-17: qty 1

## 2016-03-17 MED ORDER — DEXAMETHASONE SODIUM PHOSPHATE 10 MG/ML IJ SOLN
INTRAMUSCULAR | Status: DC | PRN
  Administered 2016-03-17: 10 mg via INTRAVENOUS

## 2016-03-17 MED ORDER — KCL IN DEXTROSE-NACL 20-5-0.45 MEQ/L-%-% IV SOLN
INTRAVENOUS | Status: DC
  Administered 2016-03-17 – 2016-03-18 (×2): via INTRAVENOUS
  Filled 2016-03-17 (×4): qty 1000

## 2016-03-17 MED ORDER — CHLORHEXIDINE GLUCONATE 4 % EX LIQD: 60.0000 mL | Freq: Once | CUTANEOUS | Status: DC

## 2016-03-17 MED ORDER — BUPIVACAINE HCL 0.25 % IJ SOLN
INTRAMUSCULAR | Status: DC | PRN
Start: 2016-03-17 — End: 2016-03-17
  Administered 2016-03-17: 50 mL

## 2016-03-17 MED ORDER — PREMIER PROTEIN SHAKE: 2.0000 [oz_av] | Freq: Four times a day (QID) | ORAL | Status: DC

## 2016-03-17 MED ORDER — EPHEDRINE SULFATE 50 MG/ML IJ SOLN
INTRAMUSCULAR | Status: AC
  Filled 2016-03-17: qty 1

## 2016-03-17 MED ORDER — EPHEDRINE SULFATE 50 MG/ML IJ SOLN
INTRAMUSCULAR | Status: DC | PRN
  Administered 2016-03-17: 20 mg via INTRAVENOUS

## 2016-03-17 SURGICAL SUPPLY — 60 items
APL SRG 32X5 SNPLK LF DISP (MISCELLANEOUS) ×1
APPLICATOR COTTON TIP 6IN STRL (MISCELLANEOUS) IMPLANT
APPLIER CLIP ROT 10 11.4 M/L (STAPLE)
APPLIER CLIP ROT 13.4 12 LRG (CLIP)
BLADE SURG 15 STRL LF DISP TIS (BLADE) ×2 IMPLANT
BLADE SURG 15 STRL SS (BLADE) ×1
CABLE HIGH FREQUENCY MONO STRZ (ELECTRODE) ×3 IMPLANT
CHLORAPREP W/TINT 26ML (MISCELLANEOUS) ×6 IMPLANT
CLIP APPLIE ROT 10 11.4 M/L (STAPLE) IMPLANT
CLIP APPLIE ROT 13.4 12 LRG (CLIP) IMPLANT
COVER SURGICAL LIGHT HANDLE (MISCELLANEOUS) IMPLANT
DEVICE SUT QUICK LOAD TK 5 (STAPLE) IMPLANT
DEVICE SUT TI-KNOT TK 5X26 (MISCELLANEOUS) IMPLANT
DEVICE SUTURE ENDOST 10MM (ENDOMECHANICALS) IMPLANT
DEVICE TROCAR PUNCTURE CLOSURE (ENDOMECHANICALS) ×3 IMPLANT
DISSECTOR BLUNT TIP ENDO 5MM (MISCELLANEOUS) IMPLANT
DRAPE UTILITY XL STRL (DRAPES) ×6 IMPLANT
ELECT REM PT RETURN 9FT ADLT (ELECTROSURGICAL) ×3
ELECTRODE REM PT RTRN 9FT ADLT (ELECTROSURGICAL) ×2 IMPLANT
GAUZE SPONGE 4X4 12PLY STRL (GAUZE/BANDAGES/DRESSINGS) IMPLANT
GLOVE SURG SIGNA 7.5 PF LTX (GLOVE) ×3 IMPLANT
GOWN STRL REUS W/TWL XL LVL3 (GOWN DISPOSABLE) ×15 IMPLANT
HOVERMATT SINGLE USE (MISCELLANEOUS) ×3 IMPLANT
KIT BASIN OR (CUSTOM PROCEDURE TRAY) ×3 IMPLANT
LIQUID BAND (GAUZE/BANDAGES/DRESSINGS) ×3 IMPLANT
MARKER SKIN DUAL TIP RULER LAB (MISCELLANEOUS) ×3 IMPLANT
NEEDLE SPNL 22GX3.5 QUINCKE BK (NEEDLE) ×3 IMPLANT
PACK UNIVERSAL I (CUSTOM PROCEDURE TRAY) ×3 IMPLANT
RELOAD STAPLER BLUE 60MM (STAPLE) ×4 IMPLANT
RELOAD STAPLER GOLD 60MM (STAPLE) ×4 IMPLANT
RELOAD STAPLER GREEN 60MM (STAPLE) ×4 IMPLANT
SCISSORS LAP 5X35 DISP (ENDOMECHANICALS) ×3 IMPLANT
SEALANT SURGICAL APPL DUAL CAN (MISCELLANEOUS) ×3 IMPLANT
SET IRRIG TUBING LAPAROSCOPIC (IRRIGATION / IRRIGATOR) ×3 IMPLANT
SHEARS HARMONIC ACE PLUS 45CM (MISCELLANEOUS) ×3 IMPLANT
SLEEVE ADV FIXATION 5X100MM (TROCAR) ×3 IMPLANT
SLEEVE GASTRECTOMY 36FR VISIGI (MISCELLANEOUS) ×3 IMPLANT
SOLUTION ANTI FOG 6CC (MISCELLANEOUS) ×3 IMPLANT
SPONGE LAP 18X18 X RAY DECT (DISPOSABLE) ×3 IMPLANT
STAPLER ECHELON LONG 60 440 (INSTRUMENTS) ×3 IMPLANT
STAPLER RELOAD BLUE 60MM (STAPLE) ×6
STAPLER RELOAD GOLD 60MM (STAPLE) ×6
STAPLER RELOAD GREEN 60MM (STAPLE) ×6
SUT MNCRL AB 4-0 PS2 18 (SUTURE) ×3 IMPLANT
SUT SURGIDAC NAB ES-9 0 48 120 (SUTURE) IMPLANT
SUT VICRYL 0 TIES 12 18 (SUTURE) ×3 IMPLANT
SYR 10ML ECCENTRIC (SYRINGE) ×3 IMPLANT
SYR 20CC LL (SYRINGE) ×3 IMPLANT
TOWEL OR 17X26 10 PK STRL BLUE (TOWEL DISPOSABLE) ×3 IMPLANT
TOWEL OR NON WOVEN STRL DISP B (DISPOSABLE) ×3 IMPLANT
TRAY FOLEY W/METER SILVER 14FR (SET/KITS/TRAYS/PACK) IMPLANT
TRAY FOLEY W/METER SILVER 16FR (SET/KITS/TRAYS/PACK) IMPLANT
TROCAR ADV FIXATION 12X100MM (TROCAR) ×3 IMPLANT
TROCAR ADV FIXATION 5X100MM (TROCAR) ×3 IMPLANT
TROCAR BLADELESS 15MM (ENDOMECHANICALS) ×3 IMPLANT
TROCAR BLADELESS OPT 5 100 (ENDOMECHANICALS) ×3 IMPLANT
TROCAR XCEL 12X100 BLDLESS (ENDOMECHANICALS) ×3 IMPLANT
TUBING CONNECTING 10 (TUBING) ×3 IMPLANT
TUBING ENDO SMARTCAP PENTAX (MISCELLANEOUS) ×3 IMPLANT
TUBING INSUF HEATED (TUBING) ×3 IMPLANT

## 2016-03-17 NOTE — Anesthesia Postprocedure Evaluation (Signed)
Anesthesia Post Note  Patient: Ariel ScrapeRita T Dudzinski  Procedure(s) Performed: Procedure(s) (LRB): LAPAROSCOPIC GASTRIC SLEEVE RESECTION WITH UPPER ENDOSCOPY (N/A) UPPER GI ENDOSCOPY  Patient location during evaluation: PACU Anesthesia Type: General Level of consciousness: awake Pain management: pain level controlled Respiratory status: spontaneous breathing Cardiovascular status: stable Anesthetic complications: no    Last Vitals:  Filed Vitals:   03/17/16 1201  BP: 128/61  Pulse: 95  Temp: 36.8 C  Resp: 15    Last Pain:  Filed Vitals:   03/17/16 1207  PainSc: Asleep                 EDWARDS,Randall Rampersad

## 2016-03-17 NOTE — Op Note (Signed)
PATIENT:   Ariel [name] DOB:   1971-03-18 MRN:   [id number]  DATE OF PROCEDURE: 03/17/2016                   FACILITY:  [facility]  OPERATIVE REPORT  PREOPERATIVE DIAGNOSIS:  Morbid obesity.  POSTOPERATIVE DIAGNOSIS:  Morbid obesity (weight 259, BMI of 42.4).  PROCEDURE:  Laparoscopic Sleeve gastrectomy (intraoperative upper endoscopy by Dr. Leonard Schwartz. Hoxworth)  SURGEON:  Sandria Bales. Ezzard Standing, MD  FIRST ASSISTANT:  B. Hoxworth, MD  ANESTHESIA:  General endotracheal.  CRNA: Jhonnie Garner, CRNA  General  ESTIMATED BLOOD LOSS:  Minimal.  LOCAL ANESTHESIA:  30 cc of 1/4% Marcaine  COMPLICATIONS:  None.  INDICATION FOR SURGERY:  Ariel [name] is a 45 y.o. AA  female who sees Ariel Antis, MD as her primary care doctor.  She has completed our preoperative bariatric program and now comes for a laparoscopic Sleeve gastrectomy.  The indications, potential complications of surgery were explained to the patient.  Potential complications of the surgery include, but are not limited to, bleeding, infection, DVT, open surgery, and long-term nutritional consequences.  OPERATIVE NOTE:  The patient taken to room #1 at [facility] where [name] underwent a general endotracheal anesthetic, supervised by CRNA: Jhonnie Garner, CRNA.  The patient was given 2 g of cefoxitin at the beginning of the procedure.  A time-out was held and surgical checklist run.  I accessed her abdominal cavity through the left upper quadrant with a 5 mm Optiview. I did an abdominal exploration.   Her omentum and bowel were unremarkable. The right and left lobes of the liver unremarkable.  She did have a large lobe of the left lobe of the liver. Gallbladder was normal. Her stomach was unremarkable.   I placed a total of 6 trocars. I placed a 5 mm left lateral trocar, a 5 mm left paramedian trocar (for the scope), a 12 mm right paramedian torcar, a 5 mm right subcostal trocar that I converted to a 15 mm to extract the stomach, and 5 mm  subxiphoid trocar for the liver retractor.  I did inspect the proximal stomach and GE junction and saw no evidence of a hiatal hernia.  I started out taking down the greater curvature attachments of the stomach. I measured approximately 6 cm proximal from the pylorus and mobilized the greater curvature of the stomach with the Harmonic Scalpel. I took this dissection cranially around the greater curvature of her stomach to the angle of His and the left crus.   After I had mobilized the greater curvature of the stomach, I then passed the 36 French ViSiGi bougie which was used to suck the stomach up against the lesser curvature and placed into the antrum. During the staple firing,  I tried to give the ViSiGi a cuff at least about 1 cm. I tried to avoid narrowing the incisura. I used a total of 6 staple firings.  From antrum to the angle of His, I used 2 green, 2 gold and 2 blue Eschelon 60 mm Ethicon staplers. I did not use staple line reinforcement.   At each firing of the EndoGIA stapler, I inspected the stomach, anterior wall of the stomach, and underneath to make sure there was no compromise or impingement on to the ViSiGi bougie.   The staple line seemed linear without any corkscrewing of the stomach. Hemostasis was good. I did not use any reinforcement. She had no areas of bleeding along the staple line.  Because I thought we had a good staple line, I then had the ViSiGi was converted to insufflate the pouch. The new stomach pouch was placed under water. There was no bubbling or leak noted.   At this point, Dr. Johna SheriffHoxworth broke scrub and passed an upper endoscope down through the esophagus into the stomach pouch. The stomach was tubular and somewhat curved. There was no narrowing of the stomach pouch or angulation. We were easily able to pass the endoscope into the antrum and again put air pressure on the staple line. I irrigated the upper abdomen with saline. There was no bubbling or evidence of air  leak. The mucosa looked viable. Dr. Johna SheriffHoxworth decompressed the stomach with the endoscopy.   I converted to right subcostal trocar to a 15 trocar and extracted the stomach remnant through this intact and sent this to Pathology. I then placed 10 cc of Tisseel along the new greater curvature staple line and covered the entire staple line with the Tisseel.  I aspirated out the saline that I had irrigated because I thought the staple line looked viable and complete. There was no evidence of leak. I did not leave a drain in place.   I did not close the 15 mm trocar, it appeared at an angulation which left a small defect. I infiltrated 30 cc of 1/4% marcaine with epinephrine. I closed the skin at each site with a 4-0 Monocryl, painted each wound with LiquiBand.   The patient was transported to recovery room in good condition. Sponge and needle count were correct at the end of the case.    Ariel Kinavid Ellowyn Rieves, MD, [facility] Pager: [phone] Office phone:  [phone]

## 2016-03-17 NOTE — Progress Notes (Signed)
Lab results noted- Hgb. 11.4- Hct.. 34.9

## 2016-03-17 NOTE — Transfer of Care (Signed)
Immediate Anesthesia Transfer of Care Note  Patient: Ariel [name]  Procedure(s) Performed: Procedure(s): LAPAROSCOPIC GASTRIC SLEEVE RESECTION WITH UPPER ENDOSCOPY (N/A) UPPER GI ENDOSCOPY  Patient Location: PACU  Anesthesia Type:General  Level of Consciousness: awake, alert  and oriented  Airway & Oxygen Therapy: Patient Spontanous Breathing and Patient connected to face mask oxygen  Post-op Assessment: Report given to RN  Post vital signs: Reviewed  Last Vitals: There were no vitals filed for this visit.  Last Pain: There were no vitals filed for this visit.    Patients Stated Pain Goal: 3 (03/17/16 0827)  Complications: No apparent anesthesia complications

## 2016-03-17 NOTE — Progress Notes (Signed)
Hgb. And Hct. Drawn by lab. 

## 2016-03-17 NOTE — Anesthesia Preprocedure Evaluation (Addendum)
Anesthesia Evaluation  Patient identified by MRN, date of birth, ID band Patient awake    Reviewed: Allergy & Precautions, NPO status   Airway Mallampati: II  TM Distance: >3 FB     Dental   Pulmonary    breath sounds clear to auscultation       Cardiovascular hypertension,  Rhythm:Regular Rate:Normal     Neuro/Psych    GI/Hepatic Neg liver ROS, GERD  ,  Endo/Other  diabetes  Renal/GU negative Renal ROS     Musculoskeletal   Abdominal   Peds  Hematology   Anesthesia Other Findings   Reproductive/Obstetrics                            Anesthesia Physical Anesthesia Plan  ASA: II  Anesthesia Plan: General   Post-op Pain Management:    Induction: Intravenous  Airway Management Planned: Oral ETT  Additional Equipment:   Intra-op Plan:   Post-operative Plan:   Informed Consent: I have reviewed the patients History and Physical, chart, labs and discussed the procedure including the risks, benefits and alternatives for the proposed anesthesia with the patient or authorized representative who has indicated his/her understanding and acceptance.   Dental advisory given  Plan Discussed with: CRNA and Anesthesiologist  Anesthesia Plan Comments:         Anesthesia Quick Evaluation

## 2016-03-17 NOTE — Interval H&P Note (Signed)
History and Physical Interval Note:  03/17/2016 9:36 AM  Ariel [name]  has presented today for surgery, with the diagnosis of MORBID OBESITY  The various methods of treatment have been discussed with the patient and family. Her mother and sister are here with her.  After consideration of risks, benefits and other options for treatment, the patient has consented to  Procedure(s): LAPAROSCOPIC GASTRIC SLEEVE RESECTION WITH UPPER ENDOSCOPY (N/A) as a surgical intervention .  The patient's history has been reviewed, patient examined, no change in status, stable for surgery.  I have reviewed the patient's chart and labs.  Questions were answered to the patient's satisfaction.     Harlow Carrizales H

## 2016-03-17 NOTE — Anesthesia Procedure Notes (Signed)
Procedure Name: Intubation Date/Time: 03/17/2016 10:18 AM Performed by: Jhonnie GarnerMARSHALL, Hurschel Paynter M Pre-anesthesia Checklist: Patient identified, Emergency Drugs available, Suction available and Patient being monitored Patient Re-evaluated:Patient Re-evaluated prior to inductionOxygen Delivery Method: Circle system utilized Preoxygenation: Pre-oxygenation with 100% oxygen Intubation Type: IV induction Ventilation: Mask ventilation without difficulty Laryngoscope Size: Mac and 3 Grade View: Grade II Tube type: Oral Tube size: 7.0 mm Number of attempts: 1 Airway Equipment and Method: Stylet Placement Confirmation: ETT inserted through vocal cords under direct vision,  positive ETCO2 and breath sounds checked- equal and bilateral Secured at: 21 cm Tube secured with: Tape Dental Injury: Teeth and Oropharynx as per pre-operative assessment

## 2016-03-18 LAB — CBC WITH DIFFERENTIAL/PLATELET
Basophils Absolute: 0 10*3/uL (ref 0.0–0.1)
Basophils Relative: 0 %
EOS ABS: 0 10*3/uL (ref 0.0–0.7)
EOS PCT: 0 %
HCT: 33.8 % — ABNORMAL LOW (ref 36.0–46.0)
Hemoglobin: 11.2 g/dL — ABNORMAL LOW (ref 12.0–15.0)
LYMPHS ABS: 1.1 10*3/uL (ref 0.7–4.0)
Lymphocytes Relative: 10 %
MCH: 25.6 pg — AB (ref 26.0–34.0)
MCHC: 33.1 g/dL (ref 30.0–36.0)
MCV: 77.3 fL — ABNORMAL LOW (ref 78.0–100.0)
MONOS PCT: 5 %
Monocytes Absolute: 0.6 10*3/uL (ref 0.1–1.0)
Neutro Abs: 8.8 10*3/uL — ABNORMAL HIGH (ref 1.7–7.7)
Neutrophils Relative %: 85 %
PLATELETS: 310 10*3/uL (ref 150–400)
RBC: 4.37 MIL/uL (ref 3.87–5.11)
RDW: 13.9 % (ref 11.5–15.5)
WBC: 10.5 10*3/uL (ref 4.0–10.5)

## 2016-03-18 LAB — GLUCOSE, CAPILLARY
GLUCOSE-CAPILLARY: 184 mg/dL — AB (ref 65–99)
Glucose-Capillary: 159 mg/dL — ABNORMAL HIGH (ref 65–99)
Glucose-Capillary: 118 mg/dL — ABNORMAL HIGH (ref 65–99)

## 2016-03-18 MED ORDER — PREMIER PROTEIN SHAKE
2.0000 [oz_av] | Freq: Four times a day (QID) | ORAL | Status: DC
  Administered 2016-03-18: 2 [oz_av] via ORAL
  Filled 2016-03-18: qty 325.31

## 2016-03-18 NOTE — Discharge Summary (Signed)
Physician Discharge Summary  Patient ID:  Ariel [name]  MRN: [id number]  DOB/AGE: [birth year] 45 y.o.  Admit date: 03/17/2016 Discharge date: 03/18/2016  Discharge Diagnoses:  1. OBESITY, MORBID, BMI 40.0-49.9 2. HTN 3.  Diabetes   Active Problems:   Morbid obesity with BMI of 40.0-44.9, adult ([place])  Operation: Procedure(s): LAPAROSCOPIC GASTRIC SLEEVE RESECTION WITH UPPER ENDOSCOPY on 03/17/2016 - D. Lucia Gaskins  Discharged Condition: good  Hospital Course: Ariel [name] is an 45 y.o. female whose primary care physician is Vic Blackbird, MD and who was admitted 03/17/2016 with a chief complaint of Morbid obesity.  She has completed our bariatric pre op program. She was brought to the operating room on 03/17/2016 and underwent  LAPAROSCOPIC GASTRIC SLEEVE RESECTION WITH UPPER ENDOSCOPY.   She did well enough that I started her on water the night of surgery. She has tolerated this, so she will take the protein shakes today.  If tolerated, she will go home later today.  The discharge instructions were reviewed with the patient.  Consults: None  Significant Diagnostic Studies: Results for orders placed or performed during the hospital encounter of 03/17/16  Glucose, capillary  Result Value Ref Range   Glucose-Capillary 144 (H) 65 - 99 mg/dL  Pregnancy, urine STAT morning of surgery  Result Value Ref Range   Preg Test, Ur NEGATIVE NEGATIVE  Glucose, capillary  Result Value Ref Range   Glucose-Capillary 208 (H) 65 - 99 mg/dL   Comment 1 Notify RN    Comment 2 Document in Chart   Hemoglobin and hematocrit, blood  Result Value Ref Range   Hemoglobin 11.4 (L) 12.0 - 15.0 g/dL   HCT 34.9 (L) 36.0 - 46.0 %  Glucose, capillary  Result Value Ref Range   Glucose-Capillary 195 (H) 65 - 99 mg/dL  CBC WITH DIFFERENTIAL  Result Value Ref Range   WBC 10.5 4.0 - 10.5 K/uL   RBC 4.37 3.87 - 5.11 MIL/uL   Hemoglobin 11.2 (L) 12.0 - 15.0 g/dL   HCT 33.8 (L) 36.0 - 46.0 %   MCV 77.3 (L)  78.0 - 100.0 fL   MCH 25.6 (L) 26.0 - 34.0 pg   MCHC 33.1 30.0 - 36.0 g/dL   RDW 13.9 11.5 - 15.5 %   Platelets 310 150 - 400 K/uL   Neutrophils Relative % 85 %   Neutro Abs 8.8 (H) 1.7 - 7.7 K/uL   Lymphocytes Relative 10 %   Lymphs Abs 1.1 0.7 - 4.0 K/uL   Monocytes Relative 5 %   Monocytes Absolute 0.6 0.1 - 1.0 K/uL   Eosinophils Relative 0 %   Eosinophils Absolute 0.0 0.0 - 0.7 K/uL   Basophils Relative 0 %   Basophils Absolute 0.0 0.0 - 0.1 K/uL  Glucose, capillary  Result Value Ref Range   Glucose-Capillary 215 (H) 65 - 99 mg/dL   Comment 1 Notify RN   Glucose, capillary  Result Value Ref Range   Glucose-Capillary 205 (H) 65 - 99 mg/dL   Comment 1 Notify RN   Glucose, capillary  Result Value Ref Range   Glucose-Capillary 184 (H) 65 - 99 mg/dL   Comment 1 Notify RN    Comment 2 Document in Chart   Glucose, capillary  Result Value Ref Range   Glucose-Capillary 159 (H) 65 - 99 mg/dL    No results found.  Discharge Exam:  Filed Vitals:   03/18/16 0200 03/18/16 0415  BP: 143/56 136/77  Pulse: 78 79  Temp: 97.9 F (36.6  C) 98.1 F (36.7 C)  Resp: 16 16    General: Obese AA F who is alert and generally healthy appearing.  Lungs: Clear to auscultation and symmetric breath sounds. Heart:  RRR. No murmur or rub. Abdomen: Soft. No mass. Normal bowel sounds. Incisions look good.  Discharge Medications:     Medication List    TAKE these medications        acetaminophen 325 MG tablet  Commonly known as:  TYLENOL  Take 650 mg by mouth every 6 (six) hours as needed (For pain.).     blood glucose meter kit and supplies Kit  Monitor FSBS once daily in the AM. Dx: 250.00     fluticasone 50 MCG/ACT nasal spray  Commonly known as:  FLONASE  Place 2 sprays into both nostrils daily.     glipiZIDE 5 MG tablet  Commonly known as:  GLUCOTROL  Take 1 tablet (5 mg total) by mouth 2 (two) times daily before a meal.  Notes to Patient:  Monitor Blood Sugar Frequently  and keep a log for primary care physician, you may need to adjust medication dosage with rapid weight loss.        hydrochlorothiazide 25 MG tablet  Commonly known as:  HYDRODIURIL  Take 1 tablet (25 mg total) by mouth daily.  Notes to Patient:  Monitor Blood Pressure Daily and keep a log for primary care physician.  Monitor for symptoms of dehydration.  You may need to make changes to your medications with rapid weight loss.        multivitamin with minerals Tabs tablet  Take 1 tablet by mouth daily.     nebivolol 5 MG tablet  Commonly known as:  BYSTOLIC  Take 1 tablet (5 mg total) by mouth daily.  Notes to Patient:  Monitor Blood Pressure Daily and keep a log for primary care physician.  You may need to make changes to your medications with rapid weight loss.        ONE TOUCH ULTRA TEST test strip  Generic drug:  glucose blood     ONETOUCH DELICA LANCETS 16X Misc        Disposition: 01-Home or Self Care      Discharge Instructions    Ambulate hourly while awake    Complete by:  As directed      Call MD for:  difficulty breathing, headache or visual disturbances    Complete by:  As directed      Call MD for:  persistant dizziness or light-headedness    Complete by:  As directed      Call MD for:  persistant nausea and vomiting    Complete by:  As directed      Call MD for:  redness, tenderness, or signs of infection (pain, swelling, redness, odor or green/yellow discharge around incision site)    Complete by:  As directed      Call MD for:  severe uncontrolled pain    Complete by:  As directed      Call MD for:  temperature >101 F    Complete by:  As directed      Diet bariatric full liquid    Complete by:  As directed      Incentive spirometry    Complete by:  As directed   Perform hourly while awake           Follow-up Information    Follow up with [facility] H, MD. Go on 04/02/2016.  Specialty:  General Surgery   Why:  For Post-Op Check at 9:15    Contact information:   [place] [address] [phone]       Follow up with [facility] H, MD. Go on 04/29/2016.   Specialty:  General Surgery   Why:  For Post-Op Check at 9:15   Contact information:   [address]  [place] [address] [phone]      Activity: Driving - May drive in 3 or 4 days, if doing well and off pain meds  Lifting - No lifting more than 15 pounds for 7 days, then no limit   Wound Care: May shower starting tomorrow  Diet: Post sleeve diet   Follow up appointment: Call Dr. Pollie Friar office [facility]) at [phone] for an appointment in 2 weeks.  Medications and dosages:  Resume your home medications.  You have a prescription for: Oxycodone, zofran   Signed: Alphonsa Overall, M.D., [facility] Office:  [phone]  03/18/2016, 7:51 AM

## 2016-03-18 NOTE — Plan of Care (Signed)
Problem: Food- and Nutrition-Related Knowledge Deficit (NB-1.1) Goal: Nutrition education Formal process to instruct or train a patient/client in a skill or to impart knowledge to help patients/clients voluntarily manage or modify food choices and eating behavior to maintain or improve health. Outcome: Completed/Met Date Met:  03/18/16 Nutrition Education Note  Received consult for diet education per DROP protocol.   Discussed 2 week post op diet with pt. Emphasized that liquids must be non carbonated, non caffeinated, and sugar free. Fluid goals discussed. Reviewed progression of diet to include soft proteins at 7-10 days post-op. Pt to follow up with outpatient bariatric RD for further diet progression after 2 weeks. Multivitamins and minerals also reviewed. Teach back method used, pt expressed understanding, expect good compliance.   Diet: First 2 Weeks  You will see the dietitian about two (2) weeks after your surgery. The dietitian will increase the types of foods you can eat if you are handling liquids well:  If you have severe vomiting or nausea and cannot handle clear liquids lasting longer than 1 day, call your surgeon  Protein Shake  Drink at least 2 ounces of shake 5-6 times per day  Each serving of protein shakes (usually 8 - 12 ounces) should have a minimum of:  15 grams of protein  And no more than 5 grams of carbohydrate  Goal for protein each day:  Men = 80 grams per day  Women = 60 grams per day  Protein powder may be added to fluids such as non-fat milk or Lactaid milk or Soy milk (limit to 35 grams added protein powder per serving)   Hydration  Slowly increase the amount of water and other clear liquids as tolerated (See Acceptable Fluids)  Slowly increase the amount of protein shake as tolerated  Sip fluids slowly and throughout the day  May use sugar substitutes in small amounts (no more than 6 - 8 packets per day; i.e. Splenda)   Fluid Goal  The first goal is to  drink at least 8 ounces of protein shake/drink per day (or as directed by the nutritionist); some examples of protein shakes are [employer], [employer], EAS Edge HP, and Unjury. See handout from pre-op Bariatric Education Class:  Slowly increase the amount of protein shake you drink as tolerated  You may find it easier to slowly sip shakes throughout the day  It is important to get your proteins in first  Your fluid goal is to drink 64 - 100 ounces of fluid daily  It may take a few weeks to build up to this  32 oz (or more) should be clear liquids  And  32 oz (or more) should be full liquids (see below for examples)  Liquids should not contain sugar, caffeine, or carbonation   Clear Liquids:  Water or Sugar-free flavored water (i.e. Fruit H2O, Propel)  Decaffeinated coffee or tea (sugar-free)  Crystal Lite, Wyler's Lite, Minute Maid Lite  Sugar-free Jell-O  Bouillon or broth  Sugar-free Popsicle: *Less than 20 calories each; Limit 1 per day   Full Liquids:  Protein Shakes/Drinks + 2 choices per day of other full liquids  Full liquids must be:  No More Than 12 grams of Carbs per serving  No More Than 3 grams of Fat per serving  Strained low-fat cream soup  Non-Fat milk  Fat-free Lactaid Milk  Sugar-free yogurt (Dannon Lite & Fit, Greek yogurt)     Clayton Bibles, MS, RD, LDN Pager: [phone] After Hours Pager: [phone]

## 2016-03-18 NOTE — Discharge Instructions (Signed)
° ° ° °GASTRIC BYPASS/SLEEVE ° Home Care Instructions ° ° These instructions are to help you care for yourself when you go home. ° °Call: If you have any problems. °• Call [phone] and ask for the surgeon on call °• If you need immediate assistance come to the ER at [place]. Tell the ER staff you are a new post-op gastric bypass or gastric sleeve patient  °Signs and symptoms to report: • Severe  vomiting or nausea °o If you cannot handle clear liquids for longer than 1 day, call your surgeon °• Abdominal pain which does not get better after taking your pain medication °• Fever greater than 100.4°  F and chills °• Heart rate over 100 beats a minute °• Trouble breathing °• Chest pain °• Redness,  swelling, drainage, or foul odor at incision (surgical) sites °• If your incisions open or pull apart °• Swelling or pain in calf (lower leg) °• Diarrhea (Loose bowel movements that happen often), frequent watery, uncontrolled bowel movements °• Constipation, (no bowel movements for 3 days) if this happens: °o Take Milk of Magnesia, 2 tablespoons by mouth, 3 times a day for 2 days if needed °o Stop taking Milk of Magnesia once you have had a bowel movement °o Call your doctor if constipation continues °Or °o Take Miralax  (instead of Milk of Magnesia) following the label instructions °o Stop taking Miralax once you have had a bowel movement °o Call your doctor if constipation continues °• Anything you think is “abnormal for you” °  °Normal side effects after surgery: • Unable to sleep at night or unable to concentrate °• Irritability °• Being tearful (crying) or depressed ° °These are common complaints, possibly related to your anesthesia, stress of surgery, and change in lifestyle, that usually go away a few weeks after surgery. If these feelings continue, call your medical doctor.  °Wound Care: You may have surgical glue, steri-strips, or staples over your incisions after surgery °• Surgical glue: Looks like clear  film over your incisions and will wear off a little at a time °• Steri-strips: Adhesive strips of tape over your incisions. You may notice a yellowish color on skin under the steri-strips. This is used to make the steri-strips stick better. Do not pull the steri-strips off - let them fall off °• Staples: Staples may be removed before you leave the hospital °o If you go home with staples, call [facility] for an appointment with your surgeon’s nurse to have staples removed 10 days after surgery, [phone] °• Showering: You may shower two (2) days after your surgery unless your surgeon tells you differently °o Wash gently around incisions with warm soapy water, rinse well, and gently pat dry °o If you have a drain (tube from your incision), you may need someone to hold this while you shower °o No tub baths until staples are removed and incisions are healed °  °Medications: • Medications should be liquid or crushed if larger than the size of a dime °• Extended release pills (medication that releases a little bit at a time through the  day) should not be crushed °• Depending on the size and number of medications you take, you may need to space (take a few throughout the day)/change the time you take your medications so that you do not over-fill your pouch (smaller stomach) °• Make sure you follow-up with you primary care physician to make medication changes needed during rapid weight loss and life -style changes °•   If you have diabetes, follow up with your doctor that orders your diabetes medication(s) within one week after surgery and check your blood sugar regularly ° °• Do not drive while taking narcotics (pain medications) ° °• Do not take acetaminophen (Tylenol) and Roxicet or Lortab Elixir at the same time since these pain medications contain acetaminophen °  °Diet:  °First 2 Weeks You will see the nutritionist about two (2) weeks after your surgery. The nutritionist will increase the types of  foods you can eat if you are handling liquids well: °• If you have severe vomiting or nausea and cannot handle clear liquids lasting longer than 1 day call your surgeon °Protein Shake °• Drink at least 2 ounces of shake 5-6 times per day °• Each serving of protein shakes (usually 8-12 ounces) should have a minimum of: °o 15 grams of protein °o And no more than 5 grams of carbohydrate °• Goal for protein each day: °o Men = 80 grams per day °o Women = 60 grams per day °  ° • Protein powder may be added to fluids such as non-fat milk or Lactaid milk or Soy milk (limit to 35 grams added protein powder per serving) ° °Hydration °• Slowly increase the amount of water and other clear liquids as tolerated (See Acceptable Fluids) °• Slowly increase the amount of protein shake as tolerated °• Sip fluids slowly and throughout the day °• May use sugar substitutes in small amounts (no more than 6-8 packets per day; i.e. Splenda) ° °Fluid Goal °• The first goal is to drink at least 8 ounces of protein shake/drink per day (or as directed by the nutritionist); some examples of protein shakes are Syntrax Nectar, Adkins Advantage, EAS Edge HP, and Unjury. - See handout from pre-op Bariatric Education Class: °o Slowly increase the amount of protein shake you drink as tolerated °o You may find it easier to slowly sip shakes throughout the day °o It is important to get your proteins in first °• Your fluid goal is to drink 64-100 ounces of fluid daily °o It may take a few weeks to build up to this  °• 32 oz. (or more) should be clear liquids °And °• 32 oz. (or more) should be full liquids (see below for examples) °• Liquids should not contain sugar, caffeine, or carbonation ° °Clear Liquids: °• Water of Sugar-free flavored water (i.e. Fruit H²O, Propel) °• Decaffeinated coffee or tea (sugar-free) °• Crystal lite, Wyler’s Lite, Minute Maid Lite °• Sugar-free Jell-O °• Bouillon or broth °• Sugar-free Popsicle:    - Less than 20 calories  each; Limit 1 per day ° °Full Liquids: °                  Protein Shakes/Drinks + 2 choices per day of other full liquids °• Full liquids must be: °o No More Than 12 grams of Carbs per serving °o No More Than 3 grams of Fat per serving °• Strained low-fat cream soup °• Non-Fat milk °• Fat-free Lactaid Milk °• Sugar-free yogurt (Dannon Lite & Fit, Greek yogurt) ° °  °Vitamins and Minerals • Start 1 day after surgery unless otherwise directed by your surgeon °• 2 Chewable Multivitamin / Multimineral Supplement with iron (i.e. Centrum for Adults) °• Vitamin B-12, 350-500 micrograms sub-lingual (place tablet under the tongue) each day °• Chewable Calcium Citrate with Vitamin D-3 °(Example: 3 Chewable Calcium  Plus 600 with Vitamin D-3) °o Take 500 mg three (3) times a day for   a total of 1500 mg each day °o Do not take all 3 doses of calcium at one time as it may cause constipation, and you can only absorb 500 mg at a time °o Do not mix multivitamins containing iron with calcium supplements;  take 2 hours apart °o Do not substitute Tums (calcium carbonate) for your calcium °• Menstruating women and those at risk for anemia ( a blood disease that causes weakness) may need extra iron °o Talk to your doctor to see if you need more iron °• If you need extra iron: Total daily Iron recommendation (including Vitamins) is 50 to 100 mg Iron/day °• Do not stop taking or change any vitamins or minerals until you talk to your nutritionist or surgeon °• Your nutritionist and/or surgeon must approve all vitamin and mineral supplements °  °Activity and Exercise: It is important to continue walking at home. Limit your physical activity as instructed by your doctor. During this time, use these guidelines: °• Do not lift anything greater than ten  (10) pounds for at least two (2) weeks °• Do not go back to work or drive until your surgeon says you can °• You may have sex when you feel comfortable °o It is VERY important for female  patients to use a reliable birth control method; fertility often increase after surgery °o Do not get pregnant for at least 18 months °• Start exercising as soon as your doctor tells you that you can °o Make sure your doctor approves any physical activity °• Start with a simple walking program °• Walk 5-15 minutes each day, 7 days per week °• Slowly increase until you are walking 30-45 minutes per day °• Consider joining our BELT program. [phone] or email [email or web address] °  °Special Instructions Things to remember: °• Free counseling is available for you and your family through collaboration between [place] [facility] and [employer]. Please call [phone] and leave a message °• Use your CPAP when sleeping if this applies to you °• Consider buying a medical alert bracelet that says you had lap-band surgery °  °  You will likely have your first fill (fluid added to your band) 6 - 8 weeks after surgery °• [facility] has a free Bariatric Surgery Support Group that meets monthly, the 3rd Thursday, 6pm. [facility] Classrooms. You can see classes online at [email or web address] °• It is very important to keep all follow up appointments with your surgeon, nutritionist, primary care physician, and behavioral health practitioner °o After the first year, please follow up with your bariatric surgeon and nutritionist at least once a year in order to maintain best weight loss results °      °             [facility]:  [phone] ° °             [facility]: [phone] ° °             Bariatric Nurse Coordinator: [phone]  °Gastric Bypass/Sleeve Home Care Instructions  Rev. 11/2012    ° °                                                    Reviewed and Endorsed °                                                     by [facility] Patient Education Committee, Jan, 2014  [facility] - DISCHARGE INSTRUCTIONS TO PATIENT  Activity:   Driving - May drive in 3 or 4 days, if doing well and off pain meds   Lifting - No lifting more than 15 pounds for 7 days, then no limit  Wound Care:   May shower starting tomorrow  Diet:  Post sleeve diet  Follow up appointment:  Call Dr. Allene PyoNewman's office [facility]) at [phone] for an appointment in 2 weeks.  Medications and dosages:  Resume your home medications.  You have a prescription for:  Oxycodone, zofran  Call Dr. Ezzard StandingNewman or his office  [phone]) if you have:  Temperature greater than 100.4,  Persistent nausea and vomiting,  Severe uncontrolled pain,  Redness, tenderness, or signs of infection (pain, swelling, redness, odor or green/yellow discharge around the site),  Difficulty breathing, headache or visual disturbances,  Any other questions or concerns you may have after discharge.  In an emergency, call 911 or go to an Emergency Department at a nearby hospital.

## 2016-03-18 NOTE — Progress Notes (Signed)
Patient alert and oriented, pain is controlled. Patient is tolerating fluids, advanced to protein shake today, patient had one episode of small amount of emesis, but has had no further issues and is now tolerating shakes well.  Reviewed Gastric sleeve discharge instructions with patient and patient is able to articulate understanding.  Provided information on BELT program, Support Group and [facility] outpatient pharmacy. All questions answered, will continue to monitor.

## 2016-03-25 ENCOUNTER — Encounter: Payer: Self-pay | Admitting: Family Medicine

## 2016-03-25 ENCOUNTER — Ambulatory Visit (INDEPENDENT_AMBULATORY_CARE_PROVIDER_SITE_OTHER): Payer: BLUE CROSS/BLUE SHIELD | Admitting: Family Medicine

## 2016-03-25 VITALS — BP 128/78 | HR 88 | Temp 98.3°F | Resp 16 | Ht 68.0 in | Wt 244.0 lb

## 2016-03-25 DIAGNOSIS — E119 Type 2 diabetes mellitus without complications: Secondary | ICD-10-CM

## 2016-03-25 DIAGNOSIS — I10 Essential (primary) hypertension: Secondary | ICD-10-CM

## 2016-03-25 MED ORDER — CALCIUM CARBONATE-VITAMIN D 600-400 MG-UNIT PO CHEW: 2.0000 | CHEWABLE_TABLET | Freq: Every day | ORAL | Status: DC

## 2016-03-25 MED ORDER — GLIPIZIDE 5 MG PO TABS: 5.0000 mg | ORAL_TABLET | Freq: Every day | ORAL | Status: DC

## 2016-03-25 MED ORDER — VITAMIN B-12 500 MCG SL SUBL: 1.0000 | SUBLINGUAL_TABLET | Freq: Every day | SUBLINGUAL | Status: DC

## 2016-03-25 NOTE — Patient Instructions (Signed)
Decrease to glipizide 5mg  with breakfast Take the HCTZ only for blood pressure COntinue vitamins Cancel appt for July Schedule for Sept for F/U

## 2016-03-26 ENCOUNTER — Encounter: Payer: Self-pay | Admitting: Family Medicine

## 2016-03-26 NOTE — Assessment & Plan Note (Signed)
With her weight loss and liquid intake Decrease glipizide to 5mg  in morning only

## 2016-03-26 NOTE — Progress Notes (Signed)
Patient ID: Dayton ScrapeRita T Baba, female   DOB: 10/04/1971, 45 y.o.   MRN: [id number]   Subjective:    Patient ID: Dayton ScrapeRita T Tamm, female    DOB: 03/10/1971, 45 y.o.   MRN: [id number]  Patient presents for Medication Management  Pt here to discuss meds, s/p gastric bypass less than 2 weeks ago, weight down 15lbs, noticed blood pressure going down some, she stopped the bystolic has not had any palpitations. Her blood sugars have also been 110-120 she is on liquid diet.  No concerns otherwise, no siginificant pain, has f/u with surgery next week  Taking vitamins as prescribed     Review Of Systems:  GEN- denies fatigue, fever, weight loss,weakness, recent illness HEENT- denies eye drainage, change in vision, nasal discharge, CVS- denies chest pain, palpitations RESP- denies SOB, cough, wheeze ABD- denies N/V, change in stools, abd pain GU- denies dysuria, hematuria, dribbling, incontinence MSK- denies joint pain, muscle aches, injury Neuro- denies headache, dizziness, syncope, seizure activity       Objective:    BP 128/78 mmHg  Pulse 88  Temp(Src) 98.3 F (36.8 C) (Oral)  Resp 16  Ht 5\' 8"  (1.727 m)  Wt 244 lb (110.678 kg)  BMI 37.11 kg/m2  LMP 03/05/2016 GEN- NAD, alert and oriented x3 HEENT- PERRL, EOMI, non injected sclera, pink conjunctiva, MMM, oropharynx clear CVS- RRR, no murmur RESP-CTAB ABD-NABS,soft,NT,ND, port incisions d/c/i EXT-trace pedal edema Pulses- Radial, DP- 2+        Assessment & Plan:      Problem List Items Addressed This Visit    Essential hypertension, benign    BP looks good off bystolic, she has not had any symptoms Keep HCTZ for now, she will monitor BP at home      Diabetes mellitus (HCC) - Primary    With her weight loss and liquid intake Decrease glipizide to 5mg  in morning only       Relevant Medications   glipiZIDE (GLUCOTROL) 5 MG tablet      Note: This dictation was prepared with Dragon dictation along with smaller phrase  technology. Any transcriptional errors that result from this process are unintentional.

## 2016-03-26 NOTE — Assessment & Plan Note (Signed)
BP looks good off bystolic, she has not had any symptoms Keep HCTZ for now, she will monitor BP at home

## 2016-03-31 ENCOUNTER — Encounter: Payer: BLUE CROSS/BLUE SHIELD | Attending: Surgery

## 2016-04-01 NOTE — Progress Notes (Signed)
Bariatric Class:  Appt start time: 1530 end time:  1630.  2 Week Post-Operative Nutrition Class  Patient was seen on 03/31/2016 for Post-Operative Nutrition education at [facility].   Surgery date: 03/17/2016 Surgery type: Sleeve gastrectomy Start weight at [facility]: 251 lbs on 06/05/2015 Weight today: 261.2 lbs  TANITA  BODY COMP RESULTS  03/02/16 03/31/16   BMI (kg/m^2) 43.5 40.8   Fat Mass (lbs) 132.2 125.2   Fat Free Mass (lbs) 129 120.2   Total Body Water (lbs) 95 88.2    The following the learning objectives were met by the patient during this course:  Identifies Phase 3A (Soft, High Proteins) Dietary Goals and will begin from 2 weeks post-operatively to 2 months post-operatively  Identifies appropriate sources of fluids and proteins   States protein recommendations and appropriate sources post-operatively  Identifies the need for appropriate texture modifications, mastication, and bite sizes when consuming solids  Identifies appropriate multivitamin and calcium sources post-operatively  Describes the need for physical activity post-operatively and will follow MD recommendations  States when to call healthcare provider regarding medication questions or post-operative complications  Handouts given during class include:  Phase 3A: Soft, High Protein Diet Handout  Follow-Up Plan: Patient will follow-up at [facility] in 4 weeks for 6 week post-op nutrition visit for diet advancement per MD.

## 2016-04-15 ENCOUNTER — Ambulatory Visit (INDEPENDENT_AMBULATORY_CARE_PROVIDER_SITE_OTHER): Payer: BLUE CROSS/BLUE SHIELD | Admitting: Adult Health

## 2016-04-15 ENCOUNTER — Encounter: Payer: Self-pay | Admitting: Adult Health

## 2016-04-15 VITALS — BP 144/80 | HR 90 | Ht 65.5 in | Wt 241.0 lb

## 2016-04-15 DIAGNOSIS — B379 Candidiasis, unspecified: Secondary | ICD-10-CM | POA: Insufficient documentation

## 2016-04-15 DIAGNOSIS — N898 Other specified noninflammatory disorders of vagina: Secondary | ICD-10-CM | POA: Insufficient documentation

## 2016-04-15 DIAGNOSIS — L298 Other pruritus: Secondary | ICD-10-CM

## 2016-04-15 HISTORY — DX: Other specified noninflammatory disorders of vagina: N89.8

## 2016-04-15 HISTORY — DX: Candidiasis, unspecified: B37.9

## 2016-04-15 LAB — POCT WET PREP (WET MOUNT)

## 2016-04-15 MED ORDER — FLUCONAZOLE 150 MG PO TABS: ORAL_TABLET | ORAL | Status: DC

## 2016-04-15 NOTE — Patient Instructions (Signed)
Take diflucan  Follow up prn

## 2016-04-15 NOTE — Progress Notes (Signed)
Subjective:     Patient ID: Dayton ScrapeRita T Helmes, female   DOB: 01/15/1971, 45 y.o.   MRN: [id number]  HPI Grapevine SinkRita is a 45 year old black female in complaining of vaginal discharge and itching for about 3 weeks, she had sleeve surgery 3-4 weeks ago and has lost about 20 lbs.  Review of Systems +vaginal discharge + vaginal itching Reviewed past medical,surgical, social and family history. Reviewed medications and allergies.     Objective:   Physical Exam BP 144/80 mmHg  Pulse 90  Ht 5' 5.5" (1.664 m)  Wt 241 lb (109.317 kg)  BMI 39.48 kg/m2  LMP 03/08/2016 Skin warm and dry.Pelvic: external genitalia is normal in appearance no lesions, vagina: creamy discharge without odor,urethra has no lesions or masses noted, cervix:smooth and bulbous, uterus: normal size, shape and contour, non tender, no masses felt, adnexa: no masses or tenderness noted. Bladder is non tender and no masses felt. Wet prep: + for yeast and +WBCs    Assessment:     Vaginal discharge Vaginal itching  Yeast infection     Plan:     Rx diflucan 150 mg # 2 take 1 now and repeat 1 in 3 days if needed with 1 refill Follow up prn

## 2016-05-05 ENCOUNTER — Ambulatory Visit: Payer: BLUE CROSS/BLUE SHIELD | Admitting: Family Medicine

## 2016-05-12 ENCOUNTER — Ambulatory Visit: Payer: BLUE CROSS/BLUE SHIELD | Admitting: Dietician

## 2016-05-28 ENCOUNTER — Encounter: Payer: BLUE CROSS/BLUE SHIELD | Attending: Surgery | Admitting: Dietician

## 2016-05-28 ENCOUNTER — Encounter: Payer: Self-pay | Admitting: Dietician

## 2016-05-28 NOTE — Patient Instructions (Addendum)
Goals:  Follow Phase 3B: High Protein + Non-Starchy Vegetables  Eat 3-6 small meals/snacks, every 3-5 hrs  Increase lean protein foods to meet 60g goal  Increase fluid intake to 64oz +  Avoid drinking 15 minutes before, during and 30 minutes after eating  Aim for >30 min of physical activity daily  Talk to your doctor about diabetes medication  Test blood sugar if you have anything that could a symptom  Low blood sugar is below 70 mg/dl  If it is below 70, have 1/2 cup of juice or 3-4 hard pieces of candy  Test again in 15 minutes to see if sugar is above 70, have a snack with protein and carbs (peanut butter and crackers)  Surgery date: 03/17/2016 Surgery type: Sleeve gastrectomy [facility]: 251 lbs on 06/05/2015, 261.2 lbs on 03/31/2016 Weight today:  227.0 lbs  Weight change: 34.2 lbs Total weight loss: 34.2 lbs  TANITA  BODY COMP RESULTS  03/02/16 03/31/16 05/28/16   BMI (kg/m^2) 43.5 40.8 37.8   Fat Mass (lbs) 132.2 125.2 108.6   Fat Free Mass (lbs) 129 120.2 118.4   Total Body Water (lbs) 95 88.2 86.2

## 2016-05-28 NOTE — Progress Notes (Signed)
  Follow-up visit:  2 Months Post-Operative Sleeve Gastrectomy Surgery  Medical Nutrition Therapy:  Appt start time: 0825 end time:  0855.  Primary concerns today: Post-operative Bariatric Surgery Nutrition Management. Returns with a 34.2 lb weight loss in past 2 months. Ate some BBQ which didn't sit right but all other foods have been ok. Getting tired of eating the same foods.   Would like to do BELT.  Doesn't feel hungry.   Surgery date: 03/17/2016 Surgery type: Sleeve gastrectomy [facility] lbs on 06/05/2015, 261.2 lbs on 03/31/2016 Weight today:  227.0 lbs  Weight change: 34.2 lbs Total weight loss: 34.2 lbs  TANITA  BODY COMP RESULTS  03/02/16 03/31/16 05/28/16   BMI (kg/m^2) 43.5 40.8 37.8   Fat Mass (lbs) 132.2 125.2 108.6   Fat Free Mass (lbs) 129 120.2 118.4   Total Body Water (lbs) 95 88.2 86.2    Preferred Learning Style:   No preference indicated   Learning Readiness:   Ready  24-hr recall: B (AM): Premier Shake (30 g) walk Snk (AM): none or boiled egg or dannon light and fit (0-12g) L (PM): none or 1 oz grilled chicken (7 g) Snk (PM): yogurt (12 g)  D (PM): 1 oz Malawiturkey with mayo and tomato and 1 slice of bread (7 g) Snk (PM): SF popsicle  Has 2 protein shakes most days  Fluid intake: 22 oz protein shake, 51 oz water  Estimated total protein intake: at least 70 g per day  Medications: none Supplementation: taking  CBG monitoring: not regularly testing Average CBG per patient: N/A Last patient reported A1c: 7.6% in May 2017  Using straws: No Drinking while eating: tried it and won't do it again Hair loss: No Carbonated beverages: No N/V/D/C: having consipation Dumping syndrome: No  Recent physical activity:  Walking 2.5 miles 5 x week and works out some with machine  Progress Towards Goal(s):  In progress.  Handouts given during visit include:  High Protein    Nutritional Diagnosis:  [place]-3.3 Overweight/obesity related to past  poor dietary habits and physical inactivity as evidenced by patient w/ recent sleeve gastrectomy surgery following dietary guidelines for continued weight loss.    Intervention:  Nutrition education/diet advancement. Goals:  Follow Phase 3B: High Protein + Non-Starchy Vegetables  Eat 3-6 small meals/snacks, every 3-5 hrs  Increase lean protein foods to meet 60g goal  Increase fluid intake to 64oz +  Avoid drinking 15 minutes before, during and 30 minutes after eating  Aim for >30 min of physical activity daily  Talk to your doctor about diabetes medication  Test blood sugar if you have anything that could a symptom  Low blood sugar is below 70 mg/dl  If it is below 70, have 1/2 cup of juice or 3-4 hard pieces of candy  Test again in 15 minutes to see if sugar is above 70, have a snack with protein and carbs (peanut butter and crackers)  Teaching Method Utilized:  Visual Auditory Hands on  Barriers to learning/adherence to lifestyle change: none  Demonstrated degree of understanding via:  Teach Back   Monitoring/Evaluation:  Dietary intake, exercise, and body weight. Follow up in 2 months for 4 month post-op visit.

## 2016-06-29 ENCOUNTER — Ambulatory Visit: Payer: BLUE CROSS/BLUE SHIELD | Admitting: Family Medicine

## 2016-07-05 ENCOUNTER — Encounter (HOSPITAL_COMMUNITY): Payer: Self-pay | Admitting: Emergency Medicine

## 2016-07-05 ENCOUNTER — Emergency Department (HOSPITAL_COMMUNITY)
Admission: EM | Admit: 2016-07-05 | Discharge: 2016-07-05 | Disposition: A | Discharge status: Home / Self Care | Payer: BLUE CROSS/BLUE SHIELD | Attending: Dermatology | Admitting: Dermatology

## 2016-07-05 DIAGNOSIS — K5900 Constipation, unspecified: Secondary | ICD-10-CM | POA: Insufficient documentation

## 2016-07-05 DIAGNOSIS — Z5321 Procedure and treatment not carried out due to patient leaving prior to being seen by health care provider: Secondary | ICD-10-CM | POA: Diagnosis not present

## 2016-07-05 DIAGNOSIS — I10 Essential (primary) hypertension: Secondary | ICD-10-CM | POA: Insufficient documentation

## 2016-07-05 DIAGNOSIS — Z79899 Other long term (current) drug therapy: Secondary | ICD-10-CM | POA: Diagnosis not present

## 2016-07-05 DIAGNOSIS — E119 Type 2 diabetes mellitus without complications: Secondary | ICD-10-CM | POA: Insufficient documentation

## 2016-07-05 DIAGNOSIS — Z7984 Long term (current) use of oral hypoglycemic drugs: Secondary | ICD-10-CM | POA: Diagnosis not present

## 2016-07-05 NOTE — ED Triage Notes (Signed)
Pt states she has been constipated and today noticed bright red blood when she tried to go to the bathroom. Pt states there was a lot of blood.

## 2016-07-30 ENCOUNTER — Ambulatory Visit: Payer: BLUE CROSS/BLUE SHIELD | Admitting: Dietician

## 2016-08-23 IMAGING — CT CT ABD-PELV W/ CM
2 of 8 series · 15 of 46 positions shown, 17 images · IV contrast (Omnipaque 300)
Comparison: Abdominal ultrasound 06/13/2015.

CLINICAL DATA: Persistent left buttock abscess, question
inflammatory bowel disease, diabetes

EXAM:
CT ABDOMEN AND PELVIS WITH CONTRAST
TECHNIQUE: Multidetector CT imaging of the abdomen and pelvis was performed
using the standard protocol following bolus administration of
intravenous contrast.
CONTRAST:  100mL OMNIPAQUE IOHEXOL 300 MG/ML  SOLN

[Series 2: abd_pel_with 5.0 b40s · axial · 0.88mm/px · z∈[-557,-137]mm · 12 of 97 slices shown, 14 images]
[im 7/97  soft-tissue]
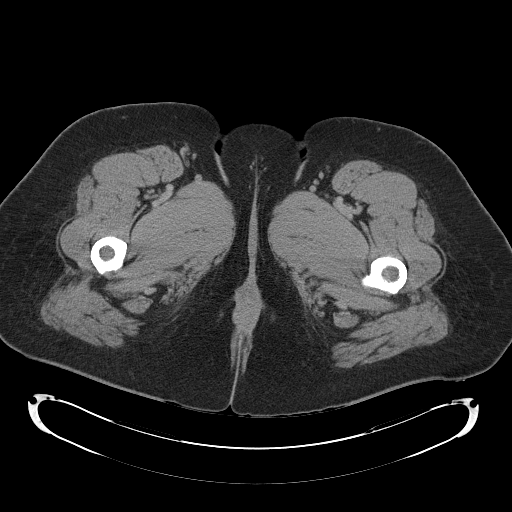
[im 7/97  bone]
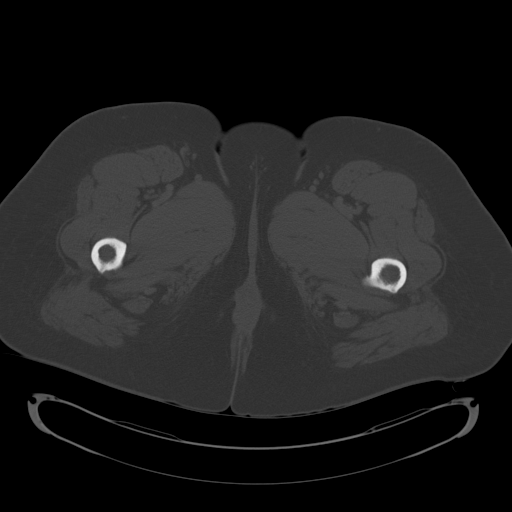
[im 13/97  soft-tissue]
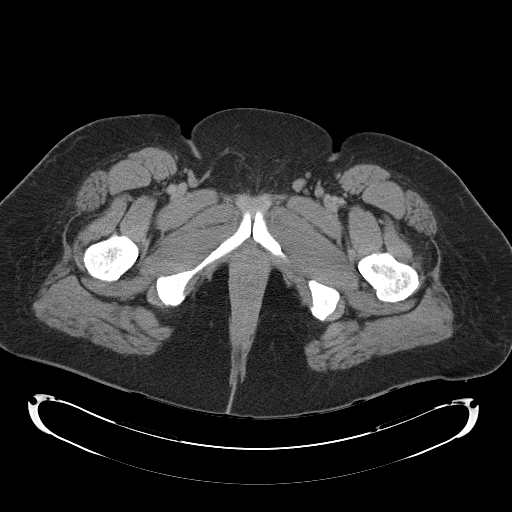
[im 25/97  soft-tissue]
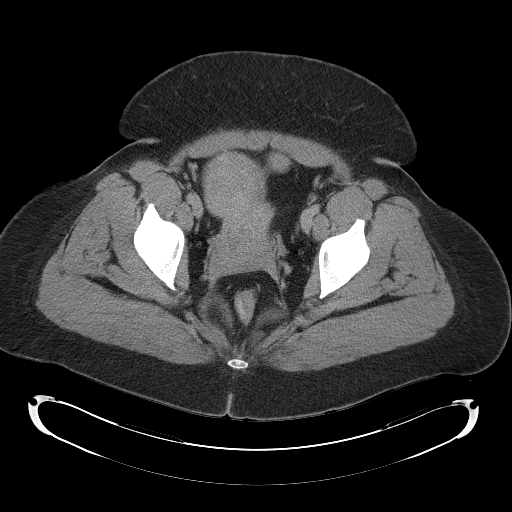
[im 31/97  soft-tissue]
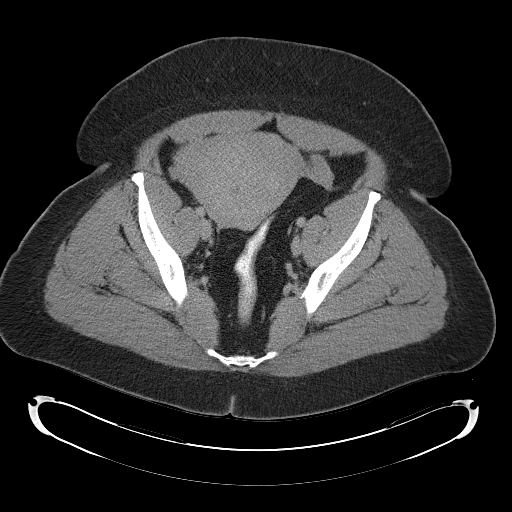
[im 37/97  soft-tissue]
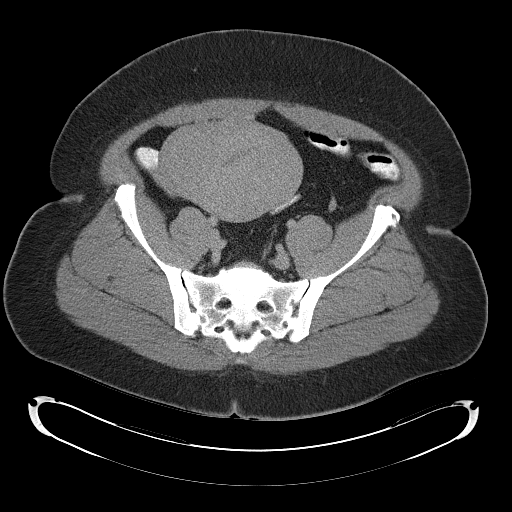
[im 43/97  soft-tissue]
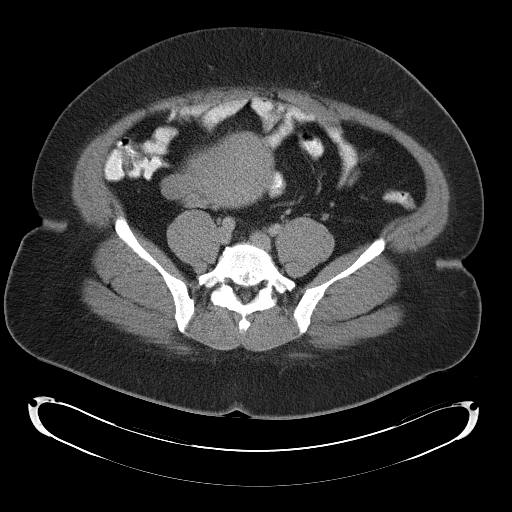
[im 55/97  soft-tissue]
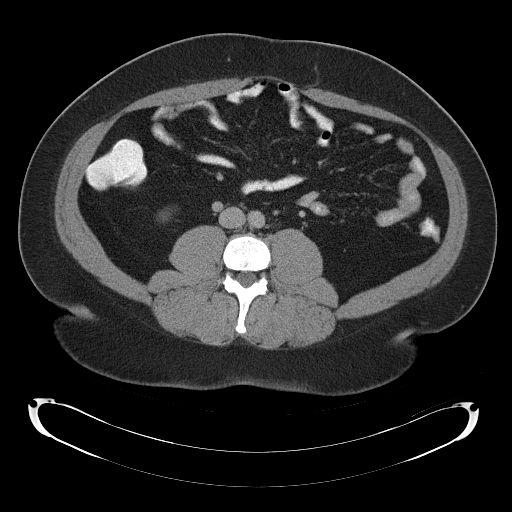
[im 61/97  soft-tissue]
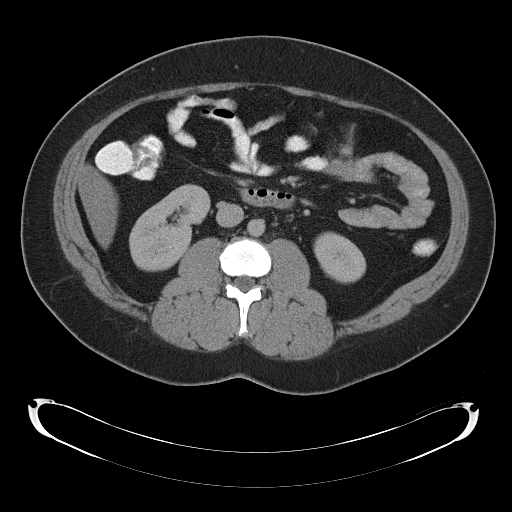
[im 67/97  soft-tissue]
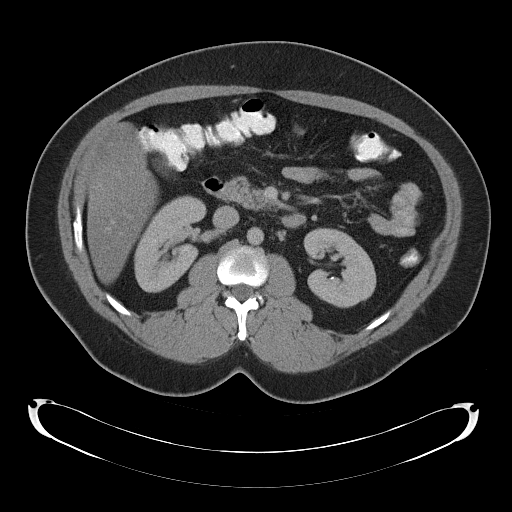
[im 67/97  bone]
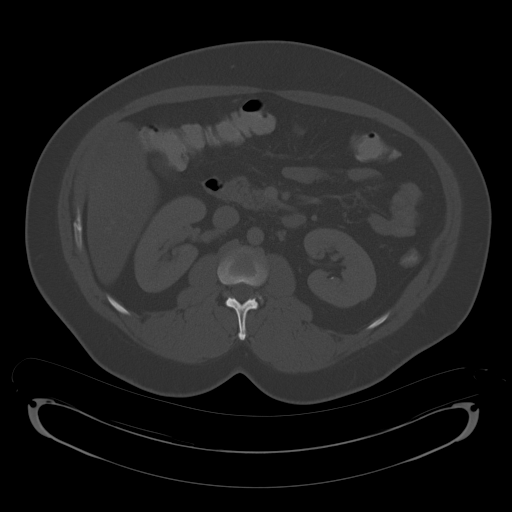
[im 73/97  soft-tissue]
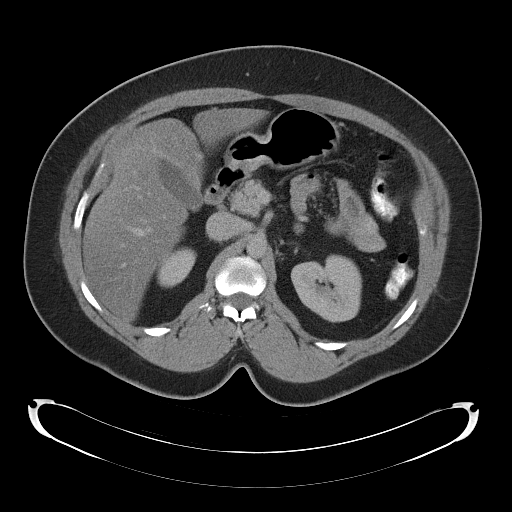
[im 85/97  soft-tissue]
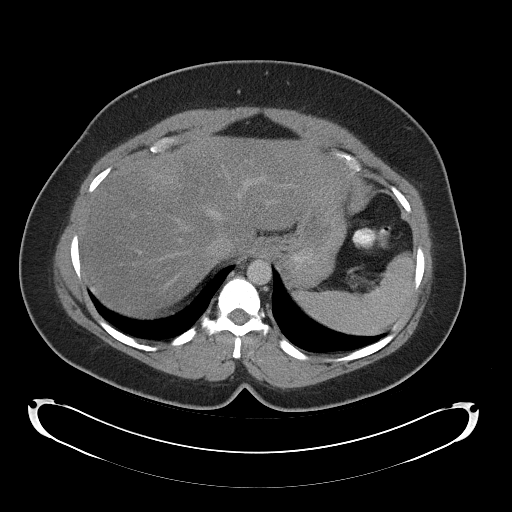
[im 91/97  soft-tissue]
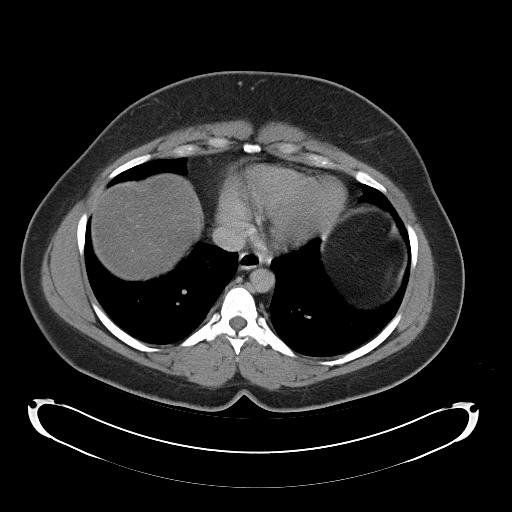

[Series 4: mpr cor post contrast (id) · coronal · 0.83mm/px · 3 of 109 slices shown]
[im 28/109  soft-tissue]
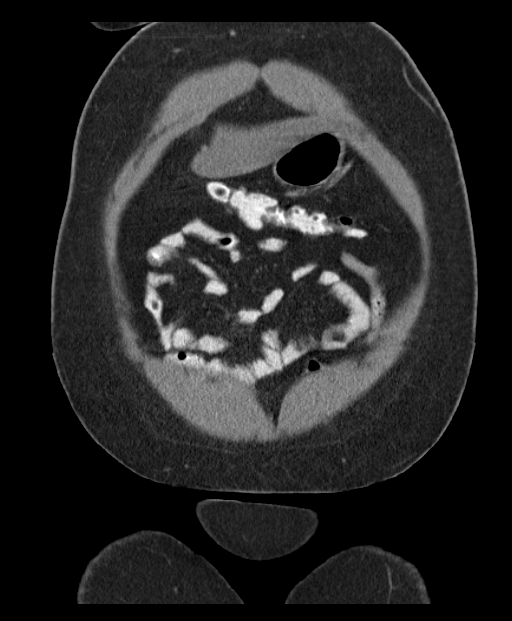
[im 55/109  soft-tissue]
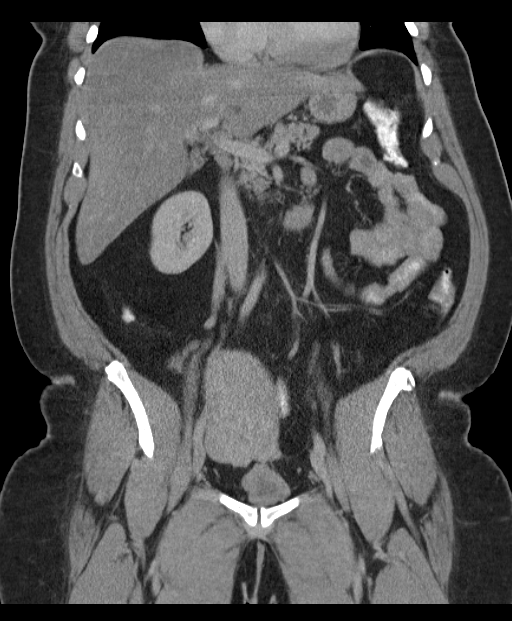
[im 82/109  soft-tissue]
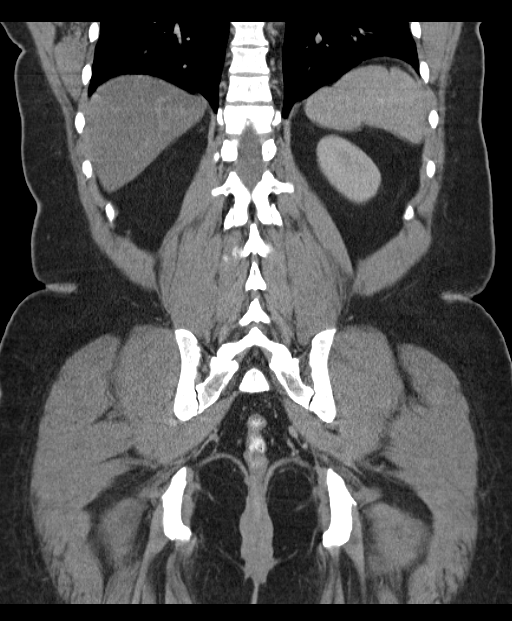

[15 of 46 positions shown; findings below may reference images not displayed]

FINDINGS: There are mild streaky artifacts from patient's large body habitus.
Sagittal images of the spine shows minimal degenerative changes
lower thoracic and upper lumbar spine. Mild fatty infiltration of
the liver.

The lung bases are unremarkable.

No calcified gallstones are noted within gallbladder.

The pancreas, spleen and adrenal glands are unremarkable. Kidneys
are symmetrical in size and enhancement. No hydronephrosis or
hydroureter.

There is nonobstructive calcified calculus in lower pole of the left
kidney measures 5 mm. Second nonobstructive calculus in lower pole
of the left kidney measures 2.5 mm. No calcified ureteral calculi
are noted.

No small bowel obstruction. Oral contrast material was given to the
patient. No ascites or free air. No adenopathy. There is mild
enlarged uterus with round appearance measures 14.4 cm length by
cm AP time an shin. There is somewhat nodular prominence of the
right fundus. Clinical correlation is necessary to exclude
myometrial fibroids. Further correlation with pelvic ultrasound and
correlation with GYN exam is recommended.

No adnexal masses noted.

There is no evidence of pelvic wall or gluteal region abscess.
Please note bilateral most lateral pelvic wall a is not included on
the scan due to patient's body habitus.

There is no pericecal inflammation. The terminal ileum is
unremarkable. Normal appendix is noted in axial image 49. The
urinary bladder is empty limiting its assessment. Small bilateral
inguinal lymph nodes are noted. The largest right inguinal lymph
node measures 1.3 by 0.9 cm. The largest left inguinal lymph node
measures 1.4 by 1 cm borderline probable reactive.

No distal colonic obstruction. No colitis or diverticulitis. There
is thickening of urinary bladder wall. This is most likely due to
collapsed empty bladder rather than cystitis.
IMPRESSION: 1. There is no evidence of acute inflammatory process within
abdomen.
2. Fatty infiltration of the liver.
3. No hydronephrosis or hydroureter. Left nonobstructive
nephrolithiasis.
4. There is prominent size uterus. Myometrial fibroids cannot be
excluded. Somewhat nodular prominence of the right fundus.
Correlation with GYN exam and further correlation with pelvic
ultrasound is recommended to exclude fibroids.
5. Normal appendix.  No pericecal inflammation.
6. No adnexal mass.
7. No small bowel obstruction. No colitis or diverticulitis. No
colonic obstruction.
8. There is no evidence of gluteal or pelvic wall abscess. Please
note the most lateral subcutaneous pelvic wall bilaterally is not
included on the scan due to patient's body habitus.
9. Borderline enlarged left inguinal lymph nodes probable reactive.

## 2017-05-25 ENCOUNTER — Ambulatory Visit (INDEPENDENT_AMBULATORY_CARE_PROVIDER_SITE_OTHER): Payer: 59 | Admitting: Family Medicine

## 2017-05-25 ENCOUNTER — Encounter: Payer: Self-pay | Admitting: Family Medicine

## 2017-05-25 VITALS — BP 152/78 | HR 78 | Temp 98.0°F | Resp 16 | Ht 65.5 in | Wt 196.0 lb

## 2017-05-25 DIAGNOSIS — E119 Type 2 diabetes mellitus without complications: Secondary | ICD-10-CM | POA: Diagnosis not present

## 2017-05-25 DIAGNOSIS — I10 Essential (primary) hypertension: Secondary | ICD-10-CM | POA: Diagnosis not present

## 2017-05-25 DIAGNOSIS — N390 Urinary tract infection, site not specified: Secondary | ICD-10-CM | POA: Diagnosis not present

## 2017-05-25 LAB — URINALYSIS, ROUTINE W REFLEX MICROSCOPIC
Bilirubin Urine: NEGATIVE
GLUCOSE, UA: NEGATIVE
KETONES UR: NEGATIVE
NITRITE: NEGATIVE
PH: 6 (ref 5.0–8.0)
PROTEIN: NEGATIVE
Specific Gravity, Urine: 1.025 (ref 1.001–1.035)

## 2017-05-25 MED ORDER — CEPHALEXIN 500 MG PO CAPS: 500.0000 mg | ORAL_CAPSULE | Freq: Four times a day (QID) | ORAL | 0 refills | Status: DC

## 2017-05-25 MED ORDER — HYDROCHLOROTHIAZIDE 12.5 MG PO CAPS: 12.5000 mg | ORAL_CAPSULE | Freq: Every day | ORAL | 3 refills | Status: DC

## 2017-05-25 NOTE — Patient Instructions (Signed)
Take the HCTZ once a day  F/U 1 month for blood pressure

## 2017-05-25 NOTE — Progress Notes (Signed)
   Subjective:    Patient ID: Ariel [name], female    DOB: 01/25/1971, 46 y.o.   MRN: [id number]  Patient presents for High BP (BP elevated at work physical (180's/ 80's)) and Dysuria (burning with urination)   Pt here with elevated BP at work. History of HTN, DM, taken off meds once she had gastric bypass, though last visit was in June 2017.  Still taking vitamins and B12.   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  Randomly checking at work and BP was in the 130's systolic     DM- diet controlled and wieth weight loss, has not checked her blood sugar   Had lost insurance last year, now with new job    LMP- last month    Lowest weight 192     Dysuria-  Past 2-3 weeks has burning with urination, , no abdominal pain, no blood in urine, no vaginal discharge, no OTC meds taken        Review Of Systems:  GEN- denies fatigue, fever, weight loss,weakness, recent illness HEENT- denies eye drainage, change in vision, nasal discharge, CVS- denies chest pain, palpitations RESP- denies SOB, cough, wheeze ABD- denies N/V, change in stools, abd pain GU- + dysuria, denies hematuria, dribbling, incontinence MSK- denies joint pain, muscle aches, injury Neuro- denies headache, dizziness, syncope, seizure activity       Objective:    BP (!) 152/78   Pulse 78   Temp 98 F (36.7 C) (Oral)   Resp 16   Ht 5' 5.5" (1.664 m)   Wt 196 lb (88.9 kg)   SpO2 97%   BMI 32.12 kg/m  GEN- NAD, alert and oriented x3 HEENT- PERRL, EOMI, non injected sclera, pink conjunctiva, MMM, oropharynx clear Neck- Supple, no thyromegaly CVS- RRR, no murmur RESP-CTAB ABD-NABS,soft,NT,ND, no CVA tenderness  EXT- No edema Pulses- Radial  2+        Assessment & Plan:      Problem List Items Addressed This Visit    Diabetes mellitus (HCC)   Relevant Orders   Basic metabolic panel (Completed)   CBC with Differential/Platelet (Completed)   Hemoglobin A1c (Completed)   Essential hypertension, benign    Restart HCTZ 12.5mg  daily Check labs  With weight loss, likley no longer diabetc, check A1C  F/u 1 month for BP      Relevant Medications   hydrochlorothiazide (MICROZIDE) 12.5 MG capsule   Other Relevant Orders   Basic metabolic panel (Completed)    Other Visit Diagnoses    Urinary tract infection without hematuria, site unspecified    -  Primary   Relevant Medications   cephALEXin (KEFLEX) 500 MG capsule   Other Relevant Orders   Urinalysis, Routine w reflex microscopic (Completed)      Note: This dictation was prepared with Dragon dictation along with smaller phrase technology. Any transcriptional errors that result from this process are unintentional.

## 2017-05-26 ENCOUNTER — Encounter: Payer: Self-pay | Admitting: *Deleted

## 2017-05-26 LAB — CBC WITH DIFFERENTIAL/PLATELET
BASOS ABS: 0 {cells}/uL (ref 0–200)
Basophils Relative: 0 %
EOS PCT: 2 %
Eosinophils Absolute: 108 cells/uL (ref 15–500)
HCT: 39.7 % (ref 35.0–45.0)
Hemoglobin: 13.2 g/dL (ref 12.0–15.0)
Lymphocytes Relative: 51 %
Lymphs Abs: 2754 cells/uL (ref 850–3900)
MCH: 27.4 pg (ref 27.0–33.0)
MCHC: 33.2 g/dL (ref 32.0–36.0)
MCV: 82.4 fL (ref 80.0–100.0)
MONOS PCT: 11 %
MPV: 10.4 fL (ref 7.5–12.5)
Monocytes Absolute: 594 cells/uL (ref 200–950)
NEUTROS ABS: 1944 {cells}/uL (ref 1500–7800)
NEUTROS PCT: 36 %
PLATELETS: 303 10*3/uL (ref 140–400)
RBC: 4.82 MIL/uL (ref 3.80–5.10)
RDW: 13.3 % (ref 11.0–15.0)
WBC: 5.4 10*3/uL (ref 3.8–10.8)

## 2017-05-26 LAB — BASIC METABOLIC PANEL
BUN: 9 mg/dL (ref 7–25)
CALCIUM: 9.3 mg/dL (ref 8.6–10.2)
CO2: 25 mmol/L (ref 20–32)
CREATININE: 0.83 mg/dL (ref 0.50–1.10)
Chloride: 102 mmol/L (ref 98–110)
GLUCOSE: 100 mg/dL — AB (ref 70–99)
Potassium: 3.7 mmol/L (ref 3.5–5.3)
Sodium: 137 mmol/L (ref 135–146)

## 2017-05-26 LAB — HEMOGLOBIN A1C
HEMOGLOBIN A1C: 5.2 % (ref <5.7)
Mean Plasma Glucose: 103 mg/dL

## 2017-05-27 LAB — URINALYSIS, MICROSCOPIC ONLY
CASTS: NONE SEEN [LPF]
Crystals: NONE SEEN [HPF]
YEAST: NONE SEEN [HPF]

## 2017-05-27 NOTE — Assessment & Plan Note (Signed)
Restart HCTZ 12.5mg  daily Check labs  With weight loss, likley no longer diabetc, check A1C  F/u 1 month for BP

## 2017-06-25 ENCOUNTER — Ambulatory Visit (INDEPENDENT_AMBULATORY_CARE_PROVIDER_SITE_OTHER): Payer: Managed Care, Other (non HMO) | Admitting: Family Medicine

## 2017-06-25 ENCOUNTER — Encounter: Payer: Self-pay | Admitting: Family Medicine

## 2017-06-25 VITALS — BP 138/76 | HR 81 | Temp 98.2°F | Resp 14 | Ht 66.0 in | Wt 205.4 lb

## 2017-06-25 DIAGNOSIS — E66812 Obesity, class 2: Secondary | ICD-10-CM | POA: Insufficient documentation

## 2017-06-25 DIAGNOSIS — E669 Obesity, unspecified: Secondary | ICD-10-CM | POA: Diagnosis not present

## 2017-06-25 DIAGNOSIS — Z23 Encounter for immunization: Secondary | ICD-10-CM

## 2017-06-25 DIAGNOSIS — I10 Essential (primary) hypertension: Secondary | ICD-10-CM

## 2017-06-25 MED ORDER — HYDROCHLOROTHIAZIDE 12.5 MG PO CAPS: 12.5000 mg | ORAL_CAPSULE | Freq: Every day | ORAL | 6 refills | Status: DC

## 2017-06-25 MED ORDER — FLUTICASONE PROPIONATE 50 MCG/ACT NA SUSP: 2.0000 | Freq: Every day | NASAL | 1 refills | Status: DC | PRN

## 2017-06-25 NOTE — Patient Instructions (Signed)
Keep the same dose of HCTZ Call me if >140/90 Flu shot done today  F/U 6 months

## 2017-06-25 NOTE — Progress Notes (Signed)
   Subjective:    Patient ID: Ariel [name], female    DOB: 05/25/1971, 46 y.o.   MRN: [id number]  Patient presents for BLOOD PRESSURE F/U  Here to follow-up blood pressure. Her last visit a month ago she was started back on hydrochlorothiazide 12.5 mg. Her blood pressure was increasing status post her bariatric weight loss surgery. I also checked her labs she's no longer diabetic completely normal A1c Weight was 196lbs 1 month ago , weight up 9lbs  She states her blood pressure was checked at work and was in the 120s over 70s. She admits to eating the wrong foods over the past month states today that she had bacon and then she had a hotdog for lunch. She has been taking her vitamins. She is also drinking water. She has not followed up with her weight loss surgeon since her insurance change.   Review Of Systems:  GEN- denies fatigue, fever, weight loss,weakness, recent illness HEENT- denies eye drainage, change in vision, nasal discharge, CVS- denies chest pain, palpitations RESP- denies SOB, cough, wheeze ABD- denies N/V, change in stools, abd pain GU- denies dysuria, hematuria, dribbling, incontinence MSK- denies joint pain, muscle aches, injury Neuro- denies headache, dizziness, syncope, seizure activity       Objective:    BP 138/76   Pulse 81   Temp 98.2 F (36.8 C) (Oral)   Resp 14   Ht  (1.676 m)   Wt 205 lb 6.4 oz (93.2 kg)   LMP 06/18/2017 (Approximate)   SpO2 99%   BMI 33.15 kg/m  GEN- NAD, alert and oriented x3 HEENT- PERRL, EOMI, non injected sclera, pink conjunctiva, MMM, oropharynx clear Neck- Supple, no thyromegaly CVS- RRR, no murmur RESP-CTAB EXT- No edema Pulses- Radial 2+        Assessment & Plan:      Problem List Items Addressed This Visit      Unprioritized   Obesity (BMI 30-39.9)   Essential hypertension, benign - Primary    Repeat blood pressure was normal. We'll have her continue to monitor at home. She will continue the  hydrochlorothiazide 12.5 mg. Her flu vaccine was also given. Discussed dietary changes that need to be made to keep her weight off. Low-carb low-salt special with her blood pressure. Discussed packing her lunch and being prepared with her snacks with more protein. She is also to make a follow-up appointment with her weight loss surgeon.      Relevant Medications   hydrochlorothiazide (MICROZIDE) 12.5 MG capsule    Other Visit Diagnoses    Flu vaccine need       Relevant Orders   Flu Vaccine QUAD 36+ mos IM (Completed)      Note: This dictation was prepared with Dragon dictation along with smaller phrase technology. Any transcriptional errors that result from this process are unintentional.

## 2017-06-25 NOTE — Assessment & Plan Note (Signed)
Repeat blood pressure was normal. We'll have her continue to monitor at home. She will continue the hydrochlorothiazide 12.5 mg. Her flu vaccine was also given. Discussed dietary changes that need to be made to keep her weight off. Low-carb low-salt special with her blood pressure. Discussed packing her lunch and being prepared with her snacks with more protein. She is also to make a follow-up appointment with her weight loss surgeon.

## 2017-09-09 ENCOUNTER — Other Ambulatory Visit: Payer: Self-pay | Admitting: Family Medicine

## 2017-09-09 DIAGNOSIS — Z1231 Encounter for screening mammogram for malignant neoplasm of breast: Secondary | ICD-10-CM

## 2017-09-10 ENCOUNTER — Ambulatory Visit (HOSPITAL_COMMUNITY)
Admission: RE | Admit: 2017-09-10 | Discharge: 2017-09-10 | Disposition: A | Discharge status: Home / Self Care | Payer: Managed Care, Other (non HMO) | Source: Ambulatory Visit | Attending: Family Medicine | Admitting: Family Medicine

## 2017-09-10 DIAGNOSIS — Z1231 Encounter for screening mammogram for malignant neoplasm of breast: Secondary | ICD-10-CM | POA: Diagnosis not present

## 2017-09-23 ENCOUNTER — Other Ambulatory Visit: Payer: 59 | Admitting: Adult Health

## 2017-10-21 ENCOUNTER — Encounter (HOSPITAL_COMMUNITY): Payer: Self-pay

## 2017-11-03 ENCOUNTER — Ambulatory Visit: Payer: Managed Care, Other (non HMO) | Admitting: Family Medicine

## 2017-12-24 ENCOUNTER — Ambulatory Visit: Payer: 59 | Admitting: Family Medicine

## 2017-12-30 DIAGNOSIS — N760 Acute vaginitis: Secondary | ICD-10-CM | POA: Diagnosis not present

## 2017-12-30 DIAGNOSIS — B9689 Other specified bacterial agents as the cause of diseases classified elsewhere: Secondary | ICD-10-CM | POA: Diagnosis not present

## 2017-12-30 DIAGNOSIS — Z111 Encounter for screening for respiratory tuberculosis: Secondary | ICD-10-CM | POA: Diagnosis not present

## 2017-12-30 DIAGNOSIS — R05 Cough: Secondary | ICD-10-CM | POA: Diagnosis not present

## 2017-12-31 ENCOUNTER — Other Ambulatory Visit: Payer: Self-pay

## 2017-12-31 ENCOUNTER — Encounter: Payer: Self-pay | Admitting: Family Medicine

## 2017-12-31 ENCOUNTER — Ambulatory Visit: Payer: 59 | Admitting: Family Medicine

## 2017-12-31 VITALS — BP 134/72 | HR 88 | Temp 98.7°F | Resp 14 | Ht 66.0 in | Wt 209.0 lb

## 2017-12-31 DIAGNOSIS — N760 Acute vaginitis: Secondary | ICD-10-CM

## 2017-12-31 DIAGNOSIS — R05 Cough: Secondary | ICD-10-CM

## 2017-12-31 DIAGNOSIS — Z111 Encounter for screening for respiratory tuberculosis: Secondary | ICD-10-CM | POA: Diagnosis not present

## 2017-12-31 DIAGNOSIS — B9689 Other specified bacterial agents as the cause of diseases classified elsewhere: Secondary | ICD-10-CM | POA: Diagnosis not present

## 2017-12-31 DIAGNOSIS — R059 Cough, unspecified: Secondary | ICD-10-CM

## 2017-12-31 LAB — WET PREP FOR TRICH, YEAST, CLUE

## 2017-12-31 MED ORDER — RANITIDINE HCL 150 MG PO TABS
150.0000 mg | ORAL_TABLET | Freq: Every day | ORAL | 3 refills | Status: DC
Start: 2017-12-31 — End: 2019-05-10

## 2017-12-31 MED ORDER — METRONIDAZOLE 500 MG PO TABS: 500.0000 mg | ORAL_TABLET | Freq: Two times a day (BID) | ORAL | 0 refills | Status: DC

## 2017-12-31 NOTE — Progress Notes (Signed)
   Subjective:    Patient ID: Ariel [name], female    DOB: 11/05/1970, 47 y.o.   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 Has allergies,does not feel sick       Review Of Systems:  GEN- denies fatigue, fever, weight loss,weakness, recent illness HEENT- denies eye drainage, change in vision, nasal discharge, CVS- denies chest pain, palpitations RESP- denies SOB, +cough, wheeze ABD- denies N/V, change in stools, abd pain GU- denies dysuria, hematuria, dribbling, incontinence MSK- denies joint pain, muscle aches, injury Neuro- denies headache, dizziness, syncope, seizure activity       Objective:    BP 134/72   Pulse 88   Temp 98.7 F (37.1 C) (Oral)   Resp 14   Ht 5\' 6"  (1.676 m)   Wt 209 lb (94.8 kg)   SpO2 99%   BMI 33.73 kg/m  GEN- NAD, alert and oriented x3 HEENT- PERRL, EOMI, non injected sclera, pink conjunctiva, MMM, oropharynx clear Neck- Supple, no thyromegaly CVS- RRR, no murmur RESP-CTAB ABD-NABS,soft,NT,ND GU- normal external genitalia, vaginal mucosa pink and moist, cervix visualized no growth, no blood form os, + discharge, no CMT, no ovarian masses, uterus normal size EXT- No edema Pulses- Radial  2+        Assessment & Plan:      Problem List Items Addressed This Visit    None    Visit Diagnoses    BV (bacterial vaginosis)    -  Primary   Treat with flagyl   Relevant Medications   metroNIDAZOLE (FLAGYL) 500 MG tablet   Other Relevant Orders   WET PREP FOR TRICH, YEAST, CLUE (Completed)   Cough       I think this is more allergy related,  she is to use both oral antihistamine and nasal steroid      Note: This dictation was prepared with Dragon dictation along with smaller phrase technology. Any transcriptional errors that result from this process are unintentional.

## 2017-12-31 NOTE — Patient Instructions (Addendum)
F/u 6 MONTHS for  Physical   

## 2018-01-02 ENCOUNTER — Encounter: Payer: Self-pay | Admitting: Family Medicine

## 2018-01-03 ENCOUNTER — Ambulatory Visit: Payer: 59

## 2018-01-03 DIAGNOSIS — Z111 Encounter for screening for respiratory tuberculosis: Secondary | ICD-10-CM

## 2018-01-03 LAB — TB SKIN TEST
INDURATION: 0 mm
TB Skin Test: NEGATIVE

## 2018-01-03 NOTE — Addendum Note (Signed)
Addended by: Phillips OdorSIX, CHRISTINA H on: 01/03/2018 03:20 PM   Modules accepted: Orders

## 2018-01-03 NOTE — Progress Notes (Signed)
Patient had TB test given on Friday by Trula Orehristina Six,LPN and ordered was put in today but given on 12/31/17. TB test was due to be read today.

## 2018-03-08 ENCOUNTER — Other Ambulatory Visit: Payer: Self-pay | Admitting: Family Medicine

## 2018-04-12 ENCOUNTER — Other Ambulatory Visit: Payer: Self-pay | Admitting: Family Medicine

## 2018-05-28 ENCOUNTER — Other Ambulatory Visit: Payer: Self-pay | Admitting: Family Medicine

## 2018-06-16 ENCOUNTER — Ambulatory Visit (INDEPENDENT_AMBULATORY_CARE_PROVIDER_SITE_OTHER): Payer: 59 | Admitting: Physician Assistant

## 2018-06-16 ENCOUNTER — Encounter: Payer: Self-pay | Admitting: Physician Assistant

## 2018-06-16 ENCOUNTER — Other Ambulatory Visit: Payer: Self-pay

## 2018-06-16 VITALS — BP 130/68 | HR 83 | Temp 97.8°F | Resp 16 | Ht 64.5 in | Wt 215.4 lb

## 2018-06-16 DIAGNOSIS — F410 Panic disorder [episodic paroxysmal anxiety] without agoraphobia: Secondary | ICD-10-CM | POA: Diagnosis not present

## 2018-06-16 MED ORDER — ALPRAZOLAM 0.25 MG PO TABS: 0.2500 mg | ORAL_TABLET | Freq: Two times a day (BID) | ORAL | 0 refills | Status: DC | PRN

## 2018-06-16 NOTE — Progress Notes (Signed)
Patient ID: Ariel [name] MRN: [id number], DOB: 03/30/1971, 47 y.o. Date of Encounter: 06/16/2018, 11:43 AM    Chief Complaint:  Chief Complaint  Patient presents with  . Shortness of Breath     HPI: 47 y.o. year old female presents with above.   Reports that she had an episode of some symptoms this past Monday that she was concerned about and that is the reason for today's visit. This past Monday was Labor Day. She states that Monday morning around 10 AM she "went and walked the Trail."  States that she walks that Trail 2-3 times per week.  Reports that she felt perfectly normal while walking the Trail.  Had no increased shortness of breath.  Had no palpitations.  Had no chest pressure or heaviness.  Had no allergy symptoms of itchy watery eyes or sneezing or rhinorrhea.  States that after she got home at about 1:00 she felt short of breath and felt anxiety and panic feeling.  States that she "just felt weird."  States that she did not feel any palpitations or sensation of her heart racing.  Says then she tried to call to schedule an appointment here but we were closed and says that she tried calling several medical offices and they were all closed for the holiday so thinks that that made her feel even more anxious.  Says that night she took 1 of her cousins anxiety pills in order to calm down to sleep. Says that after that medication, she fell asleep and slept fine and has felt "normal" / in usual state of health since then.  She states that she has no increased stress going on right now.  States that she was not worrying about anything and was not having any reason to be feeling anxious or panicky.  I asked if she works a job, what she does during a typical day. She works at [employer]. 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      Home Meds:   Outpatient Medications Prior to Visit  Medication Sig Dispense Refill  .  acetaminophen (TYLENOL) 325 MG tablet Take 650 mg by mouth every 6 (six) hours as needed (For pain.).    Marland Kitchen Calcium Carbonate-Vitamin D (CALCIUM 600/VITAMIN D) 600-400 MG-UNIT chew tablet Chew 2 tablets by mouth daily. 60 tablet 0  . Cyanocobalamin (VITAMIN B-12) 500 MCG SUBL Place 1 tablet (500 mcg total) under the tongue daily. 150 tablet   . fluticasone (FLONASE) 50 MCG/ACT nasal spray INSTILL 2 SPRAYS INTO EACH NOSTRIL DAILY. 16 g 0  . hydrochlorothiazide (MICROZIDE) 12.5 MG capsule TAKE ONE CAPSULE BY MOUTH ONCE DAILY. 30 capsule 0  . Multiple Vitamin (MULTIVITAMIN WITH MINERALS) TABS tablet Take 1 tablet by mouth daily.    . ranitidine (ZANTAC) 150 MG tablet Take 1 tablet (150 mg total) by mouth at bedtime. 30 tablet 3  . metroNIDAZOLE (FLAGYL) 500 MG tablet Take 1 tablet (500 mg total) by mouth 2 (two) times daily. 14 tablet 0   No facility-administered medications prior to visit.     Allergies: No Known Allergies    Review of Systems: See HPI for pertinent ROS. All other ROS negative.    Physical Exam: Blood pressure 130/68, pulse 83, temperature 97.8 F (36.6 C), temperature source Oral, resp. rate 16, height 5' 4.5" (1.638 m), weight 97.7 kg, last menstrual period 06/02/2018, SpO2 98 %., Body mass index is 36.4 kg/m. General:  AAF.  Appears in no acute distress. Neck: Supple. No thyromegaly. No lymphadenopathy. Lungs: Clear bilaterally to auscultation without wheezes, rales, or rhonchi. Breathing is unlabored. Heart: Regular rhythm. No murmurs, rubs, or gallops. Msk:  Strength and tone normal for age. Extremities/Skin: Warm and dry.  Neuro: Alert and oriented X 3. Moves all extremities spontaneously. Gait is normal. CNII-XII grossly in tact. Psych:  Responds to questions appropriately with a normal affect.     ASSESSMENT AND PLAN:  47 y.o. year old female with   1. Panic attack I reassured her that if symptoms wre related to her heart or lungs, that symptoms would worsen  with exertion. She has had no symptoms with physical exertion.  Also reassured her that BP, HR, Resp, Oxygen Sat normal today. She is to monitor symptoms. If has recurrent symptoms or additional symptoms, then f/u.  Otherwise, I will give her # 10 low dose Xanax to have available to use prn. - ALPRAZolam (XANAX) 0.25 MG tablet; Take 1 tablet (0.25 mg total) by mouth 2 (two) times daily as needed for anxiety.  Dispense: 10 tablet; Refill: 0   Signed, [address] [place], [place], [facility] 06/16/2018 11:43 AM

## 2018-07-08 ENCOUNTER — Telehealth: Payer: Self-pay | Admitting: Family Medicine

## 2018-07-08 ENCOUNTER — Other Ambulatory Visit: Payer: Self-pay | Admitting: Family Medicine

## 2018-07-08 NOTE — Telephone Encounter (Signed)
Pt needs refill on hydrochlorothiazide to [facility].

## 2018-07-12 MED ORDER — HYDROCHLOROTHIAZIDE 12.5 MG PO CAPS: 12.5000 mg | ORAL_CAPSULE | Freq: Every day | ORAL | 0 refills | Status: DC

## 2018-07-12 NOTE — Telephone Encounter (Signed)
Prescription sent to pharmacy.

## 2018-08-08 ENCOUNTER — Other Ambulatory Visit: Payer: Self-pay | Admitting: Family Medicine

## 2018-10-14 ENCOUNTER — Other Ambulatory Visit: Payer: Self-pay | Admitting: Family Medicine

## 2018-10-17 ENCOUNTER — Ambulatory Visit: Payer: 59 | Admitting: Adult Health

## 2018-10-17 ENCOUNTER — Encounter: Payer: Self-pay | Admitting: Adult Health

## 2018-10-17 ENCOUNTER — Other Ambulatory Visit (HOSPITAL_COMMUNITY)
Admission: RE | Admit: 2018-10-17 | Discharge: 2018-10-17 | Disposition: A | Discharge status: Home / Self Care | Payer: 59 | Source: Ambulatory Visit | Attending: Adult Health | Admitting: Adult Health

## 2018-10-17 VITALS — BP 128/81 | HR 84 | Ht 65.2 in | Wt 217.4 lb

## 2018-10-17 DIAGNOSIS — Z1212 Encounter for screening for malignant neoplasm of rectum: Secondary | ICD-10-CM

## 2018-10-17 DIAGNOSIS — Z01419 Encounter for gynecological examination (general) (routine) without abnormal findings: Secondary | ICD-10-CM | POA: Insufficient documentation

## 2018-10-17 DIAGNOSIS — Z1211 Encounter for screening for malignant neoplasm of colon: Secondary | ICD-10-CM | POA: Diagnosis not present

## 2018-10-17 LAB — HEMOCCULT GUIAC POC 1CARD (OFFICE): Fecal Occult Blood, POC: NEGATIVE

## 2018-10-17 NOTE — Addendum Note (Signed)
Addended by: Federico Flake A on: 10/17/2018 11:33 AM   Modules accepted: Orders

## 2018-10-17 NOTE — Progress Notes (Signed)
Patient ID: Ariel [name], female   DOB: 11/19/1970, 48 y.o.   MRN: [id number] History of Present Illness: Ariel [name] is a 48 year old black female, G2P2, single in for well woman gyn exam and pap. Last pap was 2015, she has been with out insurance.  PCP is Dr Jeanice Limurham.    Current Medications, Allergies, Past Medical History, Past Surgical History, Family History and Social History were reviewed in [facility] electronic medical record.     Review of Systems: Patient denies any headaches, hearing loss, fatigue, blurred vision, shortness of breath, chest pain, abdominal pain, problems with bowel movements, urination, or intercourse(not active). No joint pain or mood swings. Has vaginal discharge at times, last period September 2019.     Physical Exam:BP 128/81 (BP Location: Left Arm, Patient Position: Sitting, Cuff Size: Large)   Pulse 84   Ht 5' 5.2" (1.656 m)   Wt 217 lb 6.4 oz (98.6 kg)   LMP 06/27/2018   BMI 35.96 kg/m  General:  Well developed, well nourished, no acute distress Skin:  Warm and dry Neck:  Midline trachea, normal thyroid, good ROM, no lymphadenopathy Lungs; Clear to auscultation bilaterally Breast:  No dominant palpable mass, retraction, or nipple discharge Cardiovascular: Regular rate and rhythm Abdomen:  Soft, non tender, no hepatosplenomegaly,has scars from sleeve surgery in 2017, has gained some back, but is happy  Pelvic:  External genitalia is normal in appearance, no lesions.  The vagina is normal in appearance. Urethra has no lesions or masses. The cervix is bulbous.Pap with HPV performed.  Uterus is felt to be normal size, shape, and contour.  No adnexal masses or tenderness noted.Bladder is non tender, no masses felt. Rectal: Good sphincter tone, no polyps, or hemorrhoids felt.  Hemoccult negative. Extremities/musculoskeletal:  No swelling or varicosities noted, no clubbing or cyanosis Psych:  No mood changes, alert and cooperative,seems happy PHQ 2 score  0. Fall risk is low. Examination chaperoned by Federico FlakePeggy Dones CMA. Discussed menopause and that periods my be irregular but if it has been 366 days without one and bleeds let me know will get US.  Impression: 1. Encounter for gynecological examination with Papanicolaou smear of cervix   2. Screening for colorectal cancer       Plan: Physical in 1 year Pap in 3 if normal Get mammogram Labs with PCP Colonoscopy at 49.

## 2018-10-18 LAB — CYTOLOGY - PAP
DIAGNOSIS: NEGATIVE
HPV: NOT DETECTED

## 2018-12-22 ENCOUNTER — Other Ambulatory Visit: Payer: Self-pay | Admitting: Family Medicine

## 2019-02-06 ENCOUNTER — Other Ambulatory Visit: Payer: Self-pay | Admitting: Family Medicine

## 2019-03-23 ENCOUNTER — Other Ambulatory Visit: Payer: Self-pay | Admitting: Family Medicine

## 2019-04-27 ENCOUNTER — Other Ambulatory Visit: Payer: Self-pay | Admitting: Family Medicine

## 2019-05-10 ENCOUNTER — Encounter: Payer: Self-pay | Admitting: Family Medicine

## 2019-05-10 ENCOUNTER — Ambulatory Visit: Payer: 59 | Admitting: Family Medicine

## 2019-05-10 VITALS — BP 130/76 | HR 78 | Temp 98.4°F | Resp 14 | Ht 65.0 in | Wt 220.0 lb

## 2019-05-10 DIAGNOSIS — I10 Essential (primary) hypertension: Secondary | ICD-10-CM

## 2019-05-10 DIAGNOSIS — E119 Type 2 diabetes mellitus without complications: Secondary | ICD-10-CM

## 2019-05-10 DIAGNOSIS — R0981 Nasal congestion: Secondary | ICD-10-CM

## 2019-05-10 DIAGNOSIS — Z9889 Other specified postprocedural states: Secondary | ICD-10-CM

## 2019-05-10 DIAGNOSIS — K219 Gastro-esophageal reflux disease without esophagitis: Secondary | ICD-10-CM

## 2019-05-10 DIAGNOSIS — E669 Obesity, unspecified: Secondary | ICD-10-CM

## 2019-05-10 DIAGNOSIS — Z1239 Encounter for other screening for malignant neoplasm of breast: Secondary | ICD-10-CM

## 2019-05-10 DIAGNOSIS — R430 Anosmia: Secondary | ICD-10-CM

## 2019-05-10 MED ORDER — HYDROCHLOROTHIAZIDE 12.5 MG PO CAPS: 12.5000 mg | ORAL_CAPSULE | Freq: Every day | ORAL | 2 refills | Status: DC

## 2019-05-10 NOTE — Patient Instructions (Addendum)
F/U 6 months for Physical  Referral to ENT  Schedule your mammogram  We will call with lab results

## 2019-05-10 NOTE — Assessment & Plan Note (Signed)
Continue Nexium as needed

## 2019-05-10 NOTE — Assessment & Plan Note (Signed)
With increasing weight and going to check her A1c again.

## 2019-05-10 NOTE — Assessment & Plan Note (Signed)
Blood pressure is controlled.  She does have history of gastric bypass he has been slowly gaining some weight back.  I am going to check her vitamin levels discussed dietary changes

## 2019-05-10 NOTE — Progress Notes (Signed)
Subjective:    Patient ID: Ariel [name], female    DOB: 01/20/1971, 48 y.o.   MRN: [id number]  Patient presents for Follow-up (is not fasting) Patient here to follow-up medications and for labs.  She was last seen by me in March 2019 did however see my PA back in September 2019 after having anxiety attack that time she was given alprazolam, she only took 1.She has not had any other attacks like that since then  Has not been able to smell properly for 2 years, often gets nose, uses nasal saline, AND FLONASE  at night feels anxiious when nose clogs up    Hypertension she has been taking hydrochlorothiazide 12.5 mg daily without any difficulty. Takes BP at home   Acid reflux Nexium as needed  He does have history of diabetes mellitus which was diet controlled after weight loss.  Her last A1c was in August 2018 was 5.2% she is overdue for cholesterol panel Does follow with her GYN Pap smear is up-to-date her last mammogram was in November 2018  Obesity her weight is up 5 pounds since last September.  She has history of weight loss surgery-gastric  sleeve done in 2017.  He is taking her B12, MVI, calcium and D Walking daily for exercise   She does have plantar fasicitis but doesn't want injections, has done conservative therapy.  He asked about not having to wear her steel toed boots at work she works in a warehouse I have recommended that she do wear the boots.   Review Of Systems:  GEN- denies fatigue, fever, weight loss,weakness, recent illness HEENT- denies eye drainage, change in vision, nasal discharge, CVS- denies chest pain, palpitations RESP- denies SOB, cough, wheeze ABD- denies N/V, change in stools, abd pain GU- denies dysuria, hematuria, dribbling, incontinence MSK- + joint pain, muscle aches, injury Neuro- denies headache, dizziness, syncope, seizure activity       Objective:    BP 130/76   Pulse 78   Temp 98.4 F (36.9 C) (Oral)   Resp 14   Ht 5\' 5"  (1.651 m)    Wt 220 lb (99.8 kg)   LMP 03/19/2019   SpO2 97%   BMI 36.61 kg/m  GEN- NAD, alert and oriented x3 HEENT- PERRL, EOMI, non injected sclera, pink conjunctiva, MMM, oropharynx clear Neck- Supple, no thyromegaly CVS- RRR, no murmur RESP-CTAB ABD-NABS,soft,NT,ND EXT- No edema Pulses- Radial, DP- 2+        Assessment & Plan:      Problem List Items Addressed This Visit      Unprioritized   Diabetes mellitus (HCC)    With increasing weight and going to check her A1c again.      Relevant Orders   Hemoglobin A1c   Essential hypertension, benign - Primary    Blood pressure is controlled.  She does have history of gastric bypass he has been slowly gaining some weight back.  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  She has had this for greater than 2 years.  We have tried allergy treatment   Relevant Orders   Ambulatory referral to ENT   Nasal congestion       Relevant Orders   Ambulatory referral to ENT   Breast cancer screening       Relevant Orders   MM 3D SCREEN BREAST BILATERAL      Note: This dictation was prepared with Dragon dictation along with smaller phrase technology. Any transcriptional errors that result from this process are unintentional.

## 2019-05-12 ENCOUNTER — Encounter: Payer: Self-pay | Admitting: *Deleted

## 2019-05-12 LAB — CBC WITH DIFFERENTIAL/PLATELET
Absolute Monocytes: 634 cells/uL (ref 200–950)
Basophils Absolute: 29 cells/uL (ref 0–200)
Basophils Relative: 0.6 %
Eosinophils Absolute: 130 cells/uL (ref 15–500)
Eosinophils Relative: 2.7 %
HCT: 36.3 % (ref 35.0–45.0)
Hemoglobin: 11.9 g/dL (ref 11.7–15.5)
Lymphs Abs: 1709 cells/uL (ref 850–3900)
MCH: 27 pg (ref 27.0–33.0)
MCHC: 32.8 g/dL (ref 32.0–36.0)
MCV: 82.3 fL (ref 80.0–100.0)
MPV: 11 fL (ref 7.5–12.5)
Monocytes Relative: 13.2 %
Neutro Abs: 2299 cells/uL (ref 1500–7800)
Neutrophils Relative %: 47.9 %
Platelets: 303 10*3/uL (ref 140–400)
RBC: 4.41 10*6/uL (ref 3.80–5.10)
RDW: 12.7 % (ref 11.0–15.0)
Total Lymphocyte: 35.6 %
WBC: 4.8 10*3/uL (ref 3.8–10.8)

## 2019-05-12 LAB — LIPID PANEL
Cholesterol: 167 mg/dL (ref <200)
HDL: 43 mg/dL — ABNORMAL LOW (ref >=50)
LDL Cholesterol (Calc): 98 mg/dL (calc)
Non-HDL Cholesterol (Calc): 124 mg/dL (calc) (ref <130)
Total CHOL/HDL Ratio: 3.9 (calc) (ref <5.0)
Triglycerides: 164 mg/dL — ABNORMAL HIGH (ref <150)

## 2019-05-12 LAB — COMPREHENSIVE METABOLIC PANEL
AG Ratio: 1.3 (calc) (ref 1.0–2.5)
ALT: 11 U/L (ref 6–29)
AST: 16 U/L (ref 10–35)
Albumin: 3.8 g/dL (ref 3.6–5.1)
Alkaline phosphatase (APISO): 61 U/L (ref 31–125)
BUN: 10 mg/dL (ref 7–25)
CO2: 28 mmol/L (ref 20–32)
Calcium: 9.2 mg/dL (ref 8.6–10.2)
Chloride: 102 mmol/L (ref 98–110)
Creat: 0.71 mg/dL (ref 0.50–1.10)
Globulin: 3 g/dL (calc) (ref 1.9–3.7)
Glucose, Bld: 97 mg/dL (ref 65–99)
Potassium: 3.5 mmol/L (ref 3.5–5.3)
Sodium: 137 mmol/L (ref 135–146)
Total Bilirubin: 0.4 mg/dL (ref 0.2–1.2)
Total Protein: 6.8 g/dL (ref 6.1–8.1)

## 2019-05-12 LAB — VITAMIN B12: Vitamin B-12: >2000 pg/mL — ABNORMAL HIGH (ref 200–1100)

## 2019-05-12 LAB — HEMOGLOBIN A1C
Hgb A1c MFr Bld: 5.8 % of total Hgb — ABNORMAL HIGH (ref <5.7)
Mean Plasma Glucose: 120 (calc)
eAG (mmol/L): 6.6 (calc)

## 2019-05-12 LAB — VITAMIN D 25 HYDROXY (VIT D DEFICIENCY, FRACTURES): Vit D, 25-Hydroxy: 30 ng/mL (ref 30–100)

## 2019-06-22 ENCOUNTER — Ambulatory Visit (INDEPENDENT_AMBULATORY_CARE_PROVIDER_SITE_OTHER): Payer: 59 | Admitting: Otolaryngology

## 2019-06-22 DIAGNOSIS — R430 Anosmia: Secondary | ICD-10-CM

## 2019-06-22 DIAGNOSIS — J343 Hypertrophy of nasal turbinates: Secondary | ICD-10-CM

## 2019-06-22 DIAGNOSIS — J310 Chronic rhinitis: Secondary | ICD-10-CM | POA: Diagnosis not present

## 2019-07-17 ENCOUNTER — Telehealth: Payer: Self-pay | Admitting: Family Medicine

## 2019-07-17 MED ORDER — HYDROCHLOROTHIAZIDE 12.5 MG PO CAPS: 12.5000 mg | ORAL_CAPSULE | Freq: Every day | ORAL | 2 refills | Status: DC

## 2019-07-17 NOTE — Telephone Encounter (Signed)
Prescription sent to pharmacy.

## 2019-07-17 NOTE — Telephone Encounter (Signed)
Patient called in states she has lost her hydrochlorothiazide she would like it called in to [place].  CB# [phone]

## 2019-07-24 ENCOUNTER — Ambulatory Visit (INDEPENDENT_AMBULATORY_CARE_PROVIDER_SITE_OTHER): Payer: 59 | Admitting: Otolaryngology

## 2019-07-24 ENCOUNTER — Other Ambulatory Visit: Payer: Self-pay

## 2019-07-24 DIAGNOSIS — J343 Hypertrophy of nasal turbinates: Secondary | ICD-10-CM | POA: Diagnosis not present

## 2019-07-24 DIAGNOSIS — J310 Chronic rhinitis: Secondary | ICD-10-CM

## 2019-08-08 ENCOUNTER — Other Ambulatory Visit: Payer: Self-pay

## 2019-08-08 DIAGNOSIS — Z20822 Contact with and (suspected) exposure to covid-19: Secondary | ICD-10-CM

## 2019-08-10 LAB — NOVEL CORONAVIRUS, NAA

## 2019-08-14 ENCOUNTER — Telehealth: Payer: Self-pay

## 2019-08-14 NOTE — Telephone Encounter (Signed)
Phone call to pt. To discuss recent COVID test on 08/08/19.  Left vm to return call to [phone], to further discuss recommendation for COVID test.  When pt. returns call, she will need to be made aware that the COVID specimen submitted, was not sufficient for analysis, and she would need to retest.

## 2019-08-15 NOTE — Telephone Encounter (Signed)
Patient called in and was made aware of previous note. Advised patient to go get retested. Pt stated she will attempt to go either today or tomorrow.

## 2019-08-16 ENCOUNTER — Other Ambulatory Visit: Payer: Self-pay

## 2019-08-16 DIAGNOSIS — Z20822 Contact with and (suspected) exposure to covid-19: Secondary | ICD-10-CM

## 2019-08-17 LAB — NOVEL CORONAVIRUS, NAA: SARS-CoV-2, NAA: NOT DETECTED

## 2019-08-18 ENCOUNTER — Telehealth: Payer: Self-pay | Admitting: Family Medicine

## 2019-08-18 NOTE — Telephone Encounter (Signed)
° °  Pt rwc neg COVID results

## 2019-09-21 ENCOUNTER — Ambulatory Visit (INDEPENDENT_AMBULATORY_CARE_PROVIDER_SITE_OTHER): Payer: 59 | Admitting: Otolaryngology

## 2019-10-02 ENCOUNTER — Ambulatory Visit: Payer: 59 | Attending: Internal Medicine

## 2019-10-02 ENCOUNTER — Other Ambulatory Visit: Payer: Self-pay

## 2019-10-02 DIAGNOSIS — Z20822 Contact with and (suspected) exposure to covid-19: Secondary | ICD-10-CM

## 2019-10-03 LAB — NOVEL CORONAVIRUS, NAA: SARS-CoV-2, NAA: NOT DETECTED

## 2019-11-13 ENCOUNTER — Encounter: Payer: 59 | Admitting: Family Medicine

## 2020-02-20 ENCOUNTER — Other Ambulatory Visit (HOSPITAL_COMMUNITY): Payer: Self-pay | Admitting: Family Medicine

## 2020-02-20 DIAGNOSIS — Z1231 Encounter for screening mammogram for malignant neoplasm of breast: Secondary | ICD-10-CM

## 2020-02-22 ENCOUNTER — Ambulatory Visit (HOSPITAL_COMMUNITY)
Admission: RE | Admit: 2020-02-22 | Discharge: 2020-02-22 | Disposition: A | Discharge status: Home / Self Care | Payer: 59 | Source: Ambulatory Visit | Attending: Family Medicine | Admitting: Family Medicine

## 2020-02-22 ENCOUNTER — Other Ambulatory Visit: Payer: Self-pay

## 2020-02-22 DIAGNOSIS — Z1231 Encounter for screening mammogram for malignant neoplasm of breast: Secondary | ICD-10-CM | POA: Insufficient documentation

## 2020-03-14 ENCOUNTER — Encounter: Payer: Self-pay | Admitting: Adult Health

## 2020-03-14 ENCOUNTER — Ambulatory Visit (INDEPENDENT_AMBULATORY_CARE_PROVIDER_SITE_OTHER): Payer: 59 | Admitting: Adult Health

## 2020-03-14 VITALS — BP 137/88 | HR 86 | Ht 65.0 in | Wt 228.0 lb

## 2020-03-14 DIAGNOSIS — Z1212 Encounter for screening for malignant neoplasm of rectum: Secondary | ICD-10-CM | POA: Diagnosis not present

## 2020-03-14 DIAGNOSIS — Z1211 Encounter for screening for malignant neoplasm of colon: Secondary | ICD-10-CM | POA: Diagnosis not present

## 2020-03-14 DIAGNOSIS — Z6838 Body mass index (BMI) 38.0-38.9, adult: Secondary | ICD-10-CM | POA: Insufficient documentation

## 2020-03-14 DIAGNOSIS — Z6837 Body mass index (BMI) 37.0-37.9, adult: Secondary | ICD-10-CM

## 2020-03-14 DIAGNOSIS — Z01419 Encounter for gynecological examination (general) (routine) without abnormal findings: Secondary | ICD-10-CM | POA: Diagnosis not present

## 2020-03-14 DIAGNOSIS — Z713 Dietary counseling and surveillance: Secondary | ICD-10-CM | POA: Diagnosis not present

## 2020-03-14 DIAGNOSIS — Z1272 Encounter for screening for malignant neoplasm of vagina: Secondary | ICD-10-CM

## 2020-03-14 DIAGNOSIS — N951 Menopausal and female climacteric states: Secondary | ICD-10-CM

## 2020-03-14 LAB — HEMOCCULT GUIAC POC 1CARD (OFFICE): Fecal Occult Blood, POC: NEGATIVE

## 2020-03-14 MED ORDER — PHENTERMINE HCL 37.5 MG PO TABS: 37.5000 mg | ORAL_TABLET | Freq: Every day | ORAL | 0 refills | Status: DC

## 2020-03-14 NOTE — Progress Notes (Signed)
Patient ID: Ariel [name], female   DOB: May 25, 1971, 49 y.o.   MRN: [id number] History of Present Illness: Ariel [name] is a 49 year old black female, single, G2P2 in for well woman gyn exam, she had a normal pap with negative HPV 10/17/18.She has had a tubal. PCP is Dr Jeanice Lim.   Current Medications, Allergies, Past Medical History, Past Surgical History, Family History and Social History were reviewed in [facility] electronic medical record.     Review of Systems:  Patient denies any headaches, hearing loss, fatigue, blurred vision, shortness of breath, chest pain, abdominal pain, problems with bowel movements, urination, or intercourse. No joint pain or mood swings. Has skipped some periods +hot flashes Working out but not losing any weight, she wants to try adipex, has use in the past  Will wake up in middle of night   Physical Exam:BP 137/88 (BP Location: Left Arm, Patient Position: Sitting, Cuff Size: Large)   Pulse 86   Ht 5\' 5"  (1.651 m)   Wt 228 lb (103.4 kg)   LMP 03/04/2020   BMI 37.94 kg/m  General:  Well developed, well nourished, no acute distress Skin:  Warm and dry Neck:  Midline trachea, normal thyroid, good ROM, no lymphadenopathy Lungs; Clear to auscultation bilaterally Breast:  No dominant palpable mass, retraction, or nipple discharge Cardiovascular: Regular rate and rhythm Abdomen:  Soft, non tender, no hepatosplenomegaly Pelvic:  External genitalia is normal in appearance, no lesions.  The vagina is normal in appearance. Urethra has no lesions or masses. The cervix is bulbous.  Uterus is felt to be normal size, shape, and contour.  No adnexal masses or tenderness noted.Bladder is non tender, no masses felt. Rectal: Good sphincter tone, no polyps, or hemorrhoids felt.  Hemoccult negative. Extremities/musculoskeletal:  No swelling or varicosities noted, no clubbing or cyanosis Psych:  No mood changes, alert and cooperative,seems happy AA 1 Fall risk is low PHQ 9  score is 3, no SI, says is not depressed Examination chaperoned by 03/06/2020 CMA.  Impression and Plan: 1. Encounter for well woman exam with routine gynecological exam Physical in 1 year Pap in 2023 Labs with PCP Mammogram yearly  2. Screening for colorectal cancer Colonoscopy is due  3. Peri-menopausal  4. Menopausal symptoms Discussed menopause with Anginette, not ready for HRT or SSRI  5. Weight loss counseling, encounter for Decease carbs Increase water Will rx adipex Meds ordered this encounter  Medications  . phentermine (ADIPEX-P) 37.5 MG tablet    Sig: Take 1 tablet (37.5 mg total) by mouth daily before breakfast.    Dispense:  30 tablet    Refill:  0    Order Specific Question:   Supervising Provider    Answer:   Deuel Sink H [[id number]]  Follow up in 4 weeks for weight and BP check   6. Body mass index 37.0-37.9, adult

## 2020-05-07 ENCOUNTER — Encounter: Payer: Self-pay | Admitting: Podiatry

## 2020-05-07 ENCOUNTER — Other Ambulatory Visit: Payer: Self-pay

## 2020-05-07 ENCOUNTER — Other Ambulatory Visit: Payer: Self-pay | Admitting: Podiatry

## 2020-05-07 ENCOUNTER — Ambulatory Visit (INDEPENDENT_AMBULATORY_CARE_PROVIDER_SITE_OTHER): Payer: 59

## 2020-05-07 ENCOUNTER — Ambulatory Visit: Payer: 59 | Admitting: Podiatry

## 2020-05-07 DIAGNOSIS — M722 Plantar fascial fibromatosis: Secondary | ICD-10-CM | POA: Diagnosis not present

## 2020-05-07 DIAGNOSIS — M779 Enthesopathy, unspecified: Secondary | ICD-10-CM

## 2020-05-07 NOTE — Progress Notes (Signed)
Subjective:  Patient ID: Ariel [name], female    DOB: Nov 21, 1970,  MRN: [id number]  Chief Complaint  Patient presents with  . Foot Pain    Patient presents today for left heel/arch pain x 5-6 years off and on.  She says its more painful at night and its like a dull ache   She has been stretching, icing and taking Ibuprofen for relief    49 y.o. female presents with the above complaint.  Patient presents with left heel pain that has been going on for 5 to 6 years progressive gotten worse.  Patient is currently still toe boots seems to make it worse.  She states that he she had a history of plantar fasciitis but it can went well with sugar modification.  She has tried mostly over-the-counter therapy for it.  She has not seen anyone else prior to seeing me.  She denies any other acute complaints.   Review of Systems: Negative except as noted in the HPI. Denies N/V/F/Ch.  Past Medical History:  Diagnosis Date  . Boil of buttock 09/12/2015   comes and goes- not at present  . BV (bacterial vaginosis) 08/09/2013   03-05-16 no problems now  . Diabetes mellitus without complication (HCC)    borderline  . Hypertension   . Obesity   . Panic attack 07/2015   wakes up from sleep with panic attack  . Transfusion history    25 yrs ago after childbirth -NVD  . Vaginal discharge 04/15/2016  . Vaginal itching 08/09/2013   no problem now  . Yeast infection 04/15/2016    Current Outpatient Medications:  .  acetaminophen (TYLENOL) 325 MG tablet, Take 650 mg by mouth every 6 (six) hours as needed (For pain.)., Disp: , Rfl:  .  Calcium Carbonate-Vitamin D (CALCIUM 600/VITAMIN D) 600-400 MG-UNIT chew tablet, Chew 2 tablets by mouth daily., Disp: 60 tablet, Rfl: 0 .  Cyanocobalamin (VITAMIN B-12) 500 MCG SUBL, Place 1 tablet (500 mcg total) under the tongue daily., Disp: 150 tablet, Rfl:  .  esomeprazole (NEXIUM) 20 MG capsule, Take 20 mg by mouth daily as needed., Disp: , Rfl:  .  fluticasone (FLONASE)  50 MCG/ACT nasal spray, INSTILL 2 SPRAYS INTO EACH NOSTRIL DAILY., Disp: 16 g, Rfl: 0 .  hydrochlorothiazide (MICROZIDE) 12.5 MG capsule, Take 1 capsule (12.5 mg total) by mouth daily., Disp: 90 capsule, Rfl: 2 .  Multiple Vitamin (MULTIVITAMIN WITH MINERALS) TABS tablet, Take 1 tablet by mouth daily., Disp: , Rfl:  .  phentermine (ADIPEX-P) 37.5 MG tablet, Take 1 tablet (37.5 mg total) by mouth daily before breakfast., Disp: 30 tablet, Rfl: 0  Social History   Tobacco Use  Smoking Status Never Smoker  Smokeless Tobacco Never Used    No Known Allergies Objective:  There were no vitals filed for this visit. There is no height or weight on file to calculate BMI. Constitutional Well developed. Well nourished.  Vascular Dorsalis pedis pulses palpable bilaterally. Posterior tibial pulses palpable bilaterally. Capillary refill normal to all digits.  No cyanosis or clubbing noted. Pedal hair growth normal.  Neurologic Normal speech. Oriented to person, place, and time. Epicritic sensation to light touch grossly present bilaterally.  Dermatologic Nails well groomed and normal in appearance. No open wounds. No skin lesions.  Orthopedic: Normal joint ROM without pain or crepitus bilaterally. No visible deformities. Tender to palpation at the calcaneal tuber left. No pain with calcaneal squeeze left. Ankle ROM diminished range of motion left. Silfverskiold Test: positive left.  Radiographs: Taken and reviewed. No acute fractures or dislocations. No evidence of stress fracture.  Plantar heel spur present. Posterior heel spur present.   Assessment:   1. Plantar fasciitis of left foot    Plan:  Patient was evaluated and treated and all questions answered.  Plantar Fasciitis, left - XR reviewed as above.  - Educated on icing and stretching. Instructions given.  - Injection delivered to the plantar fascia as below. - DME: Plantar Fascial Brace x1 - Pharmacologic management:  Meloxicam/Medrol Dose Pak. Educated on risks/benefits and proper taking of medication.  Procedure: Injection Tendon/Ligament Location: Left plantar fascia at the glabrous junction; medial approach. Skin Prep: alcohol Injectate: 0.5 cc 0.5% marcaine plain, 0.5 cc of 1% Lidocaine, 0.5 cc kenalog 10. Disposition: Patient tolerated procedure well. Injection site dressed with a band-aid.  No follow-ups on file.

## 2020-05-08 ENCOUNTER — Encounter: Payer: 59 | Admitting: Nurse Practitioner

## 2020-06-11 ENCOUNTER — Encounter: Payer: Self-pay | Admitting: Podiatry

## 2020-06-11 ENCOUNTER — Ambulatory Visit: Payer: 59 | Admitting: Podiatry

## 2020-06-11 ENCOUNTER — Other Ambulatory Visit: Payer: Self-pay

## 2020-06-11 DIAGNOSIS — M722 Plantar fascial fibromatosis: Secondary | ICD-10-CM

## 2020-06-11 DIAGNOSIS — Q666 Other congenital valgus deformities of feet: Secondary | ICD-10-CM | POA: Diagnosis not present

## 2020-06-12 ENCOUNTER — Encounter: Payer: Self-pay | Admitting: Podiatry

## 2020-06-12 NOTE — Progress Notes (Signed)
Subjective:  Patient ID: Ariel [name], female    DOB: 04/28/1971,  MRN: [id number]  Chief Complaint  Patient presents with   Plantar Fasciitis    "its alot better"    49 y.o. female presents with the above complaint.  Patient presents with a follow-up of left plantar fasciitis.  Patient states that she is about 80% improved.  She still has a little bit of dull achy pain to it.  She states overall she has done a lot better.  The injection helped the bracing helped.  She has made some shoe gear modification as well.  She denies any other acute complaints.  She would like to discuss rest of the treatment plan for this..   Review of Systems: Negative except as noted in the HPI. Denies N/V/F/Ch.  Past Medical History:  Diagnosis Date   Boil of buttock 09/12/2015   comes and goes- not at present   BV (bacterial vaginosis) 08/09/2013   03-05-16 no problems now   Diabetes mellitus without complication (HCC)    borderline   Hypertension    Obesity    Panic attack 07/2015   wakes up from sleep with panic attack   Transfusion history    25 yrs ago after childbirth -NVD   Vaginal discharge 04/15/2016   Vaginal itching 08/09/2013   no problem now   Yeast infection 04/15/2016    Current Outpatient Medications:    acetaminophen (TYLENOL) 325 MG tablet, Take 650 mg by mouth every 6 (six) hours as needed (For pain.)., Disp: , Rfl:    Calcium Carbonate-Vitamin D (CALCIUM 600/VITAMIN D) 600-400 MG-UNIT chew tablet, Chew 2 tablets by mouth daily., Disp: 60 tablet, Rfl: 0   Cyanocobalamin (VITAMIN B-12) 500 MCG SUBL, Place 1 tablet (500 mcg total) under the tongue daily., Disp: 150 tablet, Rfl:    esomeprazole (NEXIUM) 20 MG capsule, Take 20 mg by mouth daily as needed., Disp: , Rfl:    fluticasone (FLONASE) 50 MCG/ACT nasal spray, INSTILL 2 SPRAYS INTO EACH NOSTRIL DAILY., Disp: 16 g, Rfl: 0   hydrochlorothiazide (MICROZIDE) 12.5 MG capsule, Take 1 capsule (12.5 mg total) by mouth  daily., Disp: 90 capsule, Rfl: 2   Multiple Vitamin (MULTIVITAMIN WITH MINERALS) TABS tablet, Take 1 tablet by mouth daily., Disp: , Rfl:    phentermine (ADIPEX-P) 37.5 MG tablet, Take 1 tablet (37.5 mg total) by mouth daily before breakfast., Disp: 30 tablet, Rfl: 0  Social History   Tobacco Use  Smoking Status Never Smoker  Smokeless Tobacco Never Used    No Known Allergies Objective:  There were no vitals filed for this visit. There is no height or weight on file to calculate BMI. Constitutional Well developed. Well nourished.  Vascular Dorsalis pedis pulses palpable bilaterally. Posterior tibial pulses palpable bilaterally. Capillary refill normal to all digits.  No cyanosis or clubbing noted. Pedal hair growth normal.  Neurologic Normal speech. Oriented to person, place, and time. Epicritic sensation to light touch grossly present bilaterally.  Dermatologic Nails well groomed and normal in appearance. No open wounds. No skin lesions.  Orthopedic: Normal joint ROM without pain or crepitus bilaterally. No visible deformities. Mild tender to palpation at the calcaneal tuber left. No pain with calcaneal squeeze left. Ankle ROM diminished range of motion left. Silfverskiold Test: positive left.   Radiographs: Taken and reviewed. No acute fractures or dislocations. No evidence of stress fracture.  Plantar heel spur present. Posterior heel spur present.   Assessment:   1. Plantar fasciitis of left  foot   2. Pes planovalgus    Plan:  Patient was evaluated and treated and all questions answered.  Plantar Fasciitis, left - XR reviewed as above.  - Educated on icing and stretching. Instructions given.  - Injection delivered to the plantar fascia as below. - DME: Night splint - Pharmacologic management: None  Semiflexible pes planovalgus -I explained patient the etiology of pes planovalgus and various treatment options were extensively discussed.  Given the  architecture of the foot is likely the driving force of the plantar fasciitis.  Ultimately I discussed with the patient that unless have been addressed/reduced the stress on the plantar fascia there is a high chance of recurrence associated with this.  Patient agrees with the plan would like to obtain custom-made orthotics to support the foot as well as control the hindfoot motion. -Should be scheduled see rec for custom-made orthotics  Procedure: Injection Tendon/Ligament Location: Left plantar fascia at the glabrous junction; medial approach. Skin Prep: alcohol Injectate: 0.5 cc 0.5% marcaine plain, 0.5 cc of 1% Lidocaine, 0.5 cc kenalog 10. Disposition: Patient tolerated procedure well. Injection site dressed with a band-aid.  No follow-ups on file.

## 2020-07-04 ENCOUNTER — Other Ambulatory Visit: Payer: Self-pay

## 2020-07-04 ENCOUNTER — Ambulatory Visit
Admission: EM | Admit: 2020-07-04 | Discharge: 2020-07-04 | Disposition: A | Discharge status: Home / Self Care | Payer: 59 | Attending: Emergency Medicine | Admitting: Emergency Medicine

## 2020-07-04 ENCOUNTER — Ambulatory Visit: Payer: Self-pay

## 2020-07-04 DIAGNOSIS — J069 Acute upper respiratory infection, unspecified: Secondary | ICD-10-CM

## 2020-07-04 DIAGNOSIS — Z20822 Contact with and (suspected) exposure to covid-19: Secondary | ICD-10-CM

## 2020-07-04 DIAGNOSIS — Z1152 Encounter for screening for COVID-19: Secondary | ICD-10-CM

## 2020-07-04 MED ORDER — BENZONATATE 100 MG PO CAPS: 100.0000 mg | ORAL_CAPSULE | Freq: Three times a day (TID) | ORAL | 0 refills | Status: DC

## 2020-07-04 NOTE — ED Provider Notes (Signed)
[facility]   [id number] 07/04/20 Arrival Time: 1202   CC: COVID symptoms  SUBJECTIVE: History from: patient.  Ariel [name] is a 49 y.o. female who presents with nasal congestion and non productive cough x 1 week.  Denies sick exposure to COVID, flu or strep.  Has tried OTC medications with relief.  Denies aggravating factors.  Denies previous symptoms in the past.   Denies previous COVID infection in the past.  Received covid vaccines.  Denies fever,  sore throat, SOB, wheezing, chest pain, nausea, changes in bowel or bladder habits.    ROS: As per HPI.  All other pertinent ROS negative.     Past Medical History:  Diagnosis Date  . Boil of buttock 09/12/2015   comes and goes- not at present  . BV (bacterial vaginosis) 08/09/2013   03-05-16 no problems now  . Diabetes mellitus without complication (HCC)    borderline  . Hypertension   . Obesity   . Panic attack 07/2015   wakes up from sleep with panic attack  . Transfusion history    25 yrs ago after childbirth -NVD  . Vaginal discharge 04/15/2016  . Vaginal itching 08/09/2013   no problem now  . Yeast infection 04/15/2016   Past Surgical History:  Procedure Laterality Date  . LAPAROSCOPIC GASTRIC SLEEVE RESECTION N/A 03/17/2016   Procedure: LAPAROSCOPIC GASTRIC SLEEVE RESECTION WITH UPPER ENDOSCOPY;  Surgeon: Ovidio Kin, MD;  Location: [facility];  Service: General;  Laterality: N/A;  . TUBAL LIGATION    . UPPER GI ENDOSCOPY  03/17/2016   Procedure: UPPER GI ENDOSCOPY;  Surgeon: Ovidio Kin, MD;  Location: [facility];  Service: General;;  . WISDOM TOOTH EXTRACTION     No Known Allergies No current facility-administered medications on file prior to encounter.   Current Outpatient Medications on File Prior to Encounter  Medication Sig Dispense Refill  . acetaminophen (TYLENOL) 325 MG tablet Take 650 mg by mouth every 6 (six) hours as needed (For pain.).    Marland Kitchen Calcium Carbonate-Vitamin D (CALCIUM 600/VITAMIN D) 600-400  MG-UNIT chew tablet Chew 2 tablets by mouth daily. 60 tablet 0  . Cyanocobalamin (VITAMIN B-12) 500 MCG SUBL Place 1 tablet (500 mcg total) under the tongue daily. 150 tablet   . esomeprazole (NEXIUM) 20 MG capsule Take 20 mg by mouth daily as needed.    . fluticasone (FLONASE) 50 MCG/ACT nasal spray INSTILL 2 SPRAYS INTO EACH NOSTRIL DAILY. 16 g 0  . hydrochlorothiazide (MICROZIDE) 12.5 MG capsule Take 1 capsule (12.5 mg total) by mouth daily. 90 capsule 2  . Multiple Vitamin (MULTIVITAMIN WITH MINERALS) TABS tablet Take 1 tablet by mouth daily.    . phentermine (ADIPEX-P) 37.5 MG tablet Take 1 tablet (37.5 mg total) by mouth daily before breakfast. 30 tablet 0   Social History   Socioeconomic History  . Marital status: Single    Spouse name: Not on file  . Number of children: 2  . Years of education: Not on file  . Highest education level: Not on file  Occupational History  . Not on file  Tobacco Use  . Smoking status: Never Smoker  . Smokeless tobacco: Never Used  Vaping Use  . Vaping Use: Never used  Substance and Sexual Activity  . Alcohol use: No    Comment: rare -occ  . Drug use: No  . Sexual activity: Yes    Birth control/protection: Surgical    Comment: tubal  Other Topics Concern  . Not on file  Social History Narrative  . Not on file   Social Determinants of Health   Financial Resource Strain: Low Risk   . Difficulty of Paying Living Expenses: Not hard at all  Food Insecurity: Unknown  . Worried About Programme researcher, broadcasting/film/video in the Last Year: Patient refused  . Ran Out of Food in the Last Year: Patient refused  Transportation Needs: No Transportation Needs  . Lack of Transportation (Medical): No  . Lack of Transportation (Non-Medical): No  Physical Activity: Sufficiently Active  . Days of Exercise per Week: 4 days  . Minutes of Exercise per Session: 50 min  Stress: Stress Concern Present  . Feeling of Stress : To some extent  Social Connections: Moderately  Integrated  . Frequency of Communication with Friends and Family: More than three times a week  . Frequency of Social Gatherings with Friends and Family: Once a week  . Attends Religious Services: More than 4 times per year  . Active Member of Clubs or Organizations: Yes  . Attends Banker Meetings: 1 to 4 times per year  . Marital Status: Never married  Intimate Partner Violence: Not At Risk  . Fear of Current or Ex-Partner: No  . Emotionally Abused: No  . Physically Abused: No  . Sexually Abused: No   Family History  Problem Relation Age of Onset  . Hypertension Mother   . Diabetes Father   . Hypertension Father     OBJECTIVE:  Vitals:   07/04/20 1244  BP: (!) 155/87  Pulse: 95  Resp: 18  Temp: 98.3 F (36.8 C)  SpO2: 97%     General appearance: alert; well-appearing, nontoxic; speaking in full sentences and tolerating own secretions HEENT: NCAT; Ears: EACs clear, TMs pearly gray; Eyes: PERRL.  EOM grossly intact.Nose: nares patent without rhinorrhea, Throat: oropharynx clear, tonsils non erythematous or enlarged, uvula midline  Neck: supple without LAD Lungs: unlabored respirations, symmetrical air entry; cough: mild; no respiratory distress; CTAB Heart: regular rate and rhythm.  Skin: warm and dry Psychological: alert and cooperative; normal mood and affect   ASSESSMENT & PLAN:  1. Encounter for screening for COVID-19   2. Viral URI with cough   3. Suspected COVID-19 virus infection     Meds ordered this encounter  Medications  . benzonatate (TESSALON) 100 MG capsule    Sig: Take 1 capsule (100 mg total) by mouth every 8 (eight) hours.    Dispense:  21 capsule    Refill:  0    Order Specific Question:   Supervising Provider    Answer:   Eustace Moore [[id number]]   COVID testing ordered.  It will take between 5-7 days for test results.  Someone will contact you regarding abnormal results.    In the meantime: You should remain isolated in  your home for 10 days from symptom onset AND greater than 72 hours after symptoms resolution (absence of fever without the use of fever-reducing medication and improvement in respiratory symptoms), whichever is longer Get plenty of rest and push fluids Tessalon Perles prescribed for cough Use OTC zyrtec for nasal congestion, runny nose, and/or sore throat Use OTC flonase for nasal congestion and runny nose Use medications daily for symptom relief Use OTC medications like ibuprofen or tylenol as needed fever or pain Call or go to the ED if you have any new or worsening symptoms such as fever, worsening cough, shortness of breath, chest tightness, chest pain, turning blue, changes in mental status, etc..Marland Kitchen  Reviewed expectations re: course of current medical issues. Questions answered. Outlined signs and symptoms indicating need for more acute intervention. Patient verbalized understanding. After Visit Summary given.         Rennis Harding, PA-C 07/04/20 1309

## 2020-07-04 NOTE — ED Triage Notes (Signed)
Pt presents with nasal congestion and cough that began over week ago, symptoms worse at night

## 2020-07-04 NOTE — Discharge Instructions (Signed)

## 2020-07-06 LAB — NOVEL CORONAVIRUS, NAA: SARS-CoV-2, NAA: DETECTED — AB

## 2020-07-06 LAB — SARS-COV-2, NAA 2 DAY TAT

## 2020-07-07 ENCOUNTER — Telehealth: Payer: Self-pay | Admitting: Physician Assistant

## 2020-07-07 NOTE — Telephone Encounter (Signed)
Called to Discuss with patient about Covid symptoms and the use of the monoclonal antibody infusion for those with mild to moderate Covid symptoms and at a high risk of hospitalization.     Pt is qualified for this infusion due to co-morbid conditions and/or a member of an at-risk group.     Patient Active Problem List   Diagnosis Date Noted  . Menopausal symptoms 03/14/2020  . Encounter for well woman exam with routine gynecological exam 03/14/2020  . Screening for colorectal cancer 03/14/2020  . Body mass index 37.0-37.9, adult 03/14/2020  . Weight loss counseling, encounter for 03/14/2020  . History of gastric surgery 05/10/2019  . Obesity (BMI 30-39.9) 06/25/2017  . Peri-menopausal 09/12/2015  . GERD (gastroesophageal reflux disease) 09/11/2015  . Heel spur 04/17/2015  . Right knee pain 01/15/2014  . Diabetes mellitus (HCC) 07/12/2013  . Essential hypertension, benign 05/30/2012    Patient declines infusion at this time. Symptoms tier reviewed as well as criteria for ending isolation. Preventative practices reviewed. Patient verbalized understanding.    Patient advised to call back if he/she decides that he/she does want to get infusion. Callback number to the infusion center given. Patient advised to go to Urgent care or ED with severe symptoms.

## 2020-07-10 ENCOUNTER — Ambulatory Visit
Admission: EM | Admit: 2020-07-10 | Discharge: 2020-07-10 | Disposition: A | Discharge status: Home / Self Care | Payer: 59 | Attending: Emergency Medicine | Admitting: Emergency Medicine

## 2020-07-10 ENCOUNTER — Other Ambulatory Visit: Payer: Self-pay

## 2020-07-10 ENCOUNTER — Other Ambulatory Visit: Payer: 59 | Admitting: Orthotics

## 2020-07-10 DIAGNOSIS — U071 COVID-19: Secondary | ICD-10-CM | POA: Diagnosis not present

## 2020-07-10 DIAGNOSIS — R42 Dizziness and giddiness: Secondary | ICD-10-CM | POA: Diagnosis not present

## 2020-07-10 DIAGNOSIS — F419 Anxiety disorder, unspecified: Secondary | ICD-10-CM | POA: Diagnosis not present

## 2020-07-10 MED ORDER — DEXAMETHASONE 4 MG PO TABS: 4.0000 mg | ORAL_TABLET | Freq: Every day | ORAL | 0 refills | Status: DC

## 2020-07-10 MED ORDER — HYDROXYZINE HCL 25 MG PO TABS: 25.0000 mg | ORAL_TABLET | Freq: Three times a day (TID) | ORAL | 0 refills | Status: DC | PRN

## 2020-07-10 NOTE — ED Triage Notes (Signed)
Pt covid positive, began having dizziness earlier, feels better now

## 2020-07-10 NOTE — ED Provider Notes (Addendum)
[facility]   [id number] 07/10/20 Arrival Time: 1713   Chief Complaint  Patient presents with  . Dizziness     SUBJECTIVE: History from: patient.  Ariel [name] is a 49 y.o. female who is Covid positive presented to the urgent care for complaint of dizziness  for the past few days.  States she was feeling like her head was heavy.  Reports symptom is better now.  Denies any precipitating event.  Denies recent travel.  Has tried OTC medication without relief.  Denies aggravating factors.  Denies previous symptoms in the past.   Denies fever, chills, fatigue, sinus pain, rhinorrhea, sore throat, SOB, wheezing, chest pain, nausea, changes in bowel or bladder habits.     ROS: As per HPI.  All other pertinent ROS negative.     Past Medical History:  Diagnosis Date  . Boil of buttock 09/12/2015   comes and goes- not at present  . BV (bacterial vaginosis) 08/09/2013   03-05-16 no problems now  . Diabetes mellitus without complication (HCC)    borderline  . Hypertension   . Obesity   . Panic attack 07/2015   wakes up from sleep with panic attack  . Transfusion history    25 yrs ago after childbirth -NVD  . Vaginal discharge 04/15/2016  . Vaginal itching 08/09/2013   no problem now  . Yeast infection 04/15/2016   Past Surgical History:  Procedure Laterality Date  . LAPAROSCOPIC GASTRIC SLEEVE RESECTION N/A 03/17/2016   Procedure: LAPAROSCOPIC GASTRIC SLEEVE RESECTION WITH UPPER ENDOSCOPY;  Surgeon: Ovidio Kin, MD;  Location: [facility];  Service: General;  Laterality: N/A;  . TUBAL LIGATION    . UPPER GI ENDOSCOPY  03/17/2016   Procedure: UPPER GI ENDOSCOPY;  Surgeon: Ovidio Kin, MD;  Location: [facility];  Service: General;;  . WISDOM TOOTH EXTRACTION     No Known Allergies No current facility-administered medications on file prior to encounter.   Current Outpatient Medications on File Prior to Encounter  Medication Sig Dispense Refill  . acetaminophen (TYLENOL) 325 MG  tablet Take 650 mg by mouth every 6 (six) hours as needed (For pain.).    Marland Kitchen benzonatate (TESSALON) 100 MG capsule Take 1 capsule (100 mg total) by mouth every 8 (eight) hours. 21 capsule 0  . Calcium Carbonate-Vitamin D (CALCIUM 600/VITAMIN D) 600-400 MG-UNIT chew tablet Chew 2 tablets by mouth daily. 60 tablet 0  . Cyanocobalamin (VITAMIN B-12) 500 MCG SUBL Place 1 tablet (500 mcg total) under the tongue daily. 150 tablet   . esomeprazole (NEXIUM) 20 MG capsule Take 20 mg by mouth daily as needed.    . fluticasone (FLONASE) 50 MCG/ACT nasal spray INSTILL 2 SPRAYS INTO EACH NOSTRIL DAILY. 16 g 0  . hydrochlorothiazide (MICROZIDE) 12.5 MG capsule Take 1 capsule (12.5 mg total) by mouth daily. 90 capsule 2  . Multiple Vitamin (MULTIVITAMIN WITH MINERALS) TABS tablet Take 1 tablet by mouth daily.    . phentermine (ADIPEX-P) 37.5 MG tablet Take 1 tablet (37.5 mg total) by mouth daily before breakfast. 30 tablet 0   Social History   Socioeconomic History  . Marital status: Single    Spouse name: Not on file  . Number of children: 2  . Years of education: Not on file  . Highest education level: Not on file  Occupational History  . Not on file  Tobacco Use  . Smoking status: Never Smoker  . Smokeless tobacco: Never Used  Vaping Use  . Vaping Use: Never  used  Substance and Sexual Activity  . Alcohol use: No    Comment: rare -occ  . Drug use: No  . Sexual activity: Yes    Birth control/protection: Surgical    Comment: tubal  Other Topics Concern  . Not on file  Social History Narrative  . Not on file   Social Determinants of Health   Financial Resource Strain: Low Risk   . Difficulty of Paying Living Expenses: Not hard at all  Food Insecurity: Unknown  . Worried About Programme researcher, broadcasting/film/video in the Last Year: Patient refused  . Ran Out of Food in the Last Year: Patient refused  Transportation Needs: No Transportation Needs  . Lack of Transportation (Medical): No  . Lack of  Transportation (Non-Medical): No  Physical Activity: Sufficiently Active  . Days of Exercise per Week: 4 days  . Minutes of Exercise per Session: 50 min  Stress: Stress Concern Present  . Feeling of Stress : To some extent  Social Connections: Moderately Integrated  . Frequency of Communication with Friends and Family: More than three times a week  . Frequency of Social Gatherings with Friends and Family: Once a week  . Attends Religious Services: More than 4 times per year  . Active Member of Clubs or Organizations: Yes  . Attends Banker Meetings: 1 to 4 times per year  . Marital Status: Never married  Intimate Partner Violence: Not At Risk  . Fear of Current or Ex-Partner: No  . Emotionally Abused: No  . Physically Abused: No  . Sexually Abused: No   Family History  Problem Relation Age of Onset  . Hypertension Mother   . Diabetes Father   . Hypertension Father     OBJECTIVE:  Vitals:   07/10/20 1801  BP: (!) 162/90  Pulse: 82  Resp: 16  Temp: (!) 97.5 F (36.4 C)  SpO2: 98%     Physical Exam Vitals and nursing note reviewed.  Constitutional:      General: She is not in acute distress.    Appearance: Normal appearance. She is normal weight. She is not ill-appearing, toxic-appearing or diaphoretic.  HENT:     Head: Normocephalic.     Right Ear: Tympanic membrane, ear canal and external ear normal. There is no impacted cerumen.     Left Ear: Tympanic membrane, ear canal and external ear normal.  Cardiovascular:     Rate and Rhythm: Normal rate and regular rhythm.     Pulses: Normal pulses.     Heart sounds: Normal heart sounds. No murmur heard.  No friction rub. No gallop.   Pulmonary:     Effort: Pulmonary effort is normal. No respiratory distress.     Breath sounds: Normal breath sounds. No stridor. No wheezing, rhonchi or rales.  Chest:     Chest wall: No tenderness.  Neurological:     General: No focal deficit present.     Mental Status:  She is alert and oriented to person, place, and time.     GCS: GCS eye subscore is 4. GCS verbal subscore is 5. GCS motor subscore is 6.     Cranial Nerves: Cranial nerves are intact.     Sensory: Sensation is intact.     Coordination: Coordination is intact.     Gait: Gait is intact.  Psychiatric:        Mood and Affect: Mood is anxious.     LABS:  No results found for this or any previous  visit (from the past 24 hour(s)).   ASSESSMENT & PLAN:  1. COVID-19 virus infection   2. Dizziness   3. Anxiety     Meds ordered this encounter  Medications  . dexamethasone (DECADRON) 4 MG tablet    Sig: Take 1 tablet (4 mg total) by mouth daily for 7 days.    Dispense:  7 tablet    Refill:  0  . hydrOXYzine (ATARAX/VISTARIL) 25 MG tablet    Sig: Take 1 tablet (25 mg total) by mouth every 8 (eight) hours as needed for anxiety.    Dispense:  15 tablet    Refill:  0     You should remain isolated in your home for 10 days from symptom onset AND greater than 24  hours after symptoms resolution (absence of fever without the use of fever-reducing medication and improvement in respiratory symptoms), whichever is longer Get plenty of rest and push fluids Continue cough medication as prescribed  Decadron was prescribed/take as directed Hydroxyzine was prescribed for anxiety Use medications daily for symptom relief Use OTC medications like ibuprofen or tylenol as needed fever or pain Call or go to the ED if you have any new or worsening symptoms such as fever, worsening cough, shortness of breath, chest tightness, chest pain, turning blue, changes in mental status, etc...   Reviewed expectations re: course of current medical issues. Questions answered. Outlined signs and symptoms indicating need for more acute intervention. Patient verbalized understanding. After Visit Summary given.      Note: This document was prepared using Dragon voice recognition software and may include  unintentional dictation errors.    Durward Parcel, FNP 07/10/20 1859    Durward Parcel, FNP 07/10/20 1859

## 2020-07-10 NOTE — Discharge Instructions (Addendum)
You should remain isolated in your home for 10 days from symptom onset AND greater than 24  hours after symptoms resolution (absence of fever without the use of fever-reducing medication and improvement in respiratory symptoms), whichever is longer Get plenty of rest and push fluids Continue cough medication as prescribed  Decadron was prescribed/take as directed Hydroxyzine was prescribed for anxiety Use medications daily for symptom relief Use OTC medications like ibuprofen or tylenol as needed fever or pain Call or go to the ED if you have any new or worsening symptoms such as fever, worsening cough, shortness of breath, chest tightness, chest pain, turning blue, changes in mental status, etc..Marland Kitchen

## 2020-07-11 MED ORDER — DEXAMETHASONE 4 MG PO TABS: 4.0000 mg | ORAL_TABLET | Freq: Every day | ORAL | 0 refills | Status: AC

## 2020-07-11 MED ORDER — HYDROXYZINE HCL 25 MG PO TABS: 25.0000 mg | ORAL_TABLET | Freq: Three times a day (TID) | ORAL | 0 refills | Status: DC | PRN

## 2020-08-06 ENCOUNTER — Ambulatory Visit: Payer: 59 | Admitting: Podiatry

## 2020-08-20 ENCOUNTER — Other Ambulatory Visit: Payer: Self-pay | Admitting: Family Medicine

## 2020-08-28 ENCOUNTER — Other Ambulatory Visit: Payer: Self-pay

## 2020-08-28 ENCOUNTER — Ambulatory Visit (INDEPENDENT_AMBULATORY_CARE_PROVIDER_SITE_OTHER): Payer: 59 | Admitting: Orthotics

## 2020-08-28 DIAGNOSIS — M722 Plantar fascial fibromatosis: Secondary | ICD-10-CM | POA: Diagnosis not present

## 2020-08-28 DIAGNOSIS — Q666 Other congenital valgus deformities of feet: Secondary | ICD-10-CM

## 2020-08-28 NOTE — Progress Notes (Signed)

## 2020-08-28 NOTE — Addendum Note (Signed)
Addended by: Ria Clock on: 08/28/2020 09:39 AM   Modules accepted: Level of Service

## 2020-09-25 ENCOUNTER — Encounter: Payer: 59 | Admitting: Orthotics

## 2020-09-27 ENCOUNTER — Other Ambulatory Visit: Payer: Self-pay | Admitting: Family Medicine

## 2020-10-02 ENCOUNTER — Other Ambulatory Visit: Payer: Self-pay

## 2020-10-02 ENCOUNTER — Ambulatory Visit (INDEPENDENT_AMBULATORY_CARE_PROVIDER_SITE_OTHER): Payer: 59 | Admitting: Orthotics

## 2020-10-02 DIAGNOSIS — M722 Plantar fascial fibromatosis: Secondary | ICD-10-CM

## 2020-10-02 NOTE — Progress Notes (Signed)
Patient picked up f/o and was pleased with fit, comfort, and function.  Worked well with footwear.  Told of rbeak in period and how to report any issues.  

## 2020-10-03 ENCOUNTER — Telehealth: Payer: Self-pay | Admitting: Family Medicine

## 2020-10-03 MED ORDER — HYDROCHLOROTHIAZIDE 12.5 MG PO CAPS
12.5000 mg | ORAL_CAPSULE | Freq: Every day | ORAL | 0 refills | Status: DC
Start: 2020-10-03 — End: 2020-10-07

## 2020-10-03 NOTE — Telephone Encounter (Signed)
refill Microzide 12.5

## 2020-10-03 NOTE — Telephone Encounter (Signed)
1 month refill sent, pt needs OV

## 2020-10-07 ENCOUNTER — Encounter: Payer: Self-pay | Admitting: Family Medicine

## 2020-10-07 ENCOUNTER — Ambulatory Visit (INDEPENDENT_AMBULATORY_CARE_PROVIDER_SITE_OTHER): Payer: 59 | Admitting: Family Medicine

## 2020-10-07 ENCOUNTER — Other Ambulatory Visit: Payer: Self-pay

## 2020-10-07 VITALS — BP 128/85 | HR 78 | Temp 98.2°F | Wt 232.0 lb

## 2020-10-07 DIAGNOSIS — I10 Essential (primary) hypertension: Secondary | ICD-10-CM

## 2020-10-07 DIAGNOSIS — E669 Obesity, unspecified: Secondary | ICD-10-CM

## 2020-10-07 DIAGNOSIS — Z1159 Encounter for screening for other viral diseases: Secondary | ICD-10-CM

## 2020-10-07 DIAGNOSIS — E119 Type 2 diabetes mellitus without complications: Secondary | ICD-10-CM | POA: Diagnosis not present

## 2020-10-07 DIAGNOSIS — Z1212 Encounter for screening for malignant neoplasm of rectum: Secondary | ICD-10-CM

## 2020-10-07 DIAGNOSIS — Z9889 Other specified postprocedural states: Secondary | ICD-10-CM

## 2020-10-07 DIAGNOSIS — Z1211 Encounter for screening for malignant neoplasm of colon: Secondary | ICD-10-CM

## 2020-10-07 MED ORDER — HYDROCHLOROTHIAZIDE 12.5 MG PO CAPS
12.5000 mg | ORAL_CAPSULE | Freq: Every day | ORAL | 1 refills | Status: DC
Start: 2020-10-07 — End: 2021-04-24

## 2020-10-07 NOTE — Assessment & Plan Note (Signed)
Referral for colonoscopy °

## 2020-10-07 NOTE — Assessment & Plan Note (Signed)
Discussed reducing sugar sweetened beverages and simple carbs in the diet.

## 2020-10-07 NOTE — Assessment & Plan Note (Signed)
Currently diet controlled.  She will return for fasting labs.  We will plan to start her on a Cinthya GLP-1 therapy with her history of gastric surgery increasing weight and her comorbidities she would benefit from weight reduction.

## 2020-10-07 NOTE — Patient Instructions (Addendum)
Return for fasting labs next week We will plan to start Ozempic 025mg  once a week  Dr. - Labuer  Referral for colonoscopy  F/U 6 weeks

## 2020-10-07 NOTE — Assessment & Plan Note (Signed)
Blood pressure mild elevation in her diastolic today.  She has been back on her medications for the past 2 days.  We will recheck at her next visit

## 2020-10-07 NOTE — Progress Notes (Signed)
Subjective:    Patient ID: Ariel [name], female    DOB: 05-03-1971, 49 y.o.   MRN: [id number]  Patient presents for Obesity (Secondary, post-covid, periodic dizziness)  Pt here to f/u chronic medical problems  She had covid-19 bac in Sept, still has some  Seen at [facility], she was given atarax due to high anxiety dudring the COVID also prescribed steroid but she never received She still has some episodes where she feels "woozy" but otherwise is back to her baseline.  HTN- her bp was up when she was off her meds  Restarted meds on Saturday   Obesity- she was walking which was helping but then she had plantar fasciitis and had to stop walking.  She would like to try something else for her weight loss.  She had bariatric surgery in 2017 and this found herself regaining the weight.  She does try to reduce her sweets and her carbs.  But feels like she needs some help. Was on phentermine back in June this is given by her gynecologist she thinks that she lost some inches but she never weighed herself.  Not have any side effects with the medication.  Pt to bring vaccine card back  Review Of Systems:  GEN- denies fatigue, fever, weight loss,weakness, recent illness HEENT- denies eye drainage, change in vision, nasal discharge, CVS- denies chest pain, palpitations RESP- denies SOB, cough, wheeze ABD- denies N/V, change in stools, abd pain GU- denies dysuria, hematuria, dribbling, incontinence MSK- denies joint pain, muscle aches, injury Neuro- denies headache, dizziness, syncope, seizure activity       Objective:    BP 128/85   Pulse 78   Temp 98.2 F (36.8 C)   Wt 232 lb (105.2 kg)   SpO2 98%   BMI 38.61 kg/m  GEN- NAD, alert and oriented x3 HEENT- PERRL, EOMI, non injected sclera, pink conjunctiva, MMM, oropharynx clear Neck- Supple, no thyromegaly CVS- RRR, no murmur RESP-CTAB ABD-NABS,soft,NT,ND EXT- No edema Pulses- Radial, DP- 2+        Assessment & Plan:       Problem List Items Addressed This Visit      Unprioritized   Diabetes mellitus (HCC)    Currently diet controlled.  She will return for fasting labs.  We will plan to start her on a Cinthya GLP-1 therapy with her history of gastric surgery increasing weight and her comorbidities she would benefit from weight reduction.      Relevant Orders   Hemoglobin A1c   Lipid panel   Microalbumin / creatinine urine ratio   HM DIABETES FOOT EXAM (Completed)   Essential hypertension, benign - Primary    Blood pressure mild elevation in her diastolic today.  She has been back on her medications for the past 2 days.  We will recheck at her next visit      Relevant Medications   hydrochlorothiazide (MICROZIDE) 12.5 MG capsule   Other Relevant Orders   CBC with Differential/Platelet   Comprehensive metabolic panel   Lipid panel   VITAMIN D 25 Hydroxy (Vit-D Deficiency, Fractures)   History of gastric surgery   Relevant Orders   VITAMIN D 25 Hydroxy (Vit-D Deficiency, Fractures)   Vitamin B12   Iron, TIBC and Ferritin Panel   Obesity (BMI 30-39.9)    Discussed reducing sugar sweetened beverages and simple carbs in the diet.      Relevant Orders   VITAMIN D 25 Hydroxy (Vit-D Deficiency, Fractures)   Vitamin B12   Iron,  TIBC and Ferritin Panel   Screening for colorectal cancer    Referral for colonoscopy.      Relevant Orders   Ambulatory referral to Gastroenterology    Other Visit Diagnoses    Need for hepatitis C screening test       Relevant Orders   Hepatitis C antibody      Note: This dictation was prepared with Dragon dictation along with smaller phrase technology. Any transcriptional errors that result from this process are unintentional.

## 2020-10-08 ENCOUNTER — Encounter (INDEPENDENT_AMBULATORY_CARE_PROVIDER_SITE_OTHER): Payer: Self-pay | Admitting: *Deleted

## 2020-11-01 ENCOUNTER — Encounter: Payer: Self-pay | Admitting: Family Medicine

## 2020-11-01 ENCOUNTER — Other Ambulatory Visit: Payer: 59

## 2020-11-01 ENCOUNTER — Other Ambulatory Visit: Payer: Self-pay

## 2020-11-01 DIAGNOSIS — I10 Essential (primary) hypertension: Secondary | ICD-10-CM

## 2020-11-01 DIAGNOSIS — E669 Obesity, unspecified: Secondary | ICD-10-CM

## 2020-11-01 DIAGNOSIS — E119 Type 2 diabetes mellitus without complications: Secondary | ICD-10-CM

## 2020-11-01 DIAGNOSIS — Z1159 Encounter for screening for other viral diseases: Secondary | ICD-10-CM

## 2020-11-01 DIAGNOSIS — Z9889 Other specified postprocedural states: Secondary | ICD-10-CM

## 2020-11-04 ENCOUNTER — Other Ambulatory Visit: Payer: Self-pay | Admitting: Family Medicine

## 2020-11-04 LAB — COMPREHENSIVE METABOLIC PANEL
AG Ratio: 1.3 (calc) (ref 1.0–2.5)
ALT: 12 U/L (ref 6–29)
AST: 14 U/L (ref 10–35)
Albumin: 4 g/dL (ref 3.6–5.1)
Alkaline phosphatase (APISO): 61 U/L (ref 37–153)
BUN: 8 mg/dL (ref 7–25)
CO2: 26 mmol/L (ref 20–32)
Calcium: 9 mg/dL (ref 8.6–10.4)
Chloride: 101 mmol/L (ref 98–110)
Creat: 0.7 mg/dL (ref 0.50–1.05)
Globulin: 3 g/dL (calc) (ref 1.9–3.7)
Glucose, Bld: 101 mg/dL — ABNORMAL HIGH (ref 65–99)
Potassium: 3.6 mmol/L (ref 3.5–5.3)
Sodium: 136 mmol/L (ref 135–146)
Total Bilirubin: 0.4 mg/dL (ref 0.2–1.2)
Total Protein: 7 g/dL (ref 6.1–8.1)

## 2020-11-04 LAB — IRON,TIBC AND FERRITIN PANEL
%SAT: 13 % (calc) — ABNORMAL LOW (ref 16–45)
Ferritin: 30 ng/mL (ref 16–232)
Iron: 45 ug/dL (ref 45–160)
TIBC: 336 mcg/dL (calc) (ref 250–450)

## 2020-11-04 LAB — CBC WITH DIFFERENTIAL/PLATELET
Absolute Monocytes: 554 cells/uL (ref 200–950)
Basophils Absolute: 32 cells/uL (ref 0–200)
Basophils Relative: 0.7 %
Eosinophils Absolute: 162 cells/uL (ref 15–500)
Eosinophils Relative: 3.6 %
HCT: 35.3 % (ref 35.0–45.0)
Hemoglobin: 11.6 g/dL — ABNORMAL LOW (ref 11.7–15.5)
Lymphs Abs: 1607 cells/uL (ref 850–3900)
MCH: 26.6 pg — ABNORMAL LOW (ref 27.0–33.0)
MCHC: 32.9 g/dL (ref 32.0–36.0)
MCV: 81 fL (ref 80.0–100.0)
MPV: 11.2 fL (ref 7.5–12.5)
Monocytes Relative: 12.3 %
Neutro Abs: 2147 cells/uL (ref 1500–7800)
Neutrophils Relative %: 47.7 %
Platelets: 264 10*3/uL (ref 140–400)
RBC: 4.36 10*6/uL (ref 3.80–5.10)
RDW: 13.1 % (ref 11.0–15.0)
Total Lymphocyte: 35.7 %
WBC: 4.5 10*3/uL (ref 3.8–10.8)

## 2020-11-04 LAB — LIPID PANEL
Cholesterol: 173 mg/dL (ref <200)
HDL: 42 mg/dL — ABNORMAL LOW (ref >=50)
LDL Cholesterol (Calc): 106 mg/dL (calc) — ABNORMAL HIGH
Non-HDL Cholesterol (Calc): 131 mg/dL (calc) — ABNORMAL HIGH (ref <130)
Total CHOL/HDL Ratio: 4.1 (calc) (ref <5.0)
Triglycerides: 131 mg/dL (ref <150)

## 2020-11-04 LAB — MICROALBUMIN / CREATININE URINE RATIO
Creatinine, Urine: 245 mg/dL (ref 20–275)
Microalb Creat Ratio: 4 mcg/mg creat (ref <30)
Microalb, Ur: 0.9 mg/dL

## 2020-11-04 LAB — HEMOGLOBIN A1C
Hgb A1c MFr Bld: 6.3 % of total Hgb — ABNORMAL HIGH (ref <5.7)
Mean Plasma Glucose: 134 mg/dL
eAG (mmol/L): 7.4 mmol/L

## 2020-11-04 LAB — VITAMIN D 25 HYDROXY (VIT D DEFICIENCY, FRACTURES): Vit D, 25-Hydroxy: 35 ng/mL (ref 30–100)

## 2020-11-04 LAB — HEPATITIS C ANTIBODY
Hepatitis C Ab: NONREACTIVE
SIGNAL TO CUT-OFF: 0.01 (ref <1.00)

## 2020-11-04 LAB — VITAMIN B12: Vitamin B-12: 813 pg/mL (ref 200–1100)

## 2020-11-04 MED ORDER — OZEMPIC (0.25 OR 0.5 MG/DOSE) 2 MG/1.5ML ~~LOC~~ SOPN: 0.2500 mg | PEN_INJECTOR | SUBCUTANEOUS | 2 refills | Status: DC

## 2020-11-04 NOTE — Progress Notes (Signed)
Start ozempic for DM and weight

## 2020-11-05 ENCOUNTER — Telehealth: Payer: Self-pay | Admitting: Family Medicine

## 2020-11-05 NOTE — Telephone Encounter (Signed)
Received fax stating a PA was needed on OZEMPIC, 0.25 OR 0.5 MG/DOSE, 2 MG/1.5ML SOPN  KEY: [id number]

## 2020-11-07 NOTE — Telephone Encounter (Signed)
Received request from pharmacy for PA on Ozempic.  PA submitted.   Dx: E11.9- DM, E66.09- obesity.    Your information has been submitted to [employer]. To check for an updated outcome later, reopen this PA request from your dashboard.  If [employer] has not responded to your request within 24 hours, contact [employer] at [phone]. If you think there may be a problem with your PA request, use our live chat feature at the bottom right.

## 2020-11-11 NOTE — Telephone Encounter (Signed)
Received PA determination.   PA approved 11/10/2020- 11/10/2021.

## 2020-11-12 ENCOUNTER — Ambulatory Visit: Payer: 59 | Admitting: Family Medicine

## 2020-12-13 ENCOUNTER — Ambulatory Visit (INDEPENDENT_AMBULATORY_CARE_PROVIDER_SITE_OTHER): Payer: 59 | Admitting: Family Medicine

## 2020-12-13 ENCOUNTER — Encounter: Payer: Self-pay | Admitting: Family Medicine

## 2020-12-13 ENCOUNTER — Other Ambulatory Visit: Payer: Self-pay

## 2020-12-13 VITALS — BP 128/80 | HR 72 | Temp 98.2°F | Resp 14 | Ht 65.0 in | Wt 233.0 lb

## 2020-12-13 DIAGNOSIS — Z6838 Body mass index (BMI) 38.0-38.9, adult: Secondary | ICD-10-CM | POA: Diagnosis not present

## 2020-12-13 DIAGNOSIS — Z9889 Other specified postprocedural states: Secondary | ICD-10-CM

## 2020-12-13 DIAGNOSIS — E119 Type 2 diabetes mellitus without complications: Secondary | ICD-10-CM | POA: Diagnosis not present

## 2020-12-13 DIAGNOSIS — E669 Obesity, unspecified: Secondary | ICD-10-CM

## 2020-12-13 DIAGNOSIS — D508 Other iron deficiency anemias: Secondary | ICD-10-CM

## 2020-12-13 DIAGNOSIS — D509 Iron deficiency anemia, unspecified: Secondary | ICD-10-CM | POA: Insufficient documentation

## 2020-12-13 MED ORDER — OZEMPIC (0.25 OR 0.5 MG/DOSE) 2 MG/1.5ML ~~LOC~~ SOPN
PEN_INJECTOR | SUBCUTANEOUS | 1 refills | Status: DC
Start: 2020-12-13 — End: 2021-02-13

## 2020-12-13 MED ORDER — SM IRON SLOW RELEASE 160 (50 FE) MG PO TBCR: 160.0000 mg | EXTENDED_RELEASE_TABLET | Freq: Every day | ORAL | 2 refills | Status: DC

## 2020-12-13 NOTE — Assessment & Plan Note (Addendum)
A1C should improve with ozempic Does not need to check CBG at this time Concentrate on reducing sugars in diet Goal 4 cups of water a day  For night snacks, try Yogurt (More protein) , popcorn, fruit/veggies to snack on   Increase ozempic to  0.5mg  once a week  Recheck labs at next visit in 2 months

## 2020-12-13 NOTE — Assessment & Plan Note (Signed)
Slow iron sent to pharmacy

## 2020-12-13 NOTE — Progress Notes (Signed)
   Subjective:    Patient ID: [name], female    DOB: 08/01/1971, 50 y.o.   MRN: [id number]  Patient presents for Follow-up (Weight loss)  Patient here to follow-up medication for weight loss.  She is currently on Ozempic 0.25 mg once a week. History of bariatric surgery.  However was starting to regain her weight.  At her visit in December was 232 pounds with a BMI of 38.6 Her A1c returned at 6.3% showing increase in glucose- has DM.   She was also noted to have iron deficiency she was started on iron 325 mg once a day  Works 2nd shift She is packing dinner some days, she has a salad bar at work eats 3-4 times a week  She does drink premier protein shakes for breakfast  She has cut bread,and down to 1 soda a day (mountain dew) Not drinking verymuch water Drinks a small grapefruit  Juice some days    Often does eat mid day  Has been on Ozempic medication for 3 weeks without any side effects     Review Of Systems:  GEN- denies fatigue, fever, weight loss,weakness, recent illness HEENT- denies eye drainage, change in vision, nasal discharge, CVS- denies chest pain, palpitations RESP- denies SOB, cough, wheeze ABD- denies N/V, change in stools, abd pain Neuro- denies headache, dizziness, syncope, seizure activity       Objective:    BP 128/80   Pulse 72   Temp 98.2 F (36.8 C) (Temporal)   Resp 14   Ht 5\' 5"  (1.651 m)   Wt 233 lb (105.7 kg)   SpO2 98%   BMI 38.77 kg/m  GEN- NAD, alert and oriented x3 CVS- RRR, no murmur RESP-CTAB EXT- No edema Pulses- Radial,  2+        Assessment & Plan:      Problem List Items Addressed This Visit      Unprioritized   BMI 38.0-38.9,adult - Primary   Class 2 obesity   Diabetes mellitus (HCC)    A1C should improve with ozempic Does not need to check CBG at this time Concentrate on reducing sugars in diet Goal 4 cups of water a day  Increase ozempic to  0.5mg  once a week  Recheck labs at next visit in 2 months        Relevant Medications   Semaglutide,0.25 or 0.5MG /DOS, (OZEMPIC, 0.25 OR 0.5 MG/DOSE,) 2 MG/1.5ML SOPN   History of gastric surgery   Iron deficiency anemia    Slow iron sent to pharmacy       Relevant Medications   ferrous sulfate (SM IRON SLOW RELEASE) 160 (50 Fe) MG TBCR SR tablet      Note: This dictation was prepared with Dragon dictation along with smaller phrase technology. Any transcriptional errors that result from this process are unintentional.

## 2020-12-13 NOTE — Patient Instructions (Addendum)
Add more water- goal 4 cups of water ( 32oz)  For night snacks, try Yogurt (More protein) , popcorn, fruit/veggies to snack on  Keep the protein shake in the morning  For Ozempic increase to 0.5mg  once a week with bigger meal on Saturday  Slow iron once a day sent to pharmacy  Schedule with eye doctor    F/U 2 months with Shanda Bumps

## 2021-01-20 ENCOUNTER — Encounter (HOSPITAL_COMMUNITY): Payer: Self-pay | Admitting: Emergency Medicine

## 2021-01-20 ENCOUNTER — Emergency Department (HOSPITAL_COMMUNITY): Payer: 59

## 2021-01-20 ENCOUNTER — Emergency Department (HOSPITAL_COMMUNITY)
Admission: EM | Admit: 2021-01-20 | Discharge: 2021-01-20 | Disposition: A | Discharge status: Home / Self Care | Payer: 59 | Attending: Emergency Medicine | Admitting: Emergency Medicine

## 2021-01-20 ENCOUNTER — Other Ambulatory Visit: Payer: Self-pay

## 2021-01-20 DIAGNOSIS — I10 Essential (primary) hypertension: Secondary | ICD-10-CM | POA: Diagnosis not present

## 2021-01-20 DIAGNOSIS — S3991XA Unspecified injury of abdomen, initial encounter: Secondary | ICD-10-CM | POA: Diagnosis not present

## 2021-01-20 DIAGNOSIS — E119 Type 2 diabetes mellitus without complications: Secondary | ICD-10-CM | POA: Insufficient documentation

## 2021-01-20 DIAGNOSIS — S161XXA Strain of muscle, fascia and tendon at neck level, initial encounter: Secondary | ICD-10-CM | POA: Diagnosis not present

## 2021-01-20 DIAGNOSIS — S0990XA Unspecified injury of head, initial encounter: Secondary | ICD-10-CM | POA: Diagnosis not present

## 2021-01-20 DIAGNOSIS — T1490XA Injury, unspecified, initial encounter: Secondary | ICD-10-CM

## 2021-01-20 DIAGNOSIS — Z794 Long term (current) use of insulin: Secondary | ICD-10-CM | POA: Diagnosis not present

## 2021-01-20 DIAGNOSIS — Z79899 Other long term (current) drug therapy: Secondary | ICD-10-CM | POA: Insufficient documentation

## 2021-01-20 DIAGNOSIS — M545 Low back pain, unspecified: Secondary | ICD-10-CM | POA: Diagnosis not present

## 2021-01-20 DIAGNOSIS — Y92410 Unspecified street and highway as the place of occurrence of the external cause: Secondary | ICD-10-CM | POA: Diagnosis not present

## 2021-01-20 DIAGNOSIS — S199XXA Unspecified injury of neck, initial encounter: Secondary | ICD-10-CM | POA: Diagnosis present

## 2021-01-20 LAB — CBC WITH DIFFERENTIAL/PLATELET
Abs Immature Granulocytes: 0 10*3/uL (ref 0.00–0.07)
Basophils Absolute: 0 10*3/uL (ref 0.0–0.1)
Basophils Relative: 1 %
Eosinophils Absolute: 0.1 10*3/uL (ref 0.0–0.5)
Eosinophils Relative: 3 %
HCT: 37.9 % (ref 36.0–46.0)
Hemoglobin: 12.1 g/dL (ref 12.0–15.0)
Immature Granulocytes: 0 %
Lymphocytes Relative: 35 %
Lymphs Abs: 1.5 10*3/uL (ref 0.7–4.0)
MCH: 26.6 pg (ref 26.0–34.0)
MCHC: 31.9 g/dL (ref 30.0–36.0)
MCV: 83.3 fL (ref 80.0–100.0)
Monocytes Absolute: 0.4 10*3/uL (ref 0.1–1.0)
Monocytes Relative: 10 %
Neutro Abs: 2.2 10*3/uL (ref 1.7–7.7)
Neutrophils Relative %: 51 %
Platelets: 280 10*3/uL (ref 150–400)
RBC: 4.55 MIL/uL (ref 3.87–5.11)
RDW: 13.2 % (ref 11.5–15.5)
WBC: 4.3 10*3/uL (ref 4.0–10.5)
nRBC: 0 % (ref 0.0–0.2)

## 2021-01-20 LAB — COMPREHENSIVE METABOLIC PANEL
ALT: 16 U/L (ref 0–44)
AST: 19 U/L (ref 15–41)
Albumin: 3.7 g/dL (ref 3.5–5.0)
Alkaline Phosphatase: 58 U/L (ref 38–126)
Anion gap: 8 (ref 5–15)
BUN: 13 mg/dL (ref 6–20)
CO2: 28 mmol/L (ref 22–32)
Calcium: 8.8 mg/dL — ABNORMAL LOW (ref 8.9–10.3)
Chloride: 103 mmol/L (ref 98–111)
Creatinine, Ser: 0.69 mg/dL (ref 0.44–1.00)
GFR, Estimated: >60 mL/min (ref >60)
Glucose, Bld: 84 mg/dL (ref 70–99)
Potassium: 3.2 mmol/L — ABNORMAL LOW (ref 3.5–5.1)
Sodium: 139 mmol/L (ref 135–145)
Total Bilirubin: 0.6 mg/dL (ref 0.3–1.2)
Total Protein: 7.5 g/dL (ref 6.5–8.1)

## 2021-01-20 MED ORDER — METHOCARBAMOL 500 MG PO TABS: 500.0000 mg | ORAL_TABLET | Freq: Three times a day (TID) | ORAL | 0 refills | Status: DC

## 2021-01-20 MED ORDER — IOHEXOL 300 MG/ML  SOLN
100.0000 mL | Freq: Once | INTRAMUSCULAR | Status: AC | PRN
  Administered 2021-01-20: 100 mL via INTRAVENOUS

## 2021-01-20 MED ORDER — OXYCODONE-ACETAMINOPHEN 5-325 MG PO TABS
1.0000 | ORAL_TABLET | Freq: Once | ORAL | Status: AC
  Administered 2021-01-20: 1 via ORAL
  Filled 2021-01-20: qty 1

## 2021-01-20 MED ORDER — TRAMADOL HCL 50 MG PO TABS: 50.0000 mg | ORAL_TABLET | Freq: Four times a day (QID) | ORAL | 0 refills | Status: DC | PRN
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
## 2021-01-20 NOTE — Discharge Instructions (Signed)
The CT scans of your head, cervical spine, lower back and abdomen were all reassuring.  I recommend that you apply ice packs on and off to your lower back and neck.  Avoid bending, twisting or heavy lifting for several days.  Please follow-up with your primary care provider for recheck.  Return emergency department for any worsening symptoms.

## 2021-01-21 ENCOUNTER — Telehealth: Payer: Self-pay | Admitting: *Deleted

## 2021-01-21 NOTE — Telephone Encounter (Signed)
Transition Care Management Unsuccessful Follow-up Telephone Call  Date of discharge and from where:  01/20/2021 [name]  Attempts:  1st Attempt  Reason for unsuccessful TCM follow-up call:  Left voice message

## 2021-01-22 NOTE — Telephone Encounter (Signed)
Transition Care Management Unsuccessful Follow-up Telephone Call  Date of discharge and from where:  01/20/2021 [name]  Attempts:  2nd Attempt  Reason for unsuccessful TCM follow-up call:  Left voice message

## 2021-01-23 ENCOUNTER — Encounter: Payer: Self-pay | Admitting: Nurse Practitioner

## 2021-01-23 ENCOUNTER — Ambulatory Visit: Payer: 59 | Admitting: Nurse Practitioner

## 2021-01-23 ENCOUNTER — Other Ambulatory Visit: Payer: Self-pay

## 2021-01-23 VITALS — BP 132/86 | HR 84 | Temp 98.4°F | Ht 65.0 in | Wt 228.0 lb

## 2021-01-23 DIAGNOSIS — M542 Cervicalgia: Secondary | ICD-10-CM | POA: Diagnosis not present

## 2021-01-23 DIAGNOSIS — M545 Low back pain, unspecified: Secondary | ICD-10-CM | POA: Diagnosis not present

## 2021-01-23 NOTE — Telephone Encounter (Signed)
Transition Care Management Follow-up Telephone Call  Date of discharge and from where: 01/20/2021 [facility]  How have you been since you were released from the hospital? "I am fine"  Any questions or concerns? No  Items Reviewed:  Did the pt receive and understand the discharge instructions provided? Yes   Medications obtained and verified? Yes   Other? No   Any new allergies since your discharge? No   Dietary orders reviewed? No  Do you have support at home? Yes    Functional Questionnaire: (I = Independent and D = Dependent) ADLs: I  Bathing/Dressing- I  Meal Prep- I  Eating- I  Maintaining continence- I  Transferring/Ambulation- I  Managing Meds- I  Follow up appointments reviewed:   PCP Hospital f/u appt confirmed? No    Specialist Hospital f/u appt confirmed? No    Are transportation arrangements needed? No   If their condition worsens, is the pt aware to call PCP or go to the Emergency Dept.? Yes  Was the patient provided with contact information for the PCP's office or ED? Yes  Was to pt encouraged to call back with questions or concerns? Yes
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
## 2021-01-28 ENCOUNTER — Telehealth: Payer: Self-pay | Admitting: Nurse Practitioner

## 2021-01-28 ENCOUNTER — Ambulatory Visit: Payer: 59 | Admitting: Nurse Practitioner

## 2021-01-28 NOTE — Telephone Encounter (Signed)
Patient missed appointment today; needs the excuse note extended at least until Thursday 4/21, reschedule date. Patient sitting in parking lot and will come into the office in 5 minutes.

## 2021-01-28 NOTE — Telephone Encounter (Signed)
Letter given to pt

## 2021-01-30 ENCOUNTER — Other Ambulatory Visit: Payer: Self-pay

## 2021-01-30 ENCOUNTER — Encounter: Payer: Self-pay | Admitting: Nurse Practitioner

## 2021-01-30 ENCOUNTER — Ambulatory Visit (INDEPENDENT_AMBULATORY_CARE_PROVIDER_SITE_OTHER): Payer: 59 | Admitting: Nurse Practitioner

## 2021-01-30 VITALS — BP 144/84 | HR 82 | Temp 97.5°F | Ht 65.0 in | Wt 229.6 lb

## 2021-01-30 DIAGNOSIS — M545 Low back pain, unspecified: Secondary | ICD-10-CM | POA: Diagnosis not present

## 2021-01-30 DIAGNOSIS — M542 Cervicalgia: Secondary | ICD-10-CM | POA: Diagnosis not present

## 2021-01-30 MED ORDER — TRAMADOL HCL 50 MG PO TABS: 50.0000 mg | ORAL_TABLET | Freq: Four times a day (QID) | ORAL | 0 refills | Status: DC | PRN
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
## 2021-02-13 ENCOUNTER — Ambulatory Visit (INDEPENDENT_AMBULATORY_CARE_PROVIDER_SITE_OTHER): Payer: 59 | Admitting: Nurse Practitioner

## 2021-02-13 ENCOUNTER — Encounter: Payer: Self-pay | Admitting: Nurse Practitioner

## 2021-02-13 ENCOUNTER — Other Ambulatory Visit: Payer: Self-pay

## 2021-02-13 VITALS — BP 136/80 | HR 79 | Temp 98.0°F | Ht 65.0 in | Wt 226.6 lb

## 2021-02-13 DIAGNOSIS — E119 Type 2 diabetes mellitus without complications: Secondary | ICD-10-CM | POA: Diagnosis not present

## 2021-02-13 DIAGNOSIS — Z6838 Body mass index (BMI) 38.0-38.9, adult: Secondary | ICD-10-CM

## 2021-02-13 DIAGNOSIS — S46912A Strain of unspecified muscle, fascia and tendon at shoulder and upper arm level, left arm, initial encounter: Secondary | ICD-10-CM | POA: Diagnosis not present

## 2021-02-13 LAB — BASIC METABOLIC PANEL WITH GFR
BUN: 13 mg/dL (ref 7–25)
CO2: 29 mmol/L (ref 20–32)
Calcium: 9.6 mg/dL (ref 8.6–10.4)
Chloride: 101 mmol/L (ref 98–110)
Creat: 0.74 mg/dL (ref 0.50–1.05)
GFR, Est African American: 109 mL/min/{1.73_m2} (ref >=60)
GFR, Est Non African American: 94 mL/min/{1.73_m2} (ref >=60)
Glucose, Bld: 85 mg/dL (ref 65–99)
Potassium: 3.6 mmol/L (ref 3.5–5.3)
Sodium: 139 mmol/L (ref 135–146)

## 2021-02-13 MED ORDER — DICLOFENAC SODIUM 1 % EX GEL: 2.0000 g | Freq: Four times a day (QID) | CUTANEOUS | 2 refills | Status: DC

## 2021-02-13 MED ORDER — SEMAGLUTIDE (1 MG/DOSE) 4 MG/3ML ~~LOC~~ SOPN: 1.0000 mg | PEN_INJECTOR | SUBCUTANEOUS | 2 refills | Status: DC

## 2021-02-13 MED ORDER — METHOCARBAMOL 500 MG PO TABS: 500.0000 mg | ORAL_TABLET | Freq: Three times a day (TID) | ORAL | 0 refills | Status: DC

## 2021-02-13 NOTE — Assessment & Plan Note (Addendum)
Chronic.  Last A1c in January 2022 was 6.3%.  Tolerating Ozempic 0.5 mg weekly well.  Will increase to 1 mg weekly, new prescription sent into pharmacy.  BMET checked today for kidney function.  Encouraged increasing physical activity - goal is 30 minutes 5 times weekly.  Increase water intake - goal is 64 oz daily.  Foot exam and urine microalbumin are up to date.  Needs to schedule eye exam.  A1c at next visit.  Follow up in 3 months.
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
## 2021-02-17 ENCOUNTER — Other Ambulatory Visit: Payer: Self-pay

## 2021-02-17 ENCOUNTER — Encounter (HOSPITAL_COMMUNITY): Payer: Self-pay

## 2021-02-17 ENCOUNTER — Ambulatory Visit (HOSPITAL_COMMUNITY): Payer: 59 | Attending: Nurse Practitioner

## 2021-02-17 DIAGNOSIS — M6281 Muscle weakness (generalized): Secondary | ICD-10-CM

## 2021-02-17 DIAGNOSIS — M542 Cervicalgia: Secondary | ICD-10-CM | POA: Insufficient documentation

## 2021-02-17 DIAGNOSIS — M545 Low back pain, unspecified: Secondary | ICD-10-CM | POA: Insufficient documentation
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
## 2021-02-17 NOTE — Patient Instructions (Signed)
Access Code: [email or web address] URL: [email or web address] Date: 02/17/2021 Prepared by: Shary Decamp  Exercises Mid-Lower Cervical Extension SNAG with Strap - 1 x daily - 7 x weekly - 3 sets - 10 reps - 2 sec hold Seated Assisted Cervical Rotation with Towel - 1 x daily - 7 x weekly - 3 sets - 10 reps - 2 sec hold Upper Trapezius Stretch - 1 x daily - 7 x weekly - 3 sets - 5 reps - 5-10 sec hold Supine Lower Trunk Rotation - 1 x daily - 7 x weekly - 3 sets - 10 reps Supine Posterior Pelvic Tilt - 1 x daily - 7 x weekly - 3 sets - 10 reps

## 2021-02-19 ENCOUNTER — Encounter (HOSPITAL_COMMUNITY): Payer: Self-pay | Admitting: Physical Therapy

## 2021-02-19 ENCOUNTER — Ambulatory Visit (HOSPITAL_COMMUNITY): Payer: 59 | Admitting: Physical Therapy

## 2021-02-19 ENCOUNTER — Other Ambulatory Visit: Payer: Self-pay

## 2021-02-19 DIAGNOSIS — M545 Low back pain, unspecified: Secondary | ICD-10-CM | POA: Diagnosis not present

## 2021-02-19 DIAGNOSIS — M542 Cervicalgia: Secondary | ICD-10-CM

## 2021-02-19 DIAGNOSIS — M6281 Muscle weakness (generalized): Secondary | ICD-10-CM

## 2021-02-19 NOTE — Patient Instructions (Signed)
Access Code: [email or web address] URL: [email or web address] Date: 02/19/2021 Prepared by: Georges Lynch  Exercises Supine Cervical Retraction with Towel - 2 x daily - 7 x weekly - 2 sets - 10 reps - 3 second hold Supine Scapular Retraction - 2 x daily - 7 x weekly - 2 sets - 10 reps - 3 second hold Supine March - 2 x daily - 7 x weekly - 2 sets - 10 reps

## 2021-02-19 NOTE — Therapy (Signed)
[facility] [address] [place], [place], [address] Phone: [phone]   Fax:  [phone]  Physical Therapy Treatment  Patient Details  Name: Ariel [name] MRN: [id number] Date of Birth: 02/18/1971 Referring Provider (PT): Valentino Nose, NP   Encounter Date: 02/19/2021   PT End of Session - 02/19/21 0958    Visit Number 2    Number of Visits 12    Date for PT Re-Evaluation 03/31/21    Authorization Type Aetna NAP, no auth, VL medical necessity    PT Start Time [phone]    PT Stop Time 1033    PT Time Calculation (min) 40 min    Activity Tolerance Patient tolerated treatment well    Behavior During Therapy [facility] for tasks assessed/performed           Past Medical History:  Diagnosis Date  . Boil of buttock 09/12/2015   comes and goes- not at present  . BV (bacterial vaginosis) 08/09/2013   03-05-16 no problems now  . Diabetes mellitus without complication (HCC)    borderline  . Hypertension   . Obesity   . Panic attack 07/2015   wakes up from sleep with panic attack  . Transfusion history    25 yrs ago after childbirth -NVD  . Vaginal discharge 04/15/2016  . Vaginal itching 08/09/2013   no problem now  . Yeast infection 04/15/2016    Past Surgical History:  Procedure Laterality Date  . LAPAROSCOPIC GASTRIC SLEEVE RESECTION N/A 03/17/2016   Procedure: LAPAROSCOPIC GASTRIC SLEEVE RESECTION WITH UPPER ENDOSCOPY;  Surgeon: Ovidio Kin, MD;  Location: [facility];  Service: General;  Laterality: N/A;  . TUBAL LIGATION    . UPPER GI ENDOSCOPY  03/17/2016   Procedure: UPPER GI ENDOSCOPY;  Surgeon: Ovidio Kin, MD;  Location: [facility];  Service: General;;  . WISDOM TOOTH EXTRACTION      There were no vitals filed for this visit.   Subjective Assessment - 02/19/21 0957    Subjective Patient says she was sore after eval. She is doing fine with HEP. Feeling stiff today. Not much pain though.    Limitations Lifting;Walking;House hold  activities    How long can you sit comfortably? 15-30 min    How long can you stand comfortably? 15-30 min    How long can you walk comfortably? 15-30 min    Diagnostic tests CT scan after accident, no fractures reported per pt    Patient Stated Goals "Get out of pain"    Currently in Pain? No/denies    Pain Onset 1 to 4 weeks ago    Pain Onset 1 to 4 weeks ago                             [facility] Adult PT Treatment/Exercise - 02/19/21 0001      Neck Exercises: Seated   Other Seated Exercise Cervical SNAG for extension and rotation x10 each      Neck Exercises: Supine   Neck Retraction 10 reps    Other Supine Exercise scapular retraction 10 x 3"      Lumbar Exercises: Stretches   Lower Trunk Rotation 5 reps;10 seconds      Lumbar Exercises: Supine   Ab Set 10 reps;5 seconds    Bent Knee Raise 20 reps      Neck Exercises: Stretches   Upper Trapezius Stretch Right;Left;5 reps;10 seconds  PT Short Term Goals - 02/17/21 1202      PT SHORT TERM GOAL #1   Title Patient will report at least 25% improvement in symptoms for improved quality of life.    Time 3    Period Weeks    Status New    Target Date 03/10/21      PT SHORT TERM GOAL #2   Title Patient will demo full cervical rotation with pain not exceeding 3/10    Baseline 25% limited, 7/10 pain with rotation right > left    Time 3    Period Weeks    Status New    Target Date 03/10/21      PT SHORT TERM GOAL #3   Title Demo full, pain-free lumbar flexion ROM    Baseline 10% limited, 5/10 pain    Time 3    Period Weeks    Status New    Target Date 03/10/21      PT SHORT TERM GOAL #4   Title Patient will be independent with HEP in order to improve functional outcomes.    Time 3    Period Weeks    Status New    Target Date 03/10/21             PT Long Term Goals - 02/17/21 1204      PT LONG TERM GOAL #1   Title Demo full, pain-free cervical ROM to improve  comfort and safety with driving    Baseline 3/74 pain    Time 6    Period Weeks    Status New    Target Date 03/31/21      PT LONG TERM GOAL #2   Title Demo trunk strength of 3+/5 to improve lumbar stability    Baseline 2+/5    Time 6    Period Weeks    Status New    Target Date 03/31/21      PT LONG TERM GOAL #3   Title Patient will improve on FOTO score to meet predicted outcomes to improve functional independence    Baseline 45% function    Time 6    Period Weeks    Status New    Target Date 03/31/21                 Plan - 02/19/21 1054    Clinical Impression Statement Patient tolerated session well today. Reviewed goals and progressed core and postural strengthening on mat. Patient required verbal cues and demo for proper form with SNAG mobilizations. Patient educated purpose and function of all added exercise. Patient noted slight discomfort in RT shoulder with supine scapular retraction, improved somewhat with cues for form. Patient will continue to benefit from skilled therapy services to reduce deficits and improve functional ability.    Personal Factors and Comorbidities Comorbidity 1;Time since onset of injury/illness/exacerbation;Fitness;Profession    Comorbidities obesity,    Examination-Activity Limitations Lift;Stand;Squat;Sit;Reach Overhead;Locomotion Level;Bend;Carry    Examination-Participation Restrictions Cleaning;Community Activity;Driving;Yard Work;Occupation    Stability/Clinical Decision Making Stable/Uncomplicated    Rehab Potential Good    PT Frequency 2x / week    PT Duration 6 weeks    PT Treatment/Interventions ADLs/Self Care Home Management;Electrical Stimulation;Traction;Gait training;Stair training;Functional mobility training;Therapeutic activities;Therapeutic exercise;Balance training;Neuromuscular re-education;Manual techniques;Passive range of motion;Dry needling;Joint Manipulations;Spinal Manipulations    PT Next Visit Plan Continue with  cervical ROM, stretching, and lumbar strength/stretch. Try bridge    PT Home Exercise Plan cervical SNAG, LTR, PPT 5/11 supine chin tuck, scapular retraction, ab march  Consulted and Agree with Plan of Care Patient           Patient will benefit from skilled therapeutic intervention in order to improve the following deficits and impairments:  Decreased activity tolerance,Decreased range of motion,Decreased strength,Increased muscle spasms,Impaired perceived functional ability,Postural dysfunction,Improper body mechanics,Pain,Obesity  Visit Diagnosis: Acute bilateral low back pain without sciatica  Neck pain  Muscle weakness (generalized)     Problem List Patient Active Problem List   Diagnosis Date Noted  . Iron deficiency anemia 12/13/2020  . Menopausal symptoms 03/14/2020  . Encounter for well woman exam with routine gynecological exam 03/14/2020  . Screening for colorectal cancer 03/14/2020  . BMI 38.0-38.9,adult 03/14/2020  . History of gastric surgery 05/10/2019  . Class 2 obesity 06/25/2017  . Peri-menopausal 09/12/2015  . GERD (gastroesophageal reflux disease) 09/11/2015  . Heel spur 04/17/2015  . Right knee pain 01/15/2014  . Diabetes mellitus (HCC) 07/12/2013  . Essential hypertension, benign 05/30/2012   10:56 AM, 02/19/21 Georges Lynch PT DPT  Physical Therapist with [facility]  [facility]  [phone]   [facility] [address] [place], [place], [address] Phone: [phone]   Fax:  [phone]  Name: AIREONNA BAUER MRN: [id number] Date of Birth: 08-Jan-1971

## 2021-02-24 ENCOUNTER — Other Ambulatory Visit: Payer: Self-pay

## 2021-02-24 ENCOUNTER — Ambulatory Visit (HOSPITAL_COMMUNITY): Payer: 59 | Admitting: Physical Therapy

## 2021-02-24 DIAGNOSIS — M545 Low back pain, unspecified: Secondary | ICD-10-CM

## 2021-02-24 DIAGNOSIS — M6281 Muscle weakness (generalized): Secondary | ICD-10-CM

## 2021-02-24 DIAGNOSIS — M542 Cervicalgia: Secondary | ICD-10-CM

## 2021-02-24 NOTE — Therapy (Signed)
[facility] [address] [place], [place], [address] Phone: [phone]   Fax:  [phone]  Physical Therapy Treatment  Patient Details  Name: Ariel [name] MRN: [id number] Date of Birth: 1971-03-12 Referring Provider (PT): Valentino Nose, NP   Encounter Date: 02/24/2021   PT End of Session - 02/24/21 1727    Visit Number 3    Number of Visits 12    Date for PT Re-Evaluation 03/31/21    Authorization Type [employer]et[employer] NAP, no auth, VL medical necessity    PT Start Time 1002    PT Stop Time 1048    PT Time Calculation (min) 46 min    Activity Tolerance Patient tolerated treatment well    Behavior During Therapy [facility] for tasks assessed/performed           Past Medical History:  Diagnosis Date  . Boil of buttock 09/12/2015   comes and goes- not at present  . BV (bacterial vaginosis) 08/09/2013   03-05-16 no problems now  . Diabetes mellitus without complication (HCC)    borderline  . Hypertension   . Obesity   . Panic attack 07/2015   wakes up from sleep with panic attack  . Transfusion history    25 yrs ago after childbirth -NVD  . Vaginal discharge 04/15/2016  . Vaginal itching 08/09/2013   no problem now  . Yeast infection 04/15/2016    Past Surgical History:  Procedure Laterality Date  . LAPAROSCOPIC GASTRIC SLEEVE RESECTION N/A 03/17/2016   Procedure: LAPAROSCOPIC GASTRIC SLEEVE RESECTION WITH UPPER ENDOSCOPY;  Surgeon: Ovidio Kin, MD;  [facility]: [facility];  Service: General;  Laterality: N/A;  . TUBAL LIGATION    . UPPER GI ENDOSCOPY  03/17/2016   Procedure: UPPER GI ENDOSCOPY;  Surgeon: Ovidio Kin, MD;  [facility];  Service: General;;  . WISDOM TOOTH EXTRACTION      There were no vitals filed for this visit.   Subjective Assessment - 02/24/21 1009    Subjective pt states she can tell it's already so much better.  Reports 4-5/10.  States she did not even take any pain meds yesterday.    Currently in Pain? Yes     Pain Score 5     Pain Location Neck    Pain Orientation Right    Pain Descriptors / Indicators Aching                             [facility] Adult PT Treatment/Exercise - 02/24/21 0001      Neck Exercises: Seated   Other Seated Exercise cervical and thoracic excursions 5X each      Neck Exercises: Supine   Neck Retraction 10 reps    Other Supine Exercise scapular retraction 10 x 3"      Lumbar Exercises: Stretches   Lower Trunk Rotation 5 reps;10 seconds    Prone on Elbows Stretch 2 reps;60 seconds      Lumbar Exercises: Supine   Ab Set 10 reps;5 seconds    Bent Knee Raise 20 reps    Bridge 10 reps    Straight Leg Raise 10 reps      Lumbar Exercises: Prone   Opposite Arm/Leg Raise Limitations heelsqueeze 10x      Neck Exercises: Stretches   Upper Trapezius Stretch Right;Left;5 reps;10 seconds                    PT Short  Term Goals - 02/17/21 1202      PT SHORT TERM GOAL #1   Title Patient will report at least 25% improvement in symptoms for improved quality of life.    Time 3    Period Weeks    Status New    Target Date 03/10/21      PT SHORT TERM GOAL #2   Title Patient will demo full cervical rotation with pain not exceeding 3/10    Baseline 25% limited, 7/10 pain with rotation right > left    Time 3    Period Weeks    Status New    Target Date 03/10/21      PT SHORT TERM GOAL #3   Title Demo full, pain-free lumbar flexion ROM    Baseline 10% limited, 5/10 pain    Time 3    Period Weeks    Status New    Target Date 03/10/21      PT SHORT TERM GOAL #4   Title Patient will be independent with HEP in order to improve functional outcomes.    Time 3    Period Weeks    Status New    Target Date 03/10/21             PT Long Term Goals - 02/17/21 1204      PT LONG TERM GOAL #1   Title Demo full, pain-free cervical ROM to improve comfort and safety with driving    Baseline 6/38 pain    Time 6    Period Weeks    Status  New    Target Date 03/31/21      PT LONG TERM GOAL #2   Title Demo trunk strength of 3+/5 to improve lumbar stability    Baseline 2+/5    Time 6    Period Weeks    Status New    Target Date 03/31/21      PT LONG TERM GOAL #3   Title Patient will improve on FOTO score to meet predicted outcomes to improve functional independence    Baseline 45% function    Time 6    Period Weeks    Status New    Target Date 03/31/21                 Plan - 02/24/21 1729    Clinical Impression Statement continue with focus on improving core stability and improving overall function.  Pt overall improving with less pain reported each session.  Added bridge and prone heel squeeze to further work on glutes.  Prone lying completed this session with 2 bouts of 1 minute holds.  pt reported no change, good or bad completing this.  Cervical rotation continues to be most challenging for patient as far as cervical motions.    Personal Factors and Comorbidities Comorbidity 1;Time since onset of injury/illness/exacerbation;Fitness;Profession    Comorbidities obesity,    Examination-Activity Limitations Lift;Stand;Squat;Sit;Reach Overhead;Locomotion Level;Bend;Carry    Examination-Participation Restrictions Cleaning;Community Activity;Driving;Yard Work;Occupation    Stability/Clinical Decision Making Stable/Uncomplicated    Rehab Potential Good    PT Frequency 2x / week    PT Duration 6 weeks    PT Treatment/Interventions ADLs/Self Care Home Management;Electrical Stimulation;Traction;Gait training;Stair training;Functional mobility training;Therapeutic activities;Therapeutic exercise;Balance training;Neuromuscular re-education;Manual techniques;Passive range of motion;Dry needling;Joint Manipulations;Spinal Manipulations    PT Next Visit Plan Continue with cervical ROM, stretching, and lumbar strength/stretch.    PT Home Exercise Plan cervical SNAG, LTR, PPT 5/11 supine chin tuck, scapular retraction, ab march  Consulted and Agree with Plan of Care Patient           Patient will benefit from skilled therapeutic intervention in order to improve the following deficits and impairments:  Decreased activity tolerance,Decreased range of motion,Decreased strength,Increased muscle spasms,Impaired perceived functional ability,Postural dysfunction,Improper body mechanics,Pain,Obesity  Visit Diagnosis: Acute bilateral low back pain without sciatica  Neck pain  Muscle weakness (generalized)     Problem List Patient Active Problem List   Diagnosis Date Noted  . Iron deficiency anemia 12/13/2020  . Menopausal symptoms 03/14/2020  . Encounter for well woman exam with routine gynecological exam 03/14/2020  . Screening for colorectal cancer 03/14/2020  . BMI 38.0-38.9,adult 03/14/2020  . History of gastric surgery 05/10/2019  . Class 2 obesity 06/25/2017  . Peri-menopausal 09/12/2015  . GERD (gastroesophageal reflux disease) 09/11/2015  . Heel spur 04/17/2015  . Right knee pain 01/15/2014  . Diabetes mellitus (HCC) 07/12/2013  . Essential hypertension, benign 05/30/2012   Lurena Nida, PTA/CLT [phone]  Lurena Nida 02/24/2021, 5:31 PM  [facility] [address] [place], [place], [address] Phone: [phone]   Fax:  [phone]  Name: Ariel [name] MRN: [id number] Date of Birth: 12/07/1970

## 2021-02-26 ENCOUNTER — Other Ambulatory Visit: Payer: Self-pay

## 2021-02-26 ENCOUNTER — Encounter (HOSPITAL_COMMUNITY): Payer: Self-pay

## 2021-02-26 ENCOUNTER — Ambulatory Visit (HOSPITAL_COMMUNITY): Payer: 59

## 2021-02-26 DIAGNOSIS — M545 Low back pain, unspecified: Secondary | ICD-10-CM

## 2021-02-26 DIAGNOSIS — M6281 Muscle weakness (generalized): Secondary | ICD-10-CM

## 2021-02-26 DIAGNOSIS — M542 Cervicalgia: Secondary | ICD-10-CM

## 2021-02-26 NOTE — Therapy (Signed)
[facility] [address] [place], [place], [address] Phone: [phone]   Fax:  [phone]  Physical Therapy Treatment  Patient Details  Name: Ariel [name] MRN: [id number] Date of Birth: 07-17-1971 Referring Provider (PT): Valentino Nose, NP   Encounter Date: 02/26/2021   PT End of Session - 02/26/21 0954    Visit Number 4    Number of Visits 12    Date for PT Re-Evaluation 03/31/21    Authorization Type Aet[employer] NAP, no auth, VL medical necessity    PT Start Time 0945    PT Stop Time 1030    PT Time Calculation (min) 45 min    Activity Tolerance Patient tolerated treatment well    Behavior During Therapy [facility] for tasks assessed/performed           Past Medical History:  Diagnosis Date  . Boil of buttock 09/12/2015   comes and goes- not at present  . BV (bacterial vaginosis) 08/09/2013   03-05-16 no problems now  . Diabetes mellitus without complication (HCC)    borderline  . Hypertension   . Obesity   . Panic attack 07/2015   wakes up from sleep with panic attack  . Transfusion history    25 yrs ago after childbirth -NVD  . Vaginal discharge 04/15/2016  . Vaginal itching 08/09/2013   no problem now  . Yeast infection 04/15/2016    Past Surgical History:  Procedure Laterality Date  . LAPAROSCOPIC GASTRIC SLEEVE RESECTION N/A 03/17/2016   Procedure: LAPAROSCOPIC GASTRIC SLEEVE RESECTION WITH UPPER ENDOSCOPY;  Surgeon: Ovidio Kin, MD;  Location: [facility];  Service: General;  Laterality: N/A;  . TUBAL LIGATION    . UPPER GI ENDOSCOPY  03/17/2016   Procedure: UPPER GI ENDOSCOPY;  Surgeon: Ovidio Kin, MD;  Location: [facility];  Service: General;;  . WISDOM TOOTH EXTRACTION      There were no vitals filed for this visit.   Subjective Assessment - 02/26/21 0952    Subjective Feeling better and notes mainly soreness in neck and left side of low back which she feels may have been inflamed from her lifting at work    Currently  in Pain? Yes    Pain Score 4     Pain Location Neck    Pain Descriptors / Indicators Aching    Pain Type Acute pain    Pain Score 5    Pain Location Back    Pain Orientation Lower    Pain Descriptors / Indicators Aching    Pain Type Acute pain                             [facility] Adult PT Treatment/Exercise - 02/26/21 0001      Neck Exercises: Seated   Shoulder Shrugs 20 reps    Other Seated Exercise Cervical SNAG for extension and rotation x10 each      Neck Exercises: Supine   Neck Retraction 10 reps      Lumbar Exercises: Stretches   Lower Trunk Rotation 5 reps;10 seconds      Lumbar Exercises: Supine   Bent Knee Raise 20 reps    Bridge 10 reps    Straight Leg Raise 20 reps;2 seconds      Manual Therapy   Manual Therapy Soft tissue mobilization    Manual therapy comments completed separate from other interventions    Soft tissue mobilization STM to left cervical column/scapula  and lumbar paraspinals to reduce spasm and pain, circular pettrissage mainly used                    PT Short Term Goals - 02/17/21 1202      PT SHORT TERM GOAL #1   Title Patient will report at least 25% improvement in symptoms for improved quality of life.    Time 3    Period Weeks    Status New    Target Date 03/10/21      PT SHORT TERM GOAL #2   Title Patient will demo full cervical rotation with pain not exceeding 3/10    Baseline 25% limited, 7/10 pain with rotation right > left    Time 3    Period Weeks    Status New    Target Date 03/10/21      PT SHORT TERM GOAL #3   Title Demo full, pain-free lumbar flexion ROM    Baseline 10% limited, 5/10 pain    Time 3    Period Weeks    Status New    Target Date 03/10/21      PT SHORT TERM GOAL #4   Title Patient will be independent with HEP in order to improve functional outcomes.    Time 3    Period Weeks    Status New    Target Date 03/10/21             PT Long Term Goals - 02/17/21 1204       PT LONG TERM GOAL #1   Title Demo full, pain-free cervical ROM to improve comfort and safety with driving    Baseline 7/98 pain    Time 6    Period Weeks    Status New    Target Date 03/31/21      PT LONG TERM GOAL #2   Title Demo trunk strength of 3+/5 to improve lumbar stability    Baseline 2+/5    Time 6    Period Weeks    Status New    Target Date 03/31/21      PT LONG TERM GOAL #3   Title Patient will improve on FOTO score to meet predicted outcomes to improve functional independence    Baseline 45% function    Time 6    Period Weeks    Status New    Target Date 03/31/21                 Plan - 02/26/21 1031    Clinical Impression Statement Progressing well with core stabilization exercises requiring verbal cues approx 50% of the time for activiation when performing bridge and SLR.  TOlerated STM well with increase in spasm/tenderness in left upper trap area.  Contined POC to improve trunk strength and neck pain    Personal Factors and Comorbidities Comorbidity 1;Time since onset of injury/illness/exacerbation;Fitness;Profession    Comorbidities obesity,    Examination-Activity Limitations Lift;Stand;Squat;Sit;Reach Overhead;Locomotion Level;Bend;Carry    Examination-Participation Restrictions Cleaning;Community Activity;Driving;Yard Work;Occupation    Stability/Clinical Decision Making Stable/Uncomplicated    Rehab Potential Good    PT Frequency 2x / week    PT Duration 6 weeks    PT Treatment/Interventions ADLs/Self Care Home Management;Electrical Stimulation;Traction;Gait training;Stair training;Functional mobility training;Therapeutic activities;Therapeutic exercise;Balance training;Neuromuscular re-education;Manual techniques;Passive range of motion;Dry needling;Joint Manipulations;Spinal Manipulations    PT Next Visit Plan Continue with cervical ROM, stretching, and lumbar strength/stretch.    PT Home Exercise Plan cervical SNAG, LTR, PPT 5/11 supine chin  tuck,  scapular retraction, ab march    Consulted and Agree with Plan of Care Patient           Patient will benefit from skilled therapeutic intervention in order to improve the following deficits and impairments:  Decreased activity tolerance,Decreased range of motion,Decreased strength,Increased muscle spasms,Impaired perceived functional ability,Postural dysfunction,Improper body mechanics,Pain,Obesity  Visit Diagnosis: Acute bilateral low back pain without sciatica  Neck pain  Muscle weakness (generalized)     Problem List Patient Active Problem List   Diagnosis Date Noted  . Iron deficiency anemia 12/13/2020  . Menopausal symptoms 03/14/2020  . Encounter for well woman exam with routine gynecological exam 03/14/2020  . Screening for colorectal cancer 03/14/2020  . BMI 38.0-38.9,adult 03/14/2020  . History of gastric surgery 05/10/2019  . Class 2 obesity 06/25/2017  . Peri-menopausal 09/12/2015  . GERD (gastroesophageal reflux disease) 09/11/2015  . Heel spur 04/17/2015  . Right knee pain 01/15/2014  . Diabetes mellitus (HCC) 07/12/2013  . Essential hypertension, benign 05/30/2012   10:33 AM, 02/26/21 M. Shary Decamp, PT, DPT Physical Therapist-  Office Number: [phone]  [facility] [address] [place], [place], [address] Phone: [phone]   Fax:  [phone]  Name: Ariel [name] MRN: [id number] Date of Birth: 1970/12/16

## 2021-03-03 ENCOUNTER — Other Ambulatory Visit: Payer: Self-pay

## 2021-03-03 ENCOUNTER — Ambulatory Visit (HOSPITAL_COMMUNITY): Payer: 59 | Admitting: Physical Therapy

## 2021-03-03 DIAGNOSIS — M6281 Muscle weakness (generalized): Secondary | ICD-10-CM

## 2021-03-03 DIAGNOSIS — M545 Low back pain, unspecified: Secondary | ICD-10-CM

## 2021-03-03 DIAGNOSIS — M542 Cervicalgia: Secondary | ICD-10-CM

## 2021-03-03 NOTE — Therapy (Signed)
[facility] [address] [place], [place], [address] Phone: [phone]   Fax:  [phone]  Physical Therapy Treatment  Patient Details  Name: Ariel [name] MRN: [id number] Date of Birth: [birth year] Referring Provider (PT): Valentino Nose, NP   Encounter Date: 03/03/2021   PT End of Session - 03/03/21 1255    Visit Number 5    Number of Visits 12    Date for PT Re-Evaluation 03/31/21    Authorization Type A[employer] NAP, no auth, VL medical necessity    PT Start Time 1133    PT Stop Time 1211    PT Time Calculation (min) 38 min    Activity Tolerance Patient tolerated treatment well    Behavior During Therapy [facility] for tasks assessed/performed           Past Medical History:  Diagnosis Date  . Boil of buttock 09/12/2015   comes and goes- not at present  . BV (bacterial vaginosis) 08/09/2013   03-05-16 no problems now  . Diabetes mellitus without complication (HCC)    borderline  . Hypertension   . Obesity   . Panic attack 07/2015   wakes up from sleep with panic attack  . Transfusion history    25 yrs ago after childbirth -NVD  . Vaginal discharge 04/15/2016  . Vaginal itching 08/09/2013   no problem now  . Yeast infection 04/15/2016    Past Surgical History:  Procedure Laterality Date  . LAPAROSCOPIC GASTRIC SLEEVE RESECTION N/A 03/17/2016   Procedure: LAPAROSCOPIC GASTRIC SLEEVE RESECTION WITH UPPER ENDOSCOPY;  Surgeon: Ovidio Kin, MD;  Location: [facility];  Service: General;  Laterality: N/A;  . TUBAL LIGATION    . UPPER GI ENDOSCOPY  03/17/2016   Procedure: UPPER GI ENDOSCOPY;  Surgeon: Ovidio Kin, MD;  Location: [facility];  Service: General;;  . WISDOM TOOTH EXTRACTION      There were no vitals filed for this visit.   Subjective Assessment - 03/03/21 1143    Subjective Pt states her pain is around 5/10 today and more so in her back.    Currently in Pain? Yes    Pain Score 5     Pain Location Back    Pain Orientation  Right    Pain Descriptors / Indicators Aching                             [facility] Adult PT Treatment/Exercise - 03/03/21 0001      Neck Exercises: Seated   Shoulder Shrugs 20 reps    Other Seated Exercise cervical and thoracic excursions 5X each      Neck Exercises: Supine   Neck Retraction 10 reps    Other Supine Exercise scapular retraction 10 x 3"      Manual Therapy   Manual Therapy Soft tissue mobilization    Manual therapy comments completed separate from other interventions    Soft tissue mobilization STM to lumbar paraspinals to reduce spasm and pain                    PT Short Term Goals - 02/17/21 1202      PT SHORT TERM GOAL #1   Title Patient will report at least 25% improvement in symptoms for improved quality of life.    Time 3    Period Weeks    Status New    Target Date 03/10/21  PT SHORT TERM GOAL #2   Title Patient will demo full cervical rotation with pain not exceeding 3/10    Baseline 25% limited, 7/10 pain with rotation right > left    Time 3    Period Weeks    Status New    Target Date 03/10/21      PT SHORT TERM GOAL #3   Title Demo full, pain-free lumbar flexion ROM    Baseline 10% limited, 5/10 pain    Time 3    Period Weeks    Status New    Target Date 03/10/21      PT SHORT TERM GOAL #4   Title Patient will be independent with HEP in order to improve functional outcomes.    Time 3    Period Weeks    Status New    Target Date 03/10/21             PT Long Term Goals - 02/17/21 1204      PT LONG TERM GOAL #1   Title Demo full, pain-free cervical ROM to improve comfort and safety with driving    Baseline 2/09 pain    Time 6    Period Weeks    Status New    Target Date 03/31/21      PT LONG TERM GOAL #2   Title Demo trunk strength of 3+/5 to improve lumbar stability    Baseline 2+/5    Time 6    Period Weeks    Status New    Target Date 03/31/21      PT LONG TERM GOAL #3   Title Patient  will improve on FOTO score to meet predicted outcomes to improve functional independence    Baseline 45% function    Time 6    Period Weeks    Status New    Target Date 03/31/21                 Plan - 03/03/21 1300    Clinical Impression Statement continued with focus on improving core stab.  Pt with less cues needed this visit.  Prone manual completed with tight mm lower Lt lumbar paraspinals and into glute.  No neck issues and good ROM achieved today with exercises.    Personal Factors and Comorbidities Comorbidity 1;Time since onset of injury/illness/exacerbation;Fitness;Profession    Comorbidities obesity,    Examination-Activity Limitations Lift;Stand;Squat;Sit;Reach Overhead;Locomotion Level;Bend;Carry    Examination-Participation Restrictions Cleaning;Community Activity;Driving;Yard Work;Occupation    Stability/Clinical Decision Making Stable/Uncomplicated    Rehab Potential Good    PT Frequency 2x / week    PT Duration 6 weeks    PT Treatment/Interventions ADLs/Self Care Home Management;Electrical Stimulation;Traction;Gait training;Stair training;Functional mobility training;Therapeutic activities;Therapeutic exercise;Balance training;Neuromuscular re-education;Manual techniques;Passive range of motion;Dry needling;Joint Manipulations;Spinal Manipulations    PT Next Visit Plan Continue with cervical ROM, stretching, and lumbar strength/stretch.    PT Home Exercise Plan cervical SNAG, LTR, PPT 5/11 supine chin tuck, scapular retraction, ab march    Consulted and Agree with Plan of Care Patient           Patient will benefit from skilled therapeutic intervention in order to improve the following deficits and impairments:  Decreased activity tolerance,Decreased range of motion,Decreased strength,Increased muscle spasms,Impaired perceived functional ability,Postural dysfunction,Improper body mechanics,Pain,Obesity  Visit Diagnosis: Acute bilateral low back pain without  sciatica  Neck pain  Muscle weakness (generalized)     Problem List Patient Active Problem List   Diagnosis Date Noted  . Iron deficiency anemia 12/13/2020  .  Menopausal symptoms 03/14/2020  . Encounter for well woman exam with routine gynecological exam 03/14/2020  . Screening for colorectal cancer 03/14/2020  . BMI 38.0-38.9,adult 03/14/2020  . History of gastric surgery 05/10/2019  . Class 2 obesity 06/25/2017  . Peri-menopausal 09/12/2015  . GERD (gastroesophageal reflux disease) 09/11/2015  . Heel spur 04/17/2015  . Right knee pain 01/15/2014  . Diabetes mellitus (HCC) 07/12/2013  . Essential hypertension, benign 05/30/2012   Lurena Nida, PTA/CLT [phone]  Lurena Nida 03/03/2021, 1:05 PM  [facility] [address] [place], [place], [address] Phone: [phone]   Fax:  [phone]  Name: DLYNN RANES MRN: [id number] Date of Birth: 03/04/1971

## 2021-03-05 ENCOUNTER — Other Ambulatory Visit: Payer: Self-pay

## 2021-03-05 ENCOUNTER — Encounter (HOSPITAL_COMMUNITY): Payer: Self-pay

## 2021-03-05 ENCOUNTER — Ambulatory Visit (HOSPITAL_COMMUNITY): Payer: 59

## 2021-03-05 DIAGNOSIS — M542 Cervicalgia: Secondary | ICD-10-CM

## 2021-03-05 DIAGNOSIS — M545 Low back pain, unspecified: Secondary | ICD-10-CM

## 2021-03-05 DIAGNOSIS — M6281 Muscle weakness (generalized): Secondary | ICD-10-CM

## 2021-03-05 NOTE — Therapy (Signed)
[place] [facility] [address] [place], [place], [address] Phone: [phone]   Fax:  [phone]  Physical Therapy Treatment  Patient Details  Name: Ariel [name] MRN: [id number] Date of Birth: 1971/01/13 Referring Provider (PT): Valentino Nose, NP   Encounter Date: 03/05/2021   PT End of Session - 03/05/21 1004    Visit Number 6    Number of Visits 12    Date for PT Re-Evaluation 03/31/21    Authorization Type [employer] NAP, no auth, VL medical necessity    PT Start Time 0945    PT Stop Time 1030    PT Time Calculation (min) 45 min    Activity Tolerance Patient tolerated treatment well    Behavior During Therapy [facility] for tasks assessed/performed           Past Medical History:  Diagnosis Date  . Boil of buttock 09/12/2015   comes and goes- not at present  . BV (bacterial vaginosis) 08/09/2013   03-05-16 no problems now  . Diabetes mellitus without complication (HCC)    borderline  . Hypertension   . Obesity   . Panic attack 07/2015   wakes up from sleep with panic attack  . Transfusion history    25 yrs ago after childbirth -NVD  . Vaginal discharge 04/15/2016  . Vaginal itching 08/09/2013   no problem now  . Yeast infection 04/15/2016    Past Surgical History:  Procedure Laterality Date  . LAPAROSCOPIC GASTRIC SLEEVE RESECTION N/A 03/17/2016   Procedure: LAPAROSCOPIC GASTRIC SLEEVE RESECTION WITH UPPER ENDOSCOPY;  Surgeon: Ovidio Kin, MD;  Location: [facility];  Service: General;  Laterality: N/A;  . TUBAL LIGATION    . UPPER GI ENDOSCOPY  03/17/2016   Procedure: UPPER GI ENDOSCOPY;  Surgeon: Ovidio Kin, MD;  Location: [facility];  Service: General;;  . WISDOM TOOTH EXTRACTION      There were no vitals filed for this visit.   Subjective Assessment - 03/05/21 1003    Subjective Feels like my neck is getting better, main issue is the back pain which still bothers me at work    Currently in Pain? Yes    Pain Score 4     Pain  Location Back    Pain Orientation Right    Pain Descriptors / Indicators Aching              [facility] PT Assessment - 03/05/21 0001      Assessment   Medical Diagnosis neck and low back pain    Referring Provider (PT) Valentino Nose, NP    Onset Date/Surgical Date 01/20/21                         [facility] Adult PT Treatment/Exercise - 03/05/21 0001      Neck Exercises: Seated   Other Seated Exercise lateral flexion stretch for right c-spine      Lumbar Exercises: Supine   Bent Knee Raise 15 reps;3 seconds      Lumbar Exercises: Prone   Other Prone Lumbar Exercises prone lying x 5 min      Lumbar Exercises: Quadruped   Madcat/Old Horse 15 reps    Other Quadruped Lumbar Exercises child's pose 2 x 60 sec      Manual Therapy   Manual Therapy Soft tissue mobilization    Manual therapy comments completed separate from other interventions    Soft tissue mobilization STM to lumbar paraspinals to reduce  spasm and pain, and use of percussion gun to promote relaxation and reduce guarding, good response                    PT Short Term Goals - 02/17/21 1202      PT SHORT TERM GOAL #1   Title Patient will report at least 25% improvement in symptoms for improved quality of life.    Time 3    Period Weeks    Status New    Target Date 03/10/21      PT SHORT TERM GOAL #2   Title Patient will demo full cervical rotation with pain not exceeding 3/10    Baseline 25% limited, 7/10 pain with rotation right > left    Time 3    Period Weeks    Status New    Target Date 03/10/21      PT SHORT TERM GOAL #3   Title Demo full, pain-free lumbar flexion ROM    Baseline 10% limited, 5/10 pain    Time 3    Period Weeks    Status New    Target Date 03/10/21      PT SHORT TERM GOAL #4   Title Patient will be independent with HEP in order to improve functional outcomes.    Time 3    Period Weeks    Status New    Target Date 03/10/21             PT Long  Term Goals - 02/17/21 1204      PT LONG TERM GOAL #1   Title Demo full, pain-free cervical ROM to improve comfort and safety with driving    Baseline 9/62 pain    Time 6    Period Weeks    Status New    Target Date 03/31/21      PT LONG TERM GOAL #2   Title Demo trunk strength of 3+/5 to improve lumbar stability    Baseline 2+/5    Time 6    Period Weeks    Status New    Target Date 03/31/21      PT LONG TERM GOAL #3   Title Patient will improve on FOTO score to meet predicted outcomes to improve functional independence    Baseline 45% function    Time 6    Period Weeks    Status New    Target Date 03/31/21                 Plan - 03/05/21 1016    Clinical Impression Statement Tx plan continued with lumbar stretching/mobilization via yoga positions to decrease paraspinal tenderness and tolerating very well and progressing along with lumbar stabilization exercises and will require contiuned PT sessions to focus on lifting with stabilization via core and glute recruitment. Continues to exhibit tenderness to palpation right cervical column and limited left rotation and sidebending due to tightness along right c-spine with cues for relaxing upper trap during static stretching    Personal Factors and Comorbidities Comorbidity 1;Time since onset of injury/illness/exacerbation;Fitness;Profession    Comorbidities obesity,    Examination-Activity Limitations Lift;Stand;Squat;Sit;Reach Overhead;Locomotion Level;Bend;Carry    Examination-Participation Restrictions Cleaning;Community Activity;Driving;Yard Work;Occupation    Stability/Clinical Decision Making Stable/Uncomplicated    Rehab Potential Good    PT Frequency 2x / week    PT Duration 6 weeks    PT Treatment/Interventions ADLs/Self Care Home Management;Electrical Stimulation;Traction;Gait training;Stair training;Functional mobility training;Therapeutic activities;Therapeutic exercise;Balance training;Neuromuscular  re-education;Manual techniques;Passive range of motion;Dry needling;Joint Manipulations;Spinal  Manipulations    PT Next Visit Plan Continue with cervical ROM, stretching, and lumbar strength/stretch. Functional lifts with core stabilization    PT Home Exercise Plan cervical SNAG, LTR, PPT 5/11 supine chin tuck, scapular retraction, ab march, child's pose, cat-cow    Consulted and Agree with Plan of Care Patient           Patient will benefit from skilled therapeutic intervention in order to improve the following deficits and impairments:  Decreased activity tolerance,Decreased range of motion,Decreased strength,Increased muscle spasms,Impaired perceived functional ability,Postural dysfunction,Improper body mechanics,Pain,Obesity  Visit Diagnosis: Acute bilateral low back pain without sciatica  Neck pain  Muscle weakness (generalized)     Problem List Patient Active Problem List   Diagnosis Date Noted  . Iron deficiency anemia 12/13/2020  . Menopausal symptoms 03/14/2020  . Encounter for well woman exam with routine gynecological exam 03/14/2020  . Screening for colorectal cancer 03/14/2020  . BMI 38.0-38.9,adult 03/14/2020  . History of gastric surgery 05/10/2019  . Class 2 obesity 06/25/2017  . Peri-menopausal 09/12/2015  . GERD (gastroesophageal reflux disease) 09/11/2015  . Heel spur 04/17/2015  . Right knee pain 01/15/2014  . Diabetes mellitus (HCC) 07/12/2013  . Essential hypertension, benign 05/30/2012   10:28 AM, 03/05/21 M. Shary Decamp, PT, DPT Physical Therapist- [place] Office Number: [phone]  [facility] [address] [place], [place], [address] Phone: [phone]   Fax:  [phone]  Name: Ariel [name] MRN: [id number] Date of Birth: 1970/11/03

## 2021-03-06 ENCOUNTER — Ambulatory Visit: Payer: 59 | Admitting: Podiatry

## 2021-03-06 ENCOUNTER — Ambulatory Visit (INDEPENDENT_AMBULATORY_CARE_PROVIDER_SITE_OTHER): Payer: 59

## 2021-03-06 DIAGNOSIS — M722 Plantar fascial fibromatosis: Secondary | ICD-10-CM

## 2021-03-06 DIAGNOSIS — M7672 Peroneal tendinitis, left leg: Secondary | ICD-10-CM

## 2021-03-11 ENCOUNTER — Encounter: Payer: Self-pay | Admitting: Podiatry

## 2021-03-11 NOTE — Progress Notes (Signed)
Subjective:  Patient ID: Ariel [name], female    DOB: September 08, 1971,  MRN: [id number]  Chief Complaint  Patient presents with  . Foot Pain    Left foot pain     50 y.o. female presents with the above complaint.  Patient presents with complaint of left lateral foot pain.  Patient states this is a new complaint.  Her plantar fascial is doing well.  She would like to discuss treatment options.  She states it she can work with the boot discussed is very painful at night.  Is mostly happening at night is dull achy in nature and happens on the outside of the foot.  She denies any other acute complaints.  She states is 8 out of 10 and is dull achy in nature   Review of Systems: Negative except as noted in the HPI. Denies N/V/F/Ch.  Past Medical History:  Diagnosis Date  . Boil of buttock 09/12/2015   comes and goes- not at present  . BV (bacterial vaginosis) 08/09/2013   03-05-16 no problems now  . Diabetes mellitus without complication (HCC)    borderline  . Hypertension   . Obesity   . Panic attack 07/2015   wakes up from sleep with panic attack  . Transfusion history    25 yrs ago after childbirth -NVD  . Vaginal discharge 04/15/2016  . Vaginal itching 08/09/2013   no problem now  . Yeast infection 04/15/2016    Current Outpatient Medications:  .  acetaminophen (TYLENOL) 325 MG tablet, Take 650 mg by mouth every 6 (six) hours as needed (For pain.)., Disp: , Rfl:  .  Calcium Carbonate-Vitamin D (CALCIUM 600/VITAMIN D) 600-400 MG-UNIT chew tablet, Chew 2 tablets by mouth daily., Disp: 60 tablet, Rfl: 0 .  Cyanocobalamin (VITAMIN B-12) 500 MCG SUBL, Place 1 tablet (500 mcg total) under the tongue daily., Disp: 150 tablet, Rfl:  .  diclofenac Sodium (VOLTAREN) 1 % GEL, Apply 2 g topically 4 (four) times daily., Disp: 2 g, Rfl: 2 .  esomeprazole (NEXIUM) 20 MG capsule, Take 20 mg by mouth daily as needed., Disp: , Rfl:  .  ferrous sulfate (SM IRON SLOW RELEASE) 160 (50 Fe) MG TBCR SR  tablet, Take 1 tablet (160 mg total) by mouth daily., Disp: 30 tablet, Rfl: 2 .  fluticasone (FLONASE) 50 MCG/ACT nasal spray, INSTILL 2 SPRAYS INTO EACH NOSTRIL DAILY., Disp: 16 g, Rfl: 0 .  hydrochlorothiazide (MICROZIDE) 12.5 MG capsule, Take 1 capsule (12.5 mg total) by mouth daily., Disp: 90 capsule, Rfl: 1 .  methocarbamol (ROBAXIN) 500 MG tablet, Take 1 tablet (500 mg total) by mouth 3 (three) times daily., Disp: 21 tablet, Rfl: 0 .  Multiple Vitamin (MULTIVITAMIN WITH MINERALS) TABS tablet, Take 1 tablet by mouth daily., Disp: , Rfl:  .  Semaglutide, 1 MG/DOSE, 4 MG/3ML SOPN, Inject 1 mg into the skin once a week., Disp: 3 mL, Rfl: 2  Social History   Tobacco Use  Smoking Status Never Smoker  Smokeless Tobacco Never Used    No Known Allergies Objective:  There were no vitals filed for this visit. There is no height or weight on file to calculate BMI. Constitutional Well developed. Well nourished.  Vascular Dorsalis pedis pulses palpable bilaterally. Posterior tibial pulses palpable bilaterally. Capillary refill normal to all digits.  No cyanosis or clubbing noted. Pedal hair growth normal.  Neurologic Normal speech. Oriented to person, place, and time. Epicritic sensation to light touch grossly present bilaterally.  Dermatologic Nails well groomed  and normal in appearance. No open wounds. No skin lesions.  Orthopedic:  Pain on palpation along the course of the peroneal tendon including its insertion of the fifth metatarsal base.  Pain with resisted dorsiflexion eversion of the foot.  No pain with plantarflexion inversion of the foot.  No pain at the ATFL ligament, Achilles tendon, posterior tibial tendon.   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  Plantar and posterior heel spurring noted.  No other bony abnormalities identified. Assessment:   1. Peroneal  tendinitis of left lower extremity    Plan:  Patient was evaluated and treated and all questions answered.  Left peroneal tendinitis -I explained to the patient the etiology of tendinitis and pressure but options were extensively discussed.  Given the amount of pain that she is having she should ideally be in a boot however she is not able to take the time off and her work will not allow her to wear the boot.  At this time I believe patient may benefit from a Tri-Lock ankle brace and if her pain continues to get worse then I discussed with her that she will have to go in the boot and take the time off to work.  She states understanding.  She would like to try the Tri-Lock ankle brace first -Tri-Lock ankle brace was dispensed  No follow-ups on file.

## 2021-03-12 ENCOUNTER — Encounter (HOSPITAL_COMMUNITY): Payer: Self-pay

## 2021-03-12 ENCOUNTER — Ambulatory Visit (HOSPITAL_COMMUNITY): Payer: 59 | Attending: Nurse Practitioner

## 2021-03-12 ENCOUNTER — Other Ambulatory Visit: Payer: Self-pay

## 2021-03-12 DIAGNOSIS — M542 Cervicalgia: Secondary | ICD-10-CM | POA: Diagnosis present

## 2021-03-12 DIAGNOSIS — M545 Low back pain, unspecified: Secondary | ICD-10-CM | POA: Diagnosis present

## 2021-03-12 DIAGNOSIS — M6281 Muscle weakness (generalized): Secondary | ICD-10-CM

## 2021-03-12 NOTE — Therapy (Signed)
[facility] [address] [place], [place], [address] Phone: [phone]   Fax:  [phone]  Physical Therapy Treatment  Patient Details  Name: Ariel [name] MRN: [id number] Date of Birth: 07-12-1971 Referring Provider (PT): Valentino Nose, NP   Encounter Date: 03/12/2021   PT End of Session - 03/12/21 1010    Visit Number 7    Number of Visits 12    Date for PT Re-Evaluation 03/31/21    Authorization Type Aet[employer] NAP, no auth, VL medical necessity    PT Start Time 1003    PT Stop Time 1045    PT Time Calculation (min) 42 min    Activity Tolerance Patient tolerated treatment well;Patient limited by pain;No increased pain    Behavior During Therapy WFL for tasks assessed/performed           Past Medical History:  Diagnosis Date  . Boil of buttock 09/12/2015   comes and goes- not at present  . BV (bacterial vaginosis) 08/09/2013   03-05-16 no problems now  . Diabetes mellitus without complication (HCC)    borderline  . Hypertension   . Obesity   . Panic attack 07/2015   wakes up from sleep with panic attack  . Transfusion history    25 yrs ago after childbirth -NVD  . Vaginal discharge 04/15/2016  . Vaginal itching 08/09/2013   no problem now  . Yeast infection 04/15/2016    Past Surgical History:  Procedure Laterality Date  . LAPAROSCOPIC GASTRIC SLEEVE RESECTION N/A 03/17/2016   Procedure: LAPAROSCOPIC GASTRIC SLEEVE RESECTION WITH UPPER ENDOSCOPY;  Surgeon: Ovidio Kin, MD;  Location: [facility];  Service: General;  Laterality: N/A;  . TUBAL LIGATION    . UPPER GI ENDOSCOPY  03/17/2016   Procedure: UPPER GI ENDOSCOPY;  Surgeon: Ovidio Kin, MD;  Location: [facility];  Service: General;;  . WISDOM TOOTH EXTRACTION      There were no vitals filed for this visit.   Subjective Assessment - 03/12/21 1007    Subjective Pt stated she returned to work yesterday and reports increased back pain, pain scale 6/10.  Reports improvements  with her head movements, no longer using whole body twist to respond to others.    Patient Stated Goals "Get out of pain"    Currently in Pain? Yes    Pain Score 6     Pain Location Back    Pain Orientation Lower;Left    Pain Descriptors / Indicators Aching;Sore    Pain Type Acute pain    Pain Frequency Intermittent    Aggravating Factors  bending, turning in the car, overhead lifting    Pain Relieving Factors heat, standing    Effect of Pain on Daily Activities limits                             [facility] Adult PT Treatment/Exercise - 03/12/21 0001      Neck Exercises: Seated   Other Seated Exercise 3D cervical rotation 5x each      Lumbar Exercises: Stretches   Prone on Elbows Stretch Limitations 3 min with additional deep breathing      Lumbar Exercises: Standing   Lifting From floor;to overhead;5 reps    Lifting Weights (lbs) 8      Lumbar Exercises: Quadruped   Madcat/Old Horse 15 reps    Madcat/Old Horse Limitations tactile/verbal cueing for mechanics    Other Quadruped Lumbar Exercises quadruped  rotation, DC'd due to increased neck pain      Manual Therapy   Manual Therapy Soft tissue mobilization    Manual therapy comments completed separate from other interventions    Soft tissue mobilization STM to lumbar region for pain control                    PT Short Term Goals - 02/17/21 1202      PT SHORT TERM GOAL #1   Title Patient will report at least 25% improvement in symptoms for improved quality of life.    Time 3    Period Weeks    Status New    Target Date 03/10/21      PT SHORT TERM GOAL #2   Title Patient will demo full cervical rotation with pain not exceeding 3/10    Baseline 25% limited, 7/10 pain with rotation right > left    Time 3    Period Weeks    Status New    Target Date 03/10/21      PT SHORT TERM GOAL #3   Title Demo full, pain-free lumbar flexion ROM    Baseline 10% limited, 5/10 pain    Time 3    Period  Weeks    Status New    Target Date 03/10/21      PT SHORT TERM GOAL #4   Title Patient will be independent with HEP in order to improve functional outcomes.    Time 3    Period Weeks    Status New    Target Date 03/10/21             PT Long Term Goals - 02/17/21 1204      PT LONG TERM GOAL #1   Title Demo full, pain-free cervical ROM to improve comfort and safety with driving    Baseline 3/84 pain    Time 6    Period Weeks    Status New    Target Date 03/31/21      PT LONG TERM GOAL #2   Title Demo trunk strength of 3+/5 to improve lumbar stability    Baseline 2+/5    Time 6    Period Weeks    Status New    Target Date 03/31/21      PT LONG TERM GOAL #3   Title Patient will improve on FOTO score to meet predicted outcomes to improve functional independence    Baseline 45% function    Time 6    Period Weeks    Status New    Target Date 03/31/21                 Plan - 03/12/21 1124    Clinical Impression Statement Instructed proper lifting following reports of increased LBP following RTW.  Continue spinal mobility stretches and core strengthening exercises with cueing for breathing through all exercises.  EOS wtih manual STM to address tension in QL region wiht reports of some relief following.    Personal Factors and Comorbidities Comorbidity 1;Time since onset of injury/illness/exacerbation;Fitness;Profession    Comorbidities obesity,    Examination-Activity Limitations Lift;Stand;Squat;Sit;Reach Overhead;Locomotion Level;Bend;Carry    Examination-Participation Restrictions Cleaning;Community Activity;Driving;Yard Work;Occupation    Stability/Clinical Decision Making Stable/Uncomplicated    Clinical Decision Making Low    Rehab Potential Good    PT Frequency 2x / week    PT Duration 6 weeks    PT Treatment/Interventions ADLs/Self Care Home Management;Electrical Stimulation;Traction;Gait training;Stair training;Functional mobility training;Therapeutic  activities;Therapeutic exercise;Balance  training;Neuromuscular re-education;Manual techniques;Passive range of motion;Dry needling;Joint Manipulations;Spinal Manipulations    PT Next Visit Plan Continue with cervical ROM, stretching, and lumbar strength/stretch. Functional lifts with core stabilization    PT Home Exercise Plan cervical SNAG, LTR, PPT 5/11 supine chin tuck, scapular retraction, ab march, child's pose, cat-cow    Consulted and Agree with Plan of Care Patient           Patient will benefit from skilled therapeutic intervention in order to improve the following deficits and impairments:  Decreased activity tolerance,Decreased range of motion,Decreased strength,Increased muscle spasms,Impaired perceived functional ability,Postural dysfunction,Improper body mechanics,Pain,Obesity  Visit Diagnosis: Muscle weakness (generalized)  Acute bilateral low back pain without sciatica  Neck pain     Problem List Patient Active Problem List   Diagnosis Date Noted  . Iron deficiency anemia 12/13/2020  . Menopausal symptoms 03/14/2020  . Encounter for well woman exam with routine gynecological exam 03/14/2020  . Screening for colorectal cancer 03/14/2020  . BMI 38.0-38.9,adult 03/14/2020  . History of gastric surgery 05/10/2019  . Class 2 obesity 06/25/2017  . Peri-menopausal 09/12/2015  . GERD (gastroesophageal reflux disease) 09/11/2015  . Heel spur 04/17/2015  . Right knee pain 01/15/2014  . Diabetes mellitus (HCC) 07/12/2013  . Essential hypertension, benign 05/30/2012   Becky Sax, LPTA/CLT; CBIS [phone]  Juel Burrow 03/12/2021, 11:29 AM  [facility] [address] [place], [place], [address] Phone: [phone]   Fax:  [phone]  Name: Ariel [name] MRN: [id number] Date of Birth: 09/23/1971

## 2021-03-13 ENCOUNTER — Ambulatory Visit (HOSPITAL_COMMUNITY): Payer: 59 | Admitting: Physical Therapy

## 2021-03-13 DIAGNOSIS — M542 Cervicalgia: Secondary | ICD-10-CM

## 2021-03-13 DIAGNOSIS — M6281 Muscle weakness (generalized): Secondary | ICD-10-CM

## 2021-03-13 DIAGNOSIS — M545 Low back pain, unspecified: Secondary | ICD-10-CM

## 2021-03-13 NOTE — Therapy (Signed)
[facility] [address] [place], [place], [address] Phone: [phone]   Fax:  [phone]  Physical Therapy Treatment  Patient Details  Name: Ariel [name] MRN: [id number] Date of Birth: 1971-05-17 Referring Provider (PT): Valentino Nose, NP   Encounter Date: 03/13/2021   PT End of Session - 03/13/21 1156    Visit Number 8    Number of Visits 12    Date for PT Re-Evaluation 03/31/21    Authorization Type [employer] NAP, no auth, VL medical necessity    PT Start Time 1008    PT Stop Time 1047    PT Time Calculation (min) 39 min    Activity Tolerance Patient tolerated treatment well;Patient limited by pain;No increased pain    Behavior During Therapy WFL for tasks assessed/performed           Past Medical History:  Diagnosis Date  . Boil of buttock 09/12/2015   comes and goes- not at present  . BV (bacterial vaginosis) 08/09/2013   03-05-16 no problems now  . Diabetes mellitus without complication (HCC)    borderline  . Hypertension   . Obesity   . Panic attack 07/2015   wakes up from sleep with panic attack  . Transfusion history    25 yrs ago after childbirth -NVD  . Vaginal discharge 04/15/2016  . Vaginal itching 08/09/2013   no problem now  . Yeast infection 04/15/2016    Past Surgical History:  Procedure Laterality Date  . LAPAROSCOPIC GASTRIC SLEEVE RESECTION N/A 03/17/2016   Procedure: LAPAROSCOPIC GASTRIC SLEEVE RESECTION WITH UPPER ENDOSCOPY;  Surgeon: Ovidio Kin, MD;  Location: [facility];  Service: General;  Laterality: N/A;  . TUBAL LIGATION    . UPPER GI ENDOSCOPY  03/17/2016   Procedure: UPPER GI ENDOSCOPY;  Surgeon: Ovidio Kin, MD;  Location: [facility];  Service: General;;  . WISDOM TOOTH EXTRACTION      There were no vitals filed for this visit.   Subjective Assessment - 03/13/21 1016    Subjective pt reports 5/10 pain in LB; not much in her neck, however states she continues to be stiff    Currently in  Pain? Yes    Pain Score 5     Pain Location Back    Pain Orientation Left    Pain Descriptors / Indicators Aching;Sore    Pain Type Acute pain                             [facility] Adult PT Treatment/Exercise - 03/13/21 0001      Neck Exercises: Seated   Other Seated Exercise 3D cervical rotation 5x each      Lumbar Exercises: Stretches   Prone on Elbows Stretch Limitations 3 min with additional deep breathing      Lumbar Exercises: Standing   Lifting From floor;to overhead;5 reps    Lifting Weights (lbs) 8    Lifting Limitations other scenarios OH, twisting and carrying items    Scapular Retraction Both;10 reps    Theraband Level (Scapular Retraction) Level 4 (Blue)    Row Both;10 reps    Theraband Level (Row) Level 4 (Blue)    Shoulder Extension Both;10 reps    Theraband Level (Shoulder Extension) Level 4 (Blue)      Lumbar Exercises: Quadruped   Madcat/Old Horse 15 reps    Madcat/Old Horse Limitations tactile/verbal cueing for mechanics    Straight Leg Raise 10 reps  Straight Leg Raises Limitations core stab    Opposite Arm/Leg Raise 10 reps    Opposite Arm/Leg Raise Limitations core stab      Manual Therapy   Manual Therapy Soft tissue mobilization    Manual therapy comments completed separate from other interventions    Soft tissue mobilization STM to lumbar region for pain control                    PT Short Term Goals - 02/17/21 1202      PT SHORT TERM GOAL #1   Title Patient will report at least 25% improvement in symptoms for improved quality of life.    Time 3    Period Weeks    Status New    Target Date 03/10/21      PT SHORT TERM GOAL #2   Title Patient will demo full cervical rotation with pain not exceeding 3/10    Baseline 25% limited, 7/10 pain with rotation right > left    Time 3    Period Weeks    Status New    Target Date 03/10/21      PT SHORT TERM GOAL #3   Title Demo full, pain-free lumbar flexion ROM     Baseline 10% limited, 5/10 pain    Time 3    Period Weeks    Status New    Target Date 03/10/21      PT SHORT TERM GOAL #4   Title Patient will be independent with HEP in order to improve functional outcomes.    Time 3    Period Weeks    Status New    Target Date 03/10/21             PT Long Term Goals - 02/17/21 1204      PT LONG TERM GOAL #1   Title Demo full, pain-free cervical ROM to improve comfort and safety with driving    Baseline 5/36 pain    Time 6    Period Weeks    Status New    Target Date 03/31/21      PT LONG TERM GOAL #2   Title Demo trunk strength of 3+/5 to improve lumbar stability    Baseline 2+/5    Time 6    Period Weeks    Status New    Target Date 03/31/21      PT LONG TERM GOAL #3   Title Patient will improve on FOTO score to meet predicted outcomes to improve functional independence    Baseline 45% function    Time 6    Period Weeks    Status New    Target Date 03/31/21                 Plan - 03/13/21 1152    Clinical Impression Statement Pt overall improving with more sore/achey symptoms rather than pain in LB.  Believes she is guarding her cervical rotation without being aware.  Reveiwed basic body mechanics again this session including carrying objects and lifting from various heights.  Pt able to demonstrate appropriate body mechanics following demonstration and is aware of the general concepts.  Added UE/thoracic strengthening using theraband with cues needed for form and holds.  Continued with quadruped stretch and added opposite UE/LE for stabilization.  Prone extensions help with reducing symptoms.  manual continued to Lt lumbar area with general tightness palpated but without spasms.  Overall improvement voiced at end of session.    Personal  Factors and Comorbidities Comorbidity 1;Time since onset of injury/illness/exacerbation;Fitness;Profession    Comorbidities obesity,    Examination-Activity Limitations  Lift;Stand;Squat;Sit;Reach Overhead;Locomotion Level;Bend;Carry    Examination-Participation Restrictions Cleaning;Community Activity;Driving;Yard Work;Occupation    Stability/Clinical Decision Making Stable/Uncomplicated    Rehab Potential Good    PT Frequency 2x / week    PT Duration 6 weeks    PT Treatment/Interventions ADLs/Self Care Home Management;Electrical Stimulation;Traction;Gait training;Stair training;Functional mobility training;Therapeutic activities;Therapeutic exercise;Balance training;Neuromuscular re-education;Manual techniques;Passive range of motion;Dry needling;Joint Manipulations;Spinal Manipulations    PT Next Visit Plan Continue with cervical ROM,  lumbar stab/strengthening and body mechanics education.  Continue manual for pain.    PT Home Exercise Plan cervical SNAG, LTR, PPT 5/11 supine chin tuck, scapular retraction, ab march, child's pose, cat-cow    Consulted and Agree with Plan of Care Patient           Patient will benefit from skilled therapeutic intervention in order to improve the following deficits and impairments:  Decreased activity tolerance,Decreased range of motion,Decreased strength,Increased muscle spasms,Impaired perceived functional ability,Postural dysfunction,Improper body mechanics,Pain,Obesity  Visit Diagnosis: Muscle weakness (generalized)  Acute bilateral low back pain without sciatica  Neck pain     Problem List Patient Active Problem List   Diagnosis Date Noted  . Iron deficiency anemia 12/13/2020  . Menopausal symptoms 03/14/2020  . Encounter for well woman exam with routine gynecological exam 03/14/2020  . Screening for colorectal cancer 03/14/2020  . BMI 38.0-38.9,adult 03/14/2020  . History of gastric surgery 05/10/2019  . Class 2 obesity 06/25/2017  . Peri-menopausal 09/12/2015  . GERD (gastroesophageal reflux disease) 09/11/2015  . Heel spur 04/17/2015  . Right knee pain 01/15/2014  . Diabetes mellitus (HCC)  07/12/2013  . Essential hypertension, benign 05/30/2012   Lurena Nida, PTA/CLT [phone]  Lurena Nida 03/13/2021, 11:57 AM  [facility] [address] [place], [place], [address] Phone: [phone]   Fax:  [phone]  Name: MINDI AKERSON MRN: [id number] Date of Birth: 03/22/1971

## 2021-03-17 ENCOUNTER — Other Ambulatory Visit: Payer: Self-pay

## 2021-03-17 ENCOUNTER — Encounter (HOSPITAL_COMMUNITY): Payer: Self-pay

## 2021-03-17 ENCOUNTER — Ambulatory Visit (HOSPITAL_COMMUNITY): Payer: 59

## 2021-03-17 DIAGNOSIS — M542 Cervicalgia: Secondary | ICD-10-CM

## 2021-03-17 DIAGNOSIS — M545 Low back pain, unspecified: Secondary | ICD-10-CM

## 2021-03-17 DIAGNOSIS — M6281 Muscle weakness (generalized): Secondary | ICD-10-CM | POA: Diagnosis not present

## 2021-03-17 NOTE — Therapy (Signed)
[facility] [address] [place], [place], [address] Phone: [phone]   Fax:  [phone]  Physical Therapy Treatment  Patient Details  Name: Ariel [name] MRN: [id number] Date of Birth: [birth year] Referring Provider (PT): Valentino Nose, NP   Encounter Date: 03/17/2021   PT End of Session - 03/17/21 1040    Visit Number 9    Number of Visits 12    Date for PT Re-Evaluation 03/31/21    Authorization Type [employer] NAP, no auth, VL medical necessity    Progress Note Due on Visit 10    PT Start Time 1030    PT Stop Time 1115    PT Time Calculation (min) 45 min    Activity Tolerance Patient tolerated treatment well;Patient limited by pain;No increased pain    Behavior During Therapy WFL for tasks assessed/performed           Past Medical History:  Diagnosis Date  . Boil of buttock 09/12/2015   comes and goes- not at present  . BV (bacterial vaginosis) 08/09/2013   03-05-16 no problems now  . Diabetes mellitus without complication (HCC)    borderline  . Hypertension   . Obesity   . Panic attack 07/2015   wakes up from sleep with panic attack  . Transfusion history    25 yrs ago after childbirth -NVD  . Vaginal discharge 04/15/2016  . Vaginal itching 08/09/2013   no problem now  . Yeast infection 04/15/2016    Past Surgical History:  Procedure Laterality Date  . LAPAROSCOPIC GASTRIC SLEEVE RESECTION N/A 03/17/2016   Procedure: LAPAROSCOPIC GASTRIC SLEEVE RESECTION WITH UPPER ENDOSCOPY;  Surgeon: Ovidio Kin, MD;  Location: [facility];  Service: General;  Laterality: N/A;  . TUBAL LIGATION    . UPPER GI ENDOSCOPY  03/17/2016   Procedure: UPPER GI ENDOSCOPY;  Surgeon: Ovidio Kin, MD;  Location: [facility];  Service: General;;  . WISDOM TOOTH EXTRACTION      There were no vitals filed for this visit.   Subjective Assessment - 03/17/21 1039    Subjective Reports neck feels better and is only somewhat stiff.  LBP continues and notes  she worked 6-days in a row and rates back pain at 5/10    Currently in Pain? Yes    Pain Score 5     Pain Location Back    Pain Orientation Left    Pain Descriptors / Indicators Aching;Sore    Pain Type Acute pain              [facility] PT Assessment - 03/17/21 0001      Assessment   Medical Diagnosis neck and low back pain    Referring Provider (PT) Valentino Nose, NP    Onset Date/Surgical Date 01/20/21                         [facility] Adult PT Treatment/Exercise - 03/17/21 0001      Lumbar Exercises: Stretches   Press Ups 20 reps      Lumbar Exercises: Aerobic   Nustep level 3 x 5 min for dynamic warmup      Lumbar Exercises: Standing   Lifting From floor;15 reps    Lifting Weights (lbs) 16    Lifting Limitations deadlift form    Other Standing Lumbar Exercises overhead carry with 8 lbs dumbell (unilateral) alternating, 25 ft distance x 4 trips      Lumbar Exercises: Seated  Sit to Stand 20 reps   8 lbs goblet squat     Lumbar Exercises: Quadruped   Madcat/Old Horse 20 reps    Madcat/Old Horse Limitations tactile/verbal cueing for mechanics                  PT Education - 03/17/21 1120    Education Details education on tx rationale and benefits of performing compound lifts with dumbells to improve core/LE strength and improve body mechanics    Person(s) Educated Patient    Methods Explanation;Demonstration;Handout    Comprehension Verbalized understanding;Returned demonstration            PT Short Term Goals - 02/17/21 1202      PT SHORT TERM GOAL #1   Title Patient will report at least 25% improvement in symptoms for improved quality of life.    Time 3    Period Weeks    Status New    Target Date 03/10/21      PT SHORT TERM GOAL #2   Title Patient will demo full cervical rotation with pain not exceeding 3/10    Baseline 25% limited, 7/10 pain with rotation right > left    Time 3    Period Weeks    Status New    Target Date  03/10/21      PT SHORT TERM GOAL #3   Title Demo full, pain-free lumbar flexion ROM    Baseline 10% limited, 5/10 pain    Time 3    Period Weeks    Status New    Target Date 03/10/21      PT SHORT TERM GOAL #4   Title Patient will be independent with HEP in order to improve functional outcomes.    Time 3    Period Weeks    Status New    Target Date 03/10/21             PT Long Term Goals - 02/17/21 1204      PT LONG TERM GOAL #1   Title Demo full, pain-free cervical ROM to improve comfort and safety with driving    Baseline 8/29 pain    Time 6    Period Weeks    Status New    Target Date 03/31/21      PT LONG TERM GOAL #2   Title Demo trunk strength of 3+/5 to improve lumbar stability    Baseline 2+/5    Time 6    Period Weeks    Status New    Target Date 03/31/21      PT LONG TERM GOAL #3   Title Patient will improve on FOTO score to meet predicted outcomes to improve functional independence    Baseline 45% function    Time 6    Period Weeks    Status New    Target Date 03/31/21                 Plan - 03/17/21 1122    Clinical Impression Statement Demonstrating improved body mechanics as observed during functional lifts and demonstrating good return demonstration for deadlift and squat techniques with less cues for instrurction in position and execution with improved body control noted during quadruped positioning.  Pt reports she will initiate return to gym for fitness program tomorrow    Personal Factors and Comorbidities Comorbidity 1;Time since onset of injury/illness/exacerbation;Fitness;Profession    Comorbidities obesity,    Examination-Activity Limitations Lift;Stand;Squat;Sit;Reach Overhead;Locomotion Level;Bend;Carry    Examination-Participation Restrictions Cleaning;Community Activity;Driving;Yard Work;Occupation  Stability/Clinical Decision Making Stable/Uncomplicated    Rehab Potential Good    PT Frequency 2x / week    PT Duration 6  weeks    PT Treatment/Interventions ADLs/Self Care Home Management;Electrical Stimulation;Traction;Gait training;Stair training;Functional mobility training;Therapeutic activities;Therapeutic exercise;Balance training;Neuromuscular re-education;Manual techniques;Passive range of motion;Dry needling;Joint Manipulations;Spinal Manipulations    PT Next Visit Plan re-assess gym routine for compound lifts. PROGRESS NOTE    PT Home Exercise Plan cervical SNAG, LTR, PPT 5/11 supine chin tuck, scapular retraction, ab march, child's pose, cat-cow, goblet squat, dumbell deadlift    Consulted and Agree with Plan of Care Patient           Patient will benefit from skilled therapeutic intervention in order to improve the following deficits and impairments:  Decreased activity tolerance,Decreased range of motion,Decreased strength,Increased muscle spasms,Impaired perceived functional ability,Postural dysfunction,Improper body mechanics,Pain,Obesity  Visit Diagnosis: Muscle weakness (generalized)  Acute bilateral low back pain without sciatica  Neck pain     Problem List Patient Active Problem List   Diagnosis Date Noted  . Iron deficiency anemia 12/13/2020  . Menopausal symptoms 03/14/2020  . Encounter for well woman exam with routine gynecological exam 03/14/2020  . Screening for colorectal cancer 03/14/2020  . BMI 38.0-38.9,adult 03/14/2020  . History of gastric surgery 05/10/2019  . Class 2 obesity 06/25/2017  . Peri-menopausal 09/12/2015  . GERD (gastroesophageal reflux disease) 09/11/2015  . Heel spur 04/17/2015  . Right knee pain 01/15/2014  . Diabetes mellitus (HCC) 07/12/2013  . Essential hypertension, benign 05/30/2012   11:28 AM, 03/17/21 M. Shary Decamp, PT, DPT Physical Therapist- [place] Office Number: [phone]  [facility] [address] [place], [place], [address] Phone: [phone]   Fax:  [phone]  Name: Ariel [name] MRN: [id number] Date of Birth: Mar 27, 1971

## 2021-03-17 NOTE — Patient Instructions (Signed)
Access Code: [email or web address] URL: [email or web address] Date: 03/17/2021 Prepared by: Shary Decamp  Exercises Goblet Squat with Kettlebell - 1 x daily - 7 x weekly - 3 sets - 10 reps Dumbell Squat at Sides - 1 x daily - 7 x weekly - 3 sets - 10 reps

## 2021-03-19 ENCOUNTER — Encounter (INDEPENDENT_AMBULATORY_CARE_PROVIDER_SITE_OTHER): Payer: Self-pay | Admitting: Gastroenterology

## 2021-03-19 ENCOUNTER — Other Ambulatory Visit: Payer: Self-pay

## 2021-03-19 ENCOUNTER — Ambulatory Visit (HOSPITAL_COMMUNITY): Payer: 59

## 2021-03-19 DIAGNOSIS — M6281 Muscle weakness (generalized): Secondary | ICD-10-CM | POA: Diagnosis not present

## 2021-03-19 DIAGNOSIS — M542 Cervicalgia: Secondary | ICD-10-CM

## 2021-03-19 DIAGNOSIS — M545 Low back pain, unspecified: Secondary | ICD-10-CM

## 2021-03-19 NOTE — Therapy (Signed)
[facility] [address] [place], [place], [address] Phone: [phone]   Fax:  [phone]  Physical Therapy Treatment and Progress Note  Patient Details  Name: Ariel [name] MRN: [id number] Date of Birth: May 03, 1971 Referring Provider (PT): Valentino Nose, NP  Progress Note Reporting Period 02/17/21 to 03/19/21  See note below for Objective Data and Assessment of Progress/Goals.      Encounter Date: 03/19/2021   PT End of Session - 03/19/21 0954    Visit Number 10    Number of Visits 12    Date for PT Re-Evaluation 03/31/21    Authorization Type A[employer] NAP, no auth, VL medical necessity    Progress Note Due on Visit 20    PT Start Time [phone]    PT Stop Time 1033    PT Time Calculation (min) 45 min    Activity Tolerance Patient tolerated treatment well;Patient limited by pain;No increased pain    Behavior During Therapy WFL for tasks assessed/performed           Past Medical History:  Diagnosis Date  . Boil of buttock 09/12/2015   comes and goes- not at present  . BV (bacterial vaginosis) 08/09/2013   03-05-16 no problems now  . Diabetes mellitus without complication (HCC)    borderline  . Hypertension   . Obesity   . Panic attack 07/2015   wakes up from sleep with panic attack  . Transfusion history    25 yrs ago after childbirth -NVD  . Vaginal discharge 04/15/2016  . Vaginal itching 08/09/2013   no problem now  . Yeast infection 04/15/2016    Past Surgical History:  Procedure Laterality Date  . LAPAROSCOPIC GASTRIC SLEEVE RESECTION N/A 03/17/2016   Procedure: LAPAROSCOPIC GASTRIC SLEEVE RESECTION WITH UPPER ENDOSCOPY;  Surgeon: Ovidio Kin, MD;  Location: [facility];  Service: General;  Laterality: N/A;  . TUBAL LIGATION    . UPPER GI ENDOSCOPY  03/17/2016   Procedure: UPPER GI ENDOSCOPY;  Surgeon: Ovidio Kin, MD;  Location: [facility];  Service: General;;  . WISDOM TOOTH EXTRACTION      There were no vitals filed for  this visit.   Subjective Assessment - 03/19/21 0953    Subjective Pt reports she was able to go to [facility] yesterday and worked some with dumbells and walked on the treadmill for 20 minutes without bothering her back    Currently in Pain? Yes    Pain Score 3     Pain Location Back    Pain Orientation Left    Pain Descriptors / Indicators Sore    Pain Type Acute pain    Aggravating Factors  forward bending              [facility] PT Assessment - 03/19/21 0001      Assessment   Medical Diagnosis neck and low back pain    Referring Provider (PT) Valentino Nose, NP    Onset Date/Surgical Date 01/20/21      Observation/Other Assessments   Focus on Therapeutic Outcomes (FOTO)  63%      AROM   Cervical Flexion WNL    Cervical Extension WNL    Cervical - Right Side Bend WNL    Cervical - Left Side Bend WNL    Cervical - Right Rotation WNL    Cervical - Left Rotation WNL    Lumbar Flexion 10% limited    Lumbar Extension WNL    Lumbar - Right Side  Bend WNL    Lumbar - Left Side Bend WNL    Lumbar - Right Rotation WNL    Lumbar - Left Rotation WNL      Strength   Lumbar Flexion 2+/5    Lumbar Extension 3/5      Palpation   Palpation comment some tenderness to palpation right trapezial ridge and levator. Lumbar spine palpation unrevealing for trigger point or hyperirritability                         [facility] Adult PT Treatment/Exercise - 03/19/21 0001      Lumbar Exercises: Standing   Other Standing Lumbar Exercises stiff arm pull down 2 plates 5M84   halos with 10 lbs weight 2x10   Other Standing Lumbar Exercises paloff press with tower @ 4 plates 1L24                  PT Education - 03/19/21 1049    Education Details discussion with pt regarding progress of STG/LTG and remaining deficits/limitations    Person(s) Educated Patient    Methods Explanation;Demonstration    Comprehension Verbalized understanding            PT Short Term Goals -  03/19/21 1015      PT SHORT TERM GOAL #1   Title Patient will report at least 25% improvement in symptoms for improved quality of life.    Baseline 75% better    Time 3    Period Weeks    Status Achieved    Target Date 03/10/21      PT SHORT TERM GOAL #2   Title Patient will demo full cervical rotation with pain not exceeding 3/10    Baseline full rotation with some discomfort in right rotation, "more of a nag than pain"    Time 3    Period Weeks    Status Achieved    Target Date 03/10/21      PT SHORT TERM GOAL #3   Title Demo full, pain-free lumbar flexion ROM    Baseline 10% limited, 3/10 pain    Time 3    Period Weeks    Status On-going    Target Date 03/10/21      PT SHORT TERM GOAL #4   Title Patient will be independent with HEP in order to improve functional outcomes.    Time 3    Period Weeks    Status Achieved    Target Date 03/10/21             PT Long Term Goals - 03/19/21 1023      PT LONG TERM GOAL #1   Title Demo full, pain-free cervical ROM to improve comfort and safety with driving    Baseline full ROM, no issues when driving    Time 6    Period Weeks    Status Achieved      PT LONG TERM GOAL #2   Title Demo trunk strength of 3+/5 to improve lumbar stability    Baseline 2+/5 flexion, 3/5 extension    Time 6    Period Weeks    Status On-going      PT LONG TERM GOAL #3   Title Patient will improve on FOTO score to meet predicted outcomes to improve functional independence    Baseline 45% function at start of care, currently 63% function    Time 6    Period Weeks    Status Achieved  Plan - 03/19/21 1050    Clinical Impression Statement Pt is progressing very well with POC details and demonstrates improved cervical spine symptoms with restoration of ROM and limited c/o discomfort in her neck and reports her work duties have become easier and less painful as a result.  Continuing to progress with lumbar spine details and  demonstrates less painful movement and tolerating increased activities and core stabilization exercises without adverse effects.  We have been able to initiate training for gym-based program as this was a typical activity prior to injury. Continue PT sessions indicated to improve lumbar flexion ROM and increase core strength/stability to reduce lumbar pain    Personal Factors and Comorbidities Comorbidity 1;Time since onset of injury/illness/exacerbation;Fitness;Profession    Comorbidities obesity,    Examination-Activity Limitations Lift;Stand;Squat;Sit;Reach Overhead;Locomotion Level;Bend;Carry    Examination-Participation Restrictions Cleaning;Community Activity;Driving;Yard Work;Occupation    Stability/Clinical Decision Making Stable/Uncomplicated    Rehab Potential Good    PT Frequency 2x / week    PT Duration 6 weeks    PT Treatment/Interventions ADLs/Self Care Home Management;Electrical Stimulation;Traction;Gait training;Stair training;Functional mobility training;Therapeutic activities;Therapeutic exercise;Balance training;Neuromuscular re-education;Manual techniques;Passive range of motion;Dry needling;Joint Manipulations;Spinal Manipulations    PT Next Visit Plan re-assess gym routine for compound lifts.    PT Home Exercise Plan cervical SNAG, LTR, PPT 5/11 supine chin tuck, scapular retraction, ab march, child's pose, cat-cow, goblet squat, dumbell deadlift, paloff press, stiff-arm pull down    Consulted and Agree with Plan of Care Patient           Patient will benefit from skilled therapeutic intervention in order to improve the following deficits and impairments:  Decreased activity tolerance,Decreased range of motion,Decreased strength,Increased muscle spasms,Impaired perceived functional ability,Postural dysfunction,Improper body mechanics,Pain,Obesity  Visit Diagnosis: Muscle weakness (generalized)  Acute bilateral low back pain without sciatica  Neck pain     Problem  List Patient Active Problem List   Diagnosis Date Noted  . Iron deficiency anemia 12/13/2020  . Menopausal symptoms 03/14/2020  . Encounter for well woman exam with routine gynecological exam 03/14/2020  . Screening for colorectal cancer 03/14/2020  . BMI 38.0-38.9,adult 03/14/2020  . History of gastric surgery 05/10/2019  . Class 2 obesity 06/25/2017  . Peri-menopausal 09/12/2015  . GERD (gastroesophageal reflux disease) 09/11/2015  . Heel spur 04/17/2015  . Right knee pain 01/15/2014  . Diabetes mellitus (HCC) 07/12/2013  . Essential hypertension, benign 05/30/2012   10:57 AM, 03/19/21 M. Shary Decamp, PT, DPT Physical Therapist- [place] Office Number: [phone]  [facility] [address] [place], [place], [address] Phone: [phone]   Fax:  [phone]  Name: Ariel [name] MRN: [id number] Date of Birth: January 12, 1971

## 2021-03-24 ENCOUNTER — Ambulatory Visit (HOSPITAL_COMMUNITY): Payer: 59

## 2021-03-24 ENCOUNTER — Other Ambulatory Visit: Payer: Self-pay

## 2021-03-24 DIAGNOSIS — M542 Cervicalgia: Secondary | ICD-10-CM

## 2021-03-24 DIAGNOSIS — M545 Low back pain, unspecified: Secondary | ICD-10-CM

## 2021-03-24 DIAGNOSIS — M6281 Muscle weakness (generalized): Secondary | ICD-10-CM

## 2021-03-24 NOTE — Therapy (Signed)
[facility] [address] [place], [place], [address] Phone: [phone]   Fax:  [phone]  Physical Therapy Treatment  Patient Details  Name: Ariel [name] MRN: [id number] Date of Birth: Oct 09, 1971 Referring Provider (PT): Valentino Nose, NP   Encounter Date: 03/24/2021   PT End of Session - 03/24/21 0949     Visit Number 11    Number of Visits 12    Date for PT Re-Evaluation 03/31/21    Authorization Type Aetna NAP, no auth, VL medical necessity    Progress Note Due on Visit 20    PT Start Time [phone]    PT Stop Time 1030    PT Time Calculation (min) 42 min    Activity Tolerance Patient tolerated treatment well;Patient limited by pain;No increased pain    Behavior During Therapy [facility] for tasks assessed/performed             Past Medical History:  Diagnosis Date   Boil of buttock 09/12/2015   comes and goes- not at present   BV (bacterial vaginosis) 08/09/2013   03-05-16 no problems now   Diabetes mellitus without complication (HCC)    borderline   Hypertension    Obesity    Panic attack 07/2015   wakes up from sleep with panic attack   Transfusion history    25 yrs ago after childbirth -NVD   Vaginal discharge 04/15/2016   Vaginal itching 08/09/2013   no problem now   Yeast infection 04/15/2016    Past Surgical History:  Procedure Laterality Date   LAPAROSCOPIC GASTRIC SLEEVE RESECTION N/A 03/17/2016   Procedure: LAPAROSCOPIC GASTRIC SLEEVE RESECTION WITH UPPER ENDOSCOPY;  Surgeon: Ovidio Kin, MD;  Location: [facility];  Service: General;  Laterality: N/A;   TUBAL LIGATION     UPPER GI ENDOSCOPY  03/17/2016   Procedure: UPPER GI ENDOSCOPY;  Surgeon: Ovidio Kin, MD;  Location: [facility];  Service: General;;   WISDOM TOOTH EXTRACTION      There were no vitals filed for this visit.   Subjective Assessment - 03/24/21 0951     Subjective Reports some increase in low back soreness and reports she worked full shifts this  weekend and went to the gym for weight lifting    Currently in Pain? Yes    Pain Score 4     Pain Location Back    Pain Orientation Lower    Pain Descriptors / Indicators Sore    Pain Type Acute pain                [facility] PT Assessment - 03/24/21 0001       Assessment   Medical Diagnosis neck and low back pain    Referring Provider (PT) Valentino Nose, NP    Onset Date/Surgical Date 01/20/21                           [facility] Adult PT Treatment/Exercise - 03/24/21 0001       Lumbar Exercises: Stretches   Lower Trunk Rotation 2 reps;60 seconds    ITB Stretch Right;Left;2 reps;60 seconds      Lumbar Exercises: Standing   Functional Squats --   3x10 performing deadlift, front squat, air squat   Lifting From floor;10 reps    Lifting Weights (lbs) 8 lbs sumo squat    Row Power tower;Strengthening;20 reps   4 plates   Other Standing Lumbar Exercises overhead press 8 lbs  2x10, emphasis on glute recruitment for stabilization. Paloff press 4 plates 6B84      Lumbar Exercises: Quadruped   Plank 10x for 5 sec                      PT Short Term Goals - 03/19/21 1015       PT SHORT TERM GOAL #1   Title Patient will report at least 25% improvement in symptoms for improved quality of life.    Baseline 75% better    Time 3    Period Weeks    Status Achieved    Target Date 03/10/21      PT SHORT TERM GOAL #2   Title Patient will demo full cervical rotation with pain not exceeding 3/10    Baseline full rotation with some discomfort in right rotation, "more of a nag than pain"    Time 3    Period Weeks    Status Achieved    Target Date 03/10/21      PT SHORT TERM GOAL #3   Title Demo full, pain-free lumbar flexion ROM    Baseline 10% limited, 3/10 pain    Time 3    Period Weeks    Status On-going    Target Date 03/10/21      PT SHORT TERM GOAL #4   Title Patient will be independent with HEP in order to improve functional outcomes.     Time 3    Period Weeks    Status Achieved    Target Date 03/10/21               PT Long Term Goals - 03/19/21 1023       PT LONG TERM GOAL #1   Title Demo full, pain-free cervical ROM to improve comfort and safety with driving    Baseline full ROM, no issues when driving    Time 6    Period Weeks    Status Achieved      PT LONG TERM GOAL #2   Title Demo trunk strength of 3+/5 to improve lumbar stability    Baseline 2+/5 flexion, 3/5 extension    Time 6    Period Weeks    Status On-going      PT LONG TERM GOAL #3   Title Patient will improve on FOTO score to meet predicted outcomes to improve functional independence    Baseline 45% function at start of care, currently 63% function    Time 6    Period Weeks    Status Achieved                   Plan - 03/24/21 1016     Clinical Impression Statement Pt notes soreness in her low back but attributes this to increasing her activity level.  Demonstrating improve core strength and recruitment as evidenced by ability to perform overhead press with 8 lbs weights and progress to performing front plank with good stability and form although with decreased endurance only tolerating 5-10 sec increments.  Continued POC to improve core strength and lifting mechanics to reduce risk for re-injury    Personal Factors and Comorbidities Comorbidity 1;Time since onset of injury/illness/exacerbation;Fitness;Profession    Comorbidities obesity,    Examination-Activity Limitations Lift;Stand;Squat;Sit;Reach Overhead;Locomotion Level;Bend;Carry    Examination-Participation Restrictions Cleaning;Community Activity;Driving;Yard Work;Occupation    Stability/Clinical Decision Making Stable/Uncomplicated    Rehab Potential Good    PT Frequency 2x / week    PT Duration 6 weeks  PT Treatment/Interventions ADLs/Self Care Home Management;Electrical Stimulation;Traction;Gait training;Stair training;Functional mobility training;Therapeutic  activities;Therapeutic exercise;Balance training;Neuromuscular re-education;Manual techniques;Passive range of motion;Dry needling;Joint Manipulations;Spinal Manipulations    PT Next Visit Plan re-assess gym routine for compound lifts.    PT Home Exercise Plan cervical SNAG, LTR, PPT 5/11 supine chin tuck, scapular retraction, ab march, child's pose, cat-cow, goblet squat, dumbell deadlift, paloff press, stiff-arm pull down    Consulted and Agree with Plan of Care Patient             Patient will benefit from skilled therapeutic intervention in order to improve the following deficits and impairments:  Decreased activity tolerance, Decreased range of motion, Decreased strength, Increased muscle spasms, Impaired perceived functional ability, Postural dysfunction, Improper body mechanics, Pain, Obesity  Visit Diagnosis: Muscle weakness (generalized)  Acute bilateral low back pain without sciatica  Neck pain     Problem List Patient Active Problem List   Diagnosis Date Noted   Iron deficiency anemia 12/13/2020   Menopausal symptoms 03/14/2020   Encounter for well woman exam with routine gynecological exam 03/14/2020   Screening for colorectal cancer 03/14/2020   BMI 38.0-38.9,adult 03/14/2020   History of gastric surgery 05/10/2019   Class 2 obesity 06/25/2017   Peri-menopausal 09/12/2015   GERD (gastroesophageal reflux disease) 09/11/2015   Heel spur 04/17/2015   Right knee pain 01/15/2014   Diabetes mellitus (HCC) 07/12/2013   Essential hypertension, benign 05/30/2012   10:28 AM, 03/24/21 M. Shary Decamp, PT, DPT Physical Therapist- [place] Office Number: [phone]   [facility] [address] [place], [place], [address] Phone: [phone]   Fax:  [phone]  Name: Ariel [name] MRN: [id number] Date of Birth: 08-31-1971

## 2021-03-26 ENCOUNTER — Other Ambulatory Visit: Payer: Self-pay

## 2021-03-26 ENCOUNTER — Ambulatory Visit (HOSPITAL_COMMUNITY): Payer: 59

## 2021-03-26 ENCOUNTER — Encounter (HOSPITAL_COMMUNITY): Payer: Self-pay

## 2021-03-26 DIAGNOSIS — M542 Cervicalgia: Secondary | ICD-10-CM

## 2021-03-26 DIAGNOSIS — M545 Low back pain, unspecified: Secondary | ICD-10-CM

## 2021-03-26 DIAGNOSIS — M6281 Muscle weakness (generalized): Secondary | ICD-10-CM

## 2021-03-26 NOTE — Therapy (Signed)
[facility] [address] [place], [place], [address] Phone: [phone]   Fax:  [phone]  Physical Therapy Treatment and Discharge Summary  Patient Details  Name: Ariel [name] MRN: [id number] Date of Birth: 1971/10/07 Referring Provider (PT): Eulogio Bear, NP  PHYSICAL THERAPY DISCHARGE SUMMARY  Visits from Start of Care: 12  Current functional level related to goals / functional outcomes: Pt able to meet majority of goals and demonstrates improved functional performance, activity tolerance, and return to typical employment duties with little discomfort   Remaining deficits: Limit lumbar flexion of 10%   Education / Equipment: HEP, gym-routine   Patient agrees to discharge. Patient goals were partially met. Patient is being discharged due to meeting the stated rehab goals.  Encounter Date: 03/26/2021   PT End of Session - 03/26/21 0949     Visit Number 12    Number of Visits 12    Date for PT Re-Evaluation 03/31/21    Authorization Type [employer] NAP, no auth, VL medical necessity    Progress Note Due on Visit 20    PT Start Time [phone]    PT Stop Time 1030    PT Time Calculation (min) 42 min    Activity Tolerance Patient tolerated treatment well;Patient limited by pain;No increased pain    Behavior During Therapy [facility] for tasks assessed/performed             Past Medical History:  Diagnosis Date   Boil of buttock 09/12/2015   comes and goes- not at present   BV (bacterial vaginosis) 08/09/2013   03-05-16 no problems now   Diabetes mellitus without complication ([place])    borderline   Hypertension    Obesity    Panic attack 07/2015   wakes up from sleep with panic attack   Transfusion history    25 yrs ago after childbirth -NVD   Vaginal discharge 04/15/2016   Vaginal itching 08/09/2013   no problem now   Yeast infection 04/15/2016    Past Surgical History:  Procedure Laterality Date   LAPAROSCOPIC GASTRIC  SLEEVE RESECTION N/A 03/17/2016   Procedure: LAPAROSCOPIC GASTRIC SLEEVE RESECTION WITH UPPER ENDOSCOPY;  Surgeon: Alphonsa Overall, MD;  Location: [facility];  Service: General;  Laterality: N/A;   TUBAL LIGATION     UPPER GI ENDOSCOPY  03/17/2016   Procedure: UPPER GI ENDOSCOPY;  Surgeon: Alphonsa Overall, MD;  Location: [facility];  Service: General;;   WISDOM TOOTH EXTRACTION      There were no vitals filed for this visit.   Subjective Assessment - 03/26/21 0951     Subjective Pt notes some muscle soreness from exercises but not pain like it was at the beginning    How long can you sit comfortably? no issues    How long can you stand comfortably? able to change position as needed    Currently in Pain? Yes    Pain Score 3     Pain Location Back    Pain Orientation Lower    Pain Descriptors / Indicators Sore    Pain Type Acute pain    Pain Onset 1 to 4 weeks ago    Pain Frequency Intermittent    Multiple Pain Sites Yes    Pain Score 1    Pain Location Neck    Pain Orientation Lower    Pain Descriptors / Indicators Sore    Pain Type Acute pain    Aggravating Factors  overhead lifting  [facility] PT Assessment - 03/26/21 0001       Assessment   Medical Diagnosis neck and low back pain    Referring Provider (PT) Martinez, Jessica A, NP    Onset Date/Surgical Date 01/20/21      Observation/Other Assessments   Focus on Therapeutic Outcomes (FOTO)  63% function      AROM   Cervical Flexion WNL    Cervical Extension WNL    Cervical - Right Side Bend WNL    Cervical - Left Side Bend WNL    Cervical - Right Rotation WNL    Cervical - Left Rotation WNL    Lumbar Flexion 10% limited    Lumbar Extension WNL    Lumbar - Right Side Bend WNL    Lumbar - Left Side Bend WNL    Lumbar - Right Rotation WNL    Lumbar - Left Rotation WNL      Strength   Lumbar Flexion 2+/5    Lumbar Extension 3/5                           [facility] Adult PT Treatment/Exercise -  03/26/21 0001       Lumbar Exercises: Standing   Other Standing Lumbar Exercises stiff arm pull down 2 plates 3x10    Other Standing Lumbar Exercises paloff press 3 plates 3x10      Lumbar Exercises: Seated   Other Seated Lumbar Exercises lat. pull down 3x10      Lumbar Exercises: Quadruped   Plank 10x for 5 sec                      PT Short Term Goals - 03/26/21 1004       PT SHORT TERM GOAL #1   Title Patient will report at least 25% improvement in symptoms for improved quality of life.    Baseline 75% better    Time 3    Period Weeks    Status Achieved    Target Date 03/10/21      PT SHORT TERM GOAL #2   Title Patient will demo full cervical rotation with pain not exceeding 3/10    Baseline full rotation with some discomfort in right rotation, "more of a nag than pain"    Time 3    Period Weeks    Status Achieved    Target Date 03/10/21      PT SHORT TERM GOAL #3   Title Demo full, pain-free lumbar flexion ROM    Baseline 10% limited, 3/10 pain    Time 3    Period Weeks    Status Not Met    Target Date 03/10/21      PT SHORT TERM GOAL #4   Title Patient will be independent with HEP in order to improve functional outcomes.    Time 3    Period Weeks    Status Achieved    Target Date 03/10/21               PT Long Term Goals - 03/26/21 1006       PT LONG TERM GOAL #1   Title Demo full, pain-free cervical ROM to improve comfort and safety with driving    Baseline full ROM, no issues when driving    Time 6    Period Weeks    Status Achieved      PT LONG TERM GOAL #2   Title Demo trunk   strength of 3+/5 to improve lumbar stability    Baseline 2+/5 flexion, 3/5 extension    Time 6    Period Weeks    Status Not Met      PT LONG TERM GOAL #3   Title Patient will improve on FOTO score to meet predicted outcomes to improve functional independence    Baseline 45% function at start of care, currently 63% function    Time 6    Period Weeks     Status Achieved                   Plan - 03/26/21 1017     Clinical Impression Statement Patient has done very well throughout POC and demonstrates significant improvements in ROM, strength, and activity tolerance with minimial dysfunction from her neck and only some lingering left side LBP which limits lumbar flexion by about 10% with report of 3/10 pain/discomfort.  Pt reports she has resumed her typical employment duties and has been going to a gym for exercise 2-3 times a week and has integrated therapy activities into thie routine.  Discussion regarding lingering soft tissue dysfunction and discussed benefits of regular professional massage to address soft tissue pain.    Personal Factors and Comorbidities Comorbidity 1;Time since onset of injury/illness/exacerbation;Fitness;Profession    Comorbidities obesity,    Examination-Activity Limitations Lift;Stand;Squat;Sit;Reach Overhead;Locomotion Level;Bend;Carry    Examination-Participation Restrictions Cleaning;Community Activity;Driving;Yard Work;Occupation    Stability/Clinical Decision Making Stable/Uncomplicated    Rehab Potential Good    PT Frequency 2x / week    PT Duration 6 weeks    PT Treatment/Interventions ADLs/Self Care Home Management;Electrical Stimulation;Traction;Gait training;Stair training;Functional mobility training;Therapeutic activities;Therapeutic exercise;Balance training;Neuromuscular re-education;Manual techniques;Passive range of motion;Dry needling;Joint Manipulations;Spinal Manipulations    PT Next Visit Plan D/C to HEP and gym routine    PT Home Exercise Plan cervical SNAG, LTR, PPT 5/11 supine chin tuck, scapular retraction, ab march, child's pose, cat-cow, goblet squat, dumbell deadlift, paloff press, stiff-arm pull down    Consulted and Agree with Plan of Care Patient             Patient will benefit from skilled therapeutic intervention in order to improve the following deficits and  impairments:  Decreased activity tolerance, Decreased range of motion, Decreased strength, Increased muscle spasms, Impaired perceived functional ability, Postural dysfunction, Improper body mechanics, Pain, Obesity  Visit Diagnosis: Muscle weakness (generalized)  Acute bilateral low back pain without sciatica  Neck pain     Problem List Patient Active Problem List   Diagnosis Date Noted   Iron deficiency anemia 12/13/2020   Menopausal symptoms 03/14/2020   Encounter for well woman exam with routine gynecological exam 03/14/2020   Screening for colorectal cancer 03/14/2020   BMI 38.0-38.9,adult 03/14/2020   History of gastric surgery 05/10/2019   Class 2 obesity 06/25/2017   Peri-menopausal 09/12/2015   GERD (gastroesophageal reflux disease) 09/11/2015   Heel spur 04/17/2015   Right knee pain 01/15/2014   Diabetes mellitus (HCC) 07/12/2013   Essential hypertension, benign 05/30/2012   10:23 AM, 03/26/21 M. Kelly Halpin, PT, DPT Physical Therapist- [place] Office Number: [phone]   [facility] [address] [place], [place], [address] Phone: [phone]   Fax:  [phone]  Name: Ariel [name] MRN: [id number] Date of Birth: 05/28/1971    

## 2021-03-31 ENCOUNTER — Encounter (HOSPITAL_COMMUNITY): Payer: 59

## 2021-04-02 ENCOUNTER — Encounter (HOSPITAL_COMMUNITY): Payer: 59

## 2021-04-08 ENCOUNTER — Ambulatory Visit: Payer: 59 | Admitting: Podiatry

## 2021-04-08 ENCOUNTER — Other Ambulatory Visit: Payer: Self-pay

## 2021-04-08 DIAGNOSIS — M7672 Peroneal tendinitis, left leg: Secondary | ICD-10-CM | POA: Diagnosis not present

## 2021-04-09 ENCOUNTER — Encounter: Payer: Self-pay | Admitting: Podiatry

## 2021-04-09 NOTE — Progress Notes (Signed)
Subjective:  Patient ID: Ariel [name], female    DOB: Jan 07, 1971,  MRN: [id number]  Chief Complaint  Patient presents with   Foot Pain    Left foot pain  PT stated that she is doing a little better she can still feel some of the pain     50 y.o. female presents with the above complaint.  Patient presents with follow-up of left peroneal tendinitis.  She states she is doing good.  She still has some pain but overall doing much better.  She denies any other acute complaints she would like to discuss any other treatment options are available.   Review of Systems: Negative except as noted in the HPI. Denies N/V/F/Ch.  Past Medical History:  Diagnosis Date   Boil of buttock 09/12/2015   comes and goes- not at present   BV (bacterial vaginosis) 08/09/2013   03-05-16 no problems now   Diabetes mellitus without complication (HCC)    borderline   Hypertension    Obesity    Panic attack 07/2015   wakes up from sleep with panic attack   Transfusion history    25 yrs ago after childbirth -NVD   Vaginal discharge 04/15/2016   Vaginal itching 08/09/2013   no problem now   Yeast infection 04/15/2016    Current Outpatient Medications:    acetaminophen (TYLENOL) 325 MG tablet, Take 650 mg by mouth every 6 (six) hours as needed (For pain.)., Disp: , Rfl:    Calcium Carbonate-Vitamin D (CALCIUM 600/VITAMIN D) 600-400 MG-UNIT chew tablet, Chew 2 tablets by mouth daily., Disp: 60 tablet, Rfl: 0   Cyanocobalamin (VITAMIN B-12) 500 MCG SUBL, Place 1 tablet (500 mcg total) under the tongue daily., Disp: 150 tablet, Rfl:    diclofenac Sodium (VOLTAREN) 1 % GEL, Apply 2 g topically 4 (four) times daily., Disp: 2 g, Rfl: 2   esomeprazole (NEXIUM) 20 MG capsule, Take 20 mg by mouth daily as needed., Disp: , Rfl:    ferrous sulfate (SM IRON SLOW RELEASE) 160 (50 Fe) MG TBCR SR tablet, Take 1 tablet (160 mg total) by mouth daily., Disp: 30 tablet, Rfl: 2   fluticasone (FLONASE) 50 MCG/ACT nasal spray,  INSTILL 2 SPRAYS INTO EACH NOSTRIL DAILY., Disp: 16 g, Rfl: 0   hydrochlorothiazide (MICROZIDE) 12.5 MG capsule, Take 1 capsule (12.5 mg total) by mouth daily., Disp: 90 capsule, Rfl: 1   methocarbamol (ROBAXIN) 500 MG tablet, Take 1 tablet (500 mg total) by mouth 3 (three) times daily., Disp: 21 tablet, Rfl: 0   Multiple Vitamin (MULTIVITAMIN WITH MINERALS) TABS tablet, Take 1 tablet by mouth daily., Disp: , Rfl:    Semaglutide, 1 MG/DOSE, 4 MG/3ML SOPN, Inject 1 mg into the skin once a week., Disp: 3 mL, Rfl: 2  Social History   Tobacco Use  Smoking Status Never  Smokeless Tobacco Never    No Known Allergies Objective:  There were no vitals filed for this visit. There is no height or weight on file to calculate BMI. Constitutional Well developed. Well nourished.  Vascular Dorsalis pedis pulses palpable bilaterally. Posterior tibial pulses palpable bilaterally. Capillary refill normal to all digits.  No cyanosis or clubbing noted. Pedal hair growth normal.  Neurologic Normal speech. Oriented to person, place, and time. Epicritic sensation to light touch grossly present bilaterally.  Dermatologic Nails well groomed and normal in appearance. No open wounds. No skin lesions.  Orthopedic: Now pain on palpation along the course of the peroneal tendon including its insertion of the  fifth metatarsal base.  Now pain with resisted dorsiflexion eversion of the foot.  No pain with plantarflexion inversion of the foot.  No pain at the ATFL ligament, Achilles tendon, posterior tibial tendon.   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  Plantar and posterior heel spurring noted.  No other bony abnormalities identified. Assessment:   1. Peroneal tendinitis of left lower extremity     Plan:  Patient was evaluated and treated and all questions answered.  Left peroneal  tendinitis -Clinically healed with a Tri-Lock ankle brace.  I discussed with her to continue using the ankle brace as needed.  She can start transitioning out of the brace and regular shoes.  If she is unable to do so I have asked her to come and see me.  I discussed shoe gear modification with her as well.  If any foot and ankle issues come back and see me.  She states understanding No follow-ups on file.

## 2021-04-24 ENCOUNTER — Other Ambulatory Visit: Payer: Self-pay

## 2021-04-24 ENCOUNTER — Ambulatory Visit (INDEPENDENT_AMBULATORY_CARE_PROVIDER_SITE_OTHER): Payer: 59 | Admitting: Nurse Practitioner

## 2021-04-24 ENCOUNTER — Encounter: Payer: Self-pay | Admitting: Nurse Practitioner

## 2021-04-24 VITALS — BP 124/80 | HR 73 | Temp 98.3°F | Ht 65.0 in | Wt 216.2 lb

## 2021-04-24 DIAGNOSIS — Z23 Encounter for immunization: Secondary | ICD-10-CM

## 2021-04-24 DIAGNOSIS — E119 Type 2 diabetes mellitus without complications: Secondary | ICD-10-CM | POA: Diagnosis not present

## 2021-04-24 DIAGNOSIS — M545 Low back pain, unspecified: Secondary | ICD-10-CM

## 2021-04-24 DIAGNOSIS — Z6838 Body mass index (BMI) 38.0-38.9, adult: Secondary | ICD-10-CM

## 2021-04-24 DIAGNOSIS — Z1231 Encounter for screening mammogram for malignant neoplasm of breast: Secondary | ICD-10-CM

## 2021-04-24 DIAGNOSIS — Z9889 Other specified postprocedural states: Secondary | ICD-10-CM

## 2021-04-24 DIAGNOSIS — I10 Essential (primary) hypertension: Secondary | ICD-10-CM

## 2021-04-24 MED ORDER — HYDROCHLOROTHIAZIDE 12.5 MG PO CAPS: 12.5000 mg | ORAL_CAPSULE | Freq: Every day | ORAL | 1 refills | Status: DC

## 2021-04-24 MED ORDER — SHINGRIX 50 MCG/0.5ML IM SUSR
0.5000 mL | Freq: Once | INTRAMUSCULAR | 1 refills | Status: AC
Start: 2021-04-24 — End: 2021-04-24

## 2021-04-24 MED ORDER — SEMAGLUTIDE (1 MG/DOSE) 4 MG/3ML ~~LOC~~ SOPN: 1.0000 mg | PEN_INJECTOR | SUBCUTANEOUS | 2 refills | Status: DC

## 2021-04-24 MED ORDER — METHOCARBAMOL 500 MG PO TABS: 500.0000 mg | ORAL_TABLET | Freq: Every evening | ORAL | 0 refills | Status: DC | PRN

## 2021-04-24 NOTE — Assessment & Plan Note (Signed)
Chronic.  Blood pressure is well controlled today in clinic.  Encouraged patient to check blood pressure at home a few times weekly.  Continue hydrochlorothiazide 12.5 mg daily-refills given.  We will check kidney function with electrolytes today.  Plan to follow-up in 6 months or sooner if needed.

## 2021-04-24 NOTE — Assessment & Plan Note (Signed)
In January, vitamin B-12 and vitamin D were normal.  Iron was slightly low.  We will plan to recheck blood counts today-she is taking vitamin D, iron, and vitamin B12.  Plan to keep a close eye on given history of gastric sleeve in 2017.  Follow-up in 6 months.

## 2021-04-24 NOTE — Assessment & Plan Note (Signed)
Continue Ozempic 1 mg weekly.  Encourage physical activity that is nonweightbearing like biking.  Goal of physical activity 30 minutes 5 times weekly.  Increase water intake to 64 ounces daily.  Increase intake of vegetables and lean proteins.  Follow-up in 6 months.

## 2021-04-24 NOTE — Assessment & Plan Note (Signed)
Chronic.  Last A1c in January 2022 was 6.3%.  Tolerating Ozempic 1 mg weekly well.  We will continue this medication as she has also had great weight loss success with it.  Will check A1c today and kidney function.  Continue with great physical activity- going to try riding the bike secondary to foot pain.  Continue increasing on water intake.  Plans to schedule eye exam.  Foot exam and urine microalbumin are up-to-date.  We will plan to follow-up in 6 months.

## 2021-04-24 NOTE — Progress Notes (Signed)
Subjective:    Patient ID: Ariel [name], female    DOB: October 30, 1970, 50 y.o.   MRN: [id number]  HPI: Ariel [name] is a 50 y.o. female presenting for back pain follow-up.  Chief Complaint  Patient presents with   Follow-up    Follow up for back pain, finished PT, still having some discomfort, doing exercising as suggested   BACK PAIN PT recommended regular massage.  She has been discharged due to improvement.  She reports her back pain is continuing to improve.  She is exercising regularly.  Has not been able to walk as much because she has been having some foot pain.  She wants to make sure its okay that her pain is not gone and is continuing to improve.  DIABETES Doing well with semaglutide 1 mg weekly.  Continues to lose weight.  Goal is less than 200 pounds. Hypoglycemic episodes:no Polydipsia/polyuria: no Visual disturbance: no Chest pain: no Paresthesias: no Glucose Monitoring: no Taking Insulin?: no Blood Pressure Monitoring: not checking Normal BP: Retinal Examination: Not up to Date Foot Exam: Up to Date Diabetic Education: Completed Pneumovax: Up to Date Influenza: Deferred to flu season Aspirin: no  HYPERTENSION Currently taking hydrochlorothiazide 12.5 mg daily in the morning. Hypertension status: controlled  Satisfied with current treatment? yes Duration of hypertension: chronic BP monitoring frequency:  not checking BP medication side effects:  no Medication compliance: Excellent Aspirin: no Recurrent headaches: no Visual changes: no Palpitations: no Dyspnea: no Chest pain: no Lower extremity edema: no Dizzy/lightheaded: no  No Known Allergies  Outpatient Encounter Medications as of 04/24/2021  Medication Sig   acetaminophen (TYLENOL) 325 MG tablet Take 650 mg by mouth every 6 (six) hours as needed (For pain.).   Calcium Carbonate-Vitamin D (CALCIUM 600/VITAMIN D) 600-400 MG-UNIT chew tablet Chew 2 tablets by mouth daily.   Cyanocobalamin  (VITAMIN B-12) 500 MCG SUBL Place 1 tablet (500 mcg total) under the tongue daily.   diclofenac Sodium (VOLTAREN) 1 % GEL Apply 2 g topically 4 (four) times daily.   esomeprazole (NEXIUM) 20 MG capsule Take 20 mg by mouth daily as needed.   ferrous sulfate (SM IRON SLOW RELEASE) 160 (50 Fe) MG TBCR SR tablet Take 1 tablet (160 mg total) by mouth daily.   fluticasone (FLONASE) 50 MCG/ACT nasal spray INSTILL 2 SPRAYS INTO EACH NOSTRIL DAILY.   Multiple Vitamin (MULTIVITAMIN WITH MINERALS) TABS tablet Take 1 tablet by mouth daily.   Zoster Vaccine Adjuvanted [facility]) injection Inject 0.5 mLs into the muscle once for 1 dose. Administer second dose 2-6 months after first dose.   [DISCONTINUED] hydrochlorothiazide (MICROZIDE) 12.5 MG capsule Take 1 capsule (12.5 mg total) by mouth daily.   [DISCONTINUED] methocarbamol (ROBAXIN) 500 MG tablet Take 1 tablet (500 mg total) by mouth 3 (three) times daily.   [DISCONTINUED] Semaglutide, 1 MG/DOSE, 4 MG/3ML SOPN Inject 1 mg into the skin once a week.   hydrochlorothiazide (MICROZIDE) 12.5 MG capsule Take 1 capsule (12.5 mg total) by mouth daily.   methocarbamol (ROBAXIN) 500 MG tablet Take 1 tablet (500 mg total) by mouth at bedtime as needed for muscle spasms.   Semaglutide, 1 MG/DOSE, 4 MG/3ML SOPN Inject 1 mg into the skin once a week.   No facility-administered encounter medications on file as of 04/24/2021.    Patient Active Problem List   Diagnosis Date Noted   Iron deficiency anemia 12/13/2020   Menopausal symptoms 03/14/2020   Encounter for well woman exam with routine gynecological exam  03/14/2020   Screening for colorectal cancer 03/14/2020   BMI 38.0-38.9,adult 03/14/2020   History of gastric surgery 05/10/2019   Class 2 obesity 06/25/2017   Peri-menopausal 09/12/2015   GERD (gastroesophageal reflux disease) 09/11/2015   Heel spur 04/17/2015   Right knee pain 01/15/2014   Diabetes mellitus (HCC) 07/12/2013   Essential hypertension,  benign 05/30/2012    Past Medical History:  Diagnosis Date   Boil of buttock 09/12/2015   comes and goes- not at present   BV (bacterial vaginosis) 08/09/2013   03-05-16 no problems now   Diabetes mellitus without complication (HCC)    borderline   Hypertension    Obesity    Panic attack 07/2015   wakes up from sleep with panic attack   Transfusion history    25 yrs ago after childbirth -NVD   Vaginal discharge 04/15/2016   Vaginal itching 08/09/2013   no problem now   Yeast infection 04/15/2016    Relevant past medical, surgical, family and social history reviewed and updated as indicated. Interim medical history since our last visit reviewed.  Review of Systems Per HPI unless specifically indicated above     Objective:    BP 124/80   Pulse 73   Temp 98.3 F (36.8 C)   Ht 5\' 5"  (1.651 m)   Wt 216 lb 3.2 oz (98.1 kg)   SpO2 97%   BMI 35.98 kg/m   Wt Readings from Last 3 Encounters:  04/24/21 216 lb 3.2 oz (98.1 kg)  02/13/21 226 lb 9.6 oz (102.8 kg)  01/30/21 229 lb 9.6 oz (104.1 kg)    Physical Exam Vitals and nursing note reviewed.  Constitutional:      General: She is not in acute distress.    Appearance: Normal appearance. She is obese. She is not toxic-appearing.  HENT:     Head: Normocephalic and atraumatic.     Right Ear: External ear normal.     Left Ear: External ear normal.     Nose: Nose normal. No congestion.     Mouth/Throat:     Mouth: Mucous membranes are moist.     Pharynx: Oropharynx is clear. No oropharyngeal exudate or posterior oropharyngeal erythema.  Eyes:     General: No scleral icterus.    Extraocular Movements: Extraocular movements intact.  Neck:     Vascular: No carotid bruit.  Cardiovascular:     Rate and Rhythm: Normal rate and regular rhythm.     Heart sounds: Normal heart sounds. No murmur heard. Pulmonary:     Effort: Pulmonary effort is normal. No respiratory distress.     Breath sounds: Normal breath sounds. No wheezing,  rhonchi or rales.  Abdominal:     General: Abdomen is flat. Bowel sounds are normal.     Palpations: Abdomen is soft.     Tenderness: There is no abdominal tenderness.  Musculoskeletal:        General: No swelling. Normal range of motion.     Cervical back: Normal range of motion and neck supple.     Right lower leg: No edema.     Left lower leg: No edema.  Lymphadenopathy:     Cervical: No cervical adenopathy.  Skin:    General: Skin is warm and dry.     Capillary Refill: Capillary refill takes less than 2 seconds.     Coloration: Skin is not jaundiced.     Findings: No bruising.  Neurological:     General: No focal deficit present.  Mental Status: She is alert and oriented to person, place, and time.     Motor: No weakness.     Gait: Gait normal.  Psychiatric:        Mood and Affect: Mood normal.        Behavior: Behavior normal.        Thought Content: Thought content normal.        Judgment: Judgment normal.      Assessment & Plan:   Problem List Items Addressed This Visit       Cardiovascular and Mediastinum   Essential hypertension, benign    Chronic.  Blood pressure is well controlled today in clinic.  Encouraged patient to check blood pressure at home a few times weekly.  Continue hydrochlorothiazide 12.5 mg daily-refills given.  We will check kidney function with electrolytes today.  Plan to follow-up in 6 months or sooner if needed.       Relevant Medications   hydrochlorothiazide (MICROZIDE) 12.5 MG capsule     Endocrine   Diabetes mellitus (HCC) - Primary    Chronic.  Last A1c in January 2022 was 6.3%.  Tolerating Ozempic 1 mg weekly well.  We will continue this medication as she has also had great weight loss success with it.  Will check A1c today and kidney function.  Continue with great physical activity- going to try riding the bike secondary to foot pain.  Continue increasing on water intake.  Plans to schedule eye exam.  Foot exam and urine  microalbumin are up-to-date.  We will plan to follow-up in 6 months.       Relevant Medications   Semaglutide, 1 MG/DOSE, 4 MG/3ML SOPN   Other Relevant Orders   Hemoglobin A1c   Lipid panel     Other   History of gastric surgery    In January, vitamin B-12 and vitamin D were normal.  Iron was slightly low.  We will plan to recheck blood counts today-she is taking vitamin D, iron, and vitamin B12.  Plan to keep a close eye on given history of gastric sleeve in 2017.  Follow-up in 6 months.       Relevant Orders   BASIC METABOLIC PANEL WITH GFR   CBC with Differential/Platelet   BMI 38.0-38.9,adult    Continue Ozempic 1 mg weekly.  Encourage physical activity that is nonweightbearing like biking.  Goal of physical activity 30 minutes 5 times weekly.  Increase water intake to 64 ounces daily.  Increase intake of vegetables and lean proteins.  Follow-up in 6 months.       Relevant Medications   Semaglutide, 1 MG/DOSE, 4 MG/3ML SOPN   Other Visit Diagnoses     Encounter for screening mammogram for malignant neoplasm of breast       Relevant Orders   MM Digital Screening   Need for shingles vaccine       Relevant Medications   Zoster Vaccine Adjuvanted [facility]) injection   Acute bilateral low back pain without sciatica       Continuing to improve.  Will refill Robaxin to have on hand.  Continue exercises, stretching, encouraged massage and plenty of water intake after.   Relevant Medications   methocarbamol (ROBAXIN) 500 MG tablet        Follow up plan: Return in about 6 months (around 10/25/2021) for chronic follow up.

## 2021-04-25 ENCOUNTER — Encounter: Payer: Self-pay | Admitting: Nurse Practitioner

## 2021-04-25 DIAGNOSIS — E669 Obesity, unspecified: Secondary | ICD-10-CM | POA: Insufficient documentation

## 2021-04-25 DIAGNOSIS — E785 Hyperlipidemia, unspecified: Secondary | ICD-10-CM | POA: Insufficient documentation

## 2021-04-25 DIAGNOSIS — E1169 Type 2 diabetes mellitus with other specified complication: Secondary | ICD-10-CM | POA: Insufficient documentation

## 2021-04-25 LAB — BASIC METABOLIC PANEL WITH GFR
BUN: 10 mg/dL (ref 7–25)
CO2: 29 mmol/L (ref 20–32)
Calcium: 9.4 mg/dL (ref 8.6–10.4)
Chloride: 102 mmol/L (ref 98–110)
Creat: 0.8 mg/dL (ref 0.50–1.03)
Glucose, Bld: 91 mg/dL (ref 65–99)
Potassium: 3.6 mmol/L (ref 3.5–5.3)
Sodium: 138 mmol/L (ref 135–146)
eGFR: 89 mL/min/{1.73_m2} (ref >=60)

## 2021-04-25 LAB — CBC WITH DIFFERENTIAL/PLATELET
Absolute Monocytes: 607 cells/uL (ref 200–950)
Basophils Absolute: 31 cells/uL (ref 0–200)
Basophils Relative: 0.6 %
Eosinophils Absolute: 454 cells/uL (ref 15–500)
Eosinophils Relative: 8.9 %
HCT: 38.5 % (ref 35.0–45.0)
Hemoglobin: 12.4 g/dL (ref 11.7–15.5)
Lymphs Abs: 2127 cells/uL (ref 850–3900)
MCH: 26.5 pg — ABNORMAL LOW (ref 27.0–33.0)
MCHC: 32.2 g/dL (ref 32.0–36.0)
MCV: 82.3 fL (ref 80.0–100.0)
MPV: 11.1 fL (ref 7.5–12.5)
Monocytes Relative: 11.9 %
Neutro Abs: 1882 cells/uL (ref 1500–7800)
Neutrophils Relative %: 36.9 %
Platelets: 289 10*3/uL (ref 140–400)
RBC: 4.68 10*6/uL (ref 3.80–5.10)
RDW: 13.2 % (ref 11.0–15.0)
Total Lymphocyte: 41.7 %
WBC: 5.1 10*3/uL (ref 3.8–10.8)

## 2021-04-25 LAB — LIPID PANEL
Cholesterol: 197 mg/dL (ref <200)
HDL: 40 mg/dL — ABNORMAL LOW (ref >=50)
LDL Cholesterol (Calc): 134 mg/dL (calc) — ABNORMAL HIGH
Non-HDL Cholesterol (Calc): 157 mg/dL (calc) — ABNORMAL HIGH (ref <130)
Total CHOL/HDL Ratio: 4.9 (calc) (ref <5.0)
Triglycerides: 120 mg/dL (ref <150)

## 2021-04-25 LAB — HEMOGLOBIN A1C
Hgb A1c MFr Bld: 5.5 % of total Hgb (ref <5.7)
Mean Plasma Glucose: 111 mg/dL
eAG (mmol/L): 6.2 mmol/L

## 2021-04-25 MED ORDER — ATORVASTATIN CALCIUM 10 MG PO TABS: 10.0000 mg | ORAL_TABLET | Freq: Every day | ORAL | 1 refills | Status: DC

## 2021-05-22 ENCOUNTER — Other Ambulatory Visit: Payer: Self-pay | Admitting: Nurse Practitioner

## 2021-05-22 DIAGNOSIS — M545 Low back pain, unspecified: Secondary | ICD-10-CM

## 2021-05-22 NOTE — Telephone Encounter (Signed)
Ok to refill 

## 2021-06-12 ENCOUNTER — Ambulatory Visit (INDEPENDENT_AMBULATORY_CARE_PROVIDER_SITE_OTHER): Payer: 59 | Admitting: Podiatry

## 2021-06-12 ENCOUNTER — Other Ambulatory Visit: Payer: Self-pay

## 2021-06-12 DIAGNOSIS — M722 Plantar fascial fibromatosis: Secondary | ICD-10-CM

## 2021-06-18 ENCOUNTER — Encounter: Payer: Self-pay | Admitting: Podiatry

## 2021-06-18 NOTE — Progress Notes (Signed)
Subjective:  Patient ID: Ariel [name], female    DOB: 02-11-1971,  MRN: [id number]  Chief Complaint  Patient presents with   Foot Pain    PT stated that she is still having some pain    50 y.o. female presents with the above complaint.  Patient presents with now complaining of left heel pain that seems to be hurting.  She states the peroneal tendinitis is doing a lot better.  She would like to discuss treatment for bottom of the heel pain.  She had a history of Planter fasciitis pain.  She would like to know if she can do another injection.  She denies any other acute complaints.   Review of Systems: Negative except as noted in the HPI. Denies N/V/F/Ch.  Past Medical History:  Diagnosis Date   Boil of buttock 09/12/2015   comes and goes- not at present   BV (bacterial vaginosis) 08/09/2013   03-05-16 no problems now   Diabetes mellitus without complication (HCC)    borderline   Hypertension    Obesity    Panic attack 07/2015   wakes up from sleep with panic attack   Transfusion history    25 yrs ago after childbirth -NVD   Vaginal discharge 04/15/2016   Vaginal itching 08/09/2013   no problem now   Yeast infection 04/15/2016    Current Outpatient Medications:    acetaminophen (TYLENOL) 325 MG tablet, Take 650 mg by mouth every 6 (six) hours as needed (For pain.)., Disp: , Rfl:    atorvastatin (LIPITOR) 10 MG tablet, Take 1 tablet (10 mg total) by mouth daily., Disp: 90 tablet, Rfl: 1   Calcium Carbonate-Vitamin D (CALCIUM 600/VITAMIN D) 600-400 MG-UNIT chew tablet, Chew 2 tablets by mouth daily., Disp: 60 tablet, Rfl: 0   Cyanocobalamin (VITAMIN B-12) 500 MCG SUBL, Place 1 tablet (500 mcg total) under the tongue daily., Disp: 150 tablet, Rfl:    diclofenac Sodium (VOLTAREN) 1 % GEL, Apply 2 g topically 4 (four) times daily., Disp: 2 g, Rfl: 2   esomeprazole (NEXIUM) 20 MG capsule, Take 20 mg by mouth daily as needed., Disp: , Rfl:    ferrous sulfate (SM IRON SLOW RELEASE) 160  (50 Fe) MG TBCR SR tablet, Take 1 tablet (160 mg total) by mouth daily., Disp: 30 tablet, Rfl: 2   fluticasone (FLONASE) 50 MCG/ACT nasal spray, INSTILL 2 SPRAYS INTO EACH NOSTRIL DAILY., Disp: 16 g, Rfl: 0   hydrochlorothiazide (MICROZIDE) 12.5 MG capsule, Take 1 capsule (12.5 mg total) by mouth daily., Disp: 90 capsule, Rfl: 1   methocarbamol (ROBAXIN) 500 MG tablet, TAKE (1) TABLET BY MOUTH AT BEDTIME AS NEEDED FOR MUSCLE SPASMS., Disp: 30 tablet, Rfl: 0   Multiple Vitamin (MULTIVITAMIN WITH MINERALS) TABS tablet, Take 1 tablet by mouth daily., Disp: , Rfl:    Semaglutide, 1 MG/DOSE, 4 MG/3ML SOPN, Inject 1 mg into the skin once a week., Disp: 3 mL, Rfl: 2  Social History   Tobacco Use  Smoking Status Never  Smokeless Tobacco Never    No Known Allergies Objective:  There were no vitals filed for this visit. There is no height or weight on file to calculate BMI. Constitutional Well developed. Well nourished.  Vascular Dorsalis pedis pulses palpable bilaterally. Posterior tibial pulses palpable bilaterally. Capillary refill normal to all digits.  No cyanosis or clubbing noted. Pedal hair growth normal.  Neurologic Normal speech. Oriented to person, place, and time. Epicritic sensation to light touch grossly present bilaterally.  Dermatologic Nails  well groomed and normal in appearance. No open wounds. No skin lesions.  Orthopedic: Normal joint ROM without pain or crepitus bilaterally. No visible deformities. Tender to palpation at the calcaneal tuber left. No pain with calcaneal squeeze left. Ankle ROM diminished range of motion left. Silfverskiold Test: positive left.   Radiographs: None  Assessment:   1. Plantar fasciitis of left foot    Plan:  Patient was evaluated and treated and all questions answered.  Plantar Fasciitis, left - XR reviewed as above.  - Educated on icing and stretching. Instructions given.  - Injection delivered to the plantar fascia as  below. - DME: Plantar Fascial Brace - Pharmacologic management: None  Procedure: Injection Tendon/Ligament Location: Left plantar fascia at the glabrous junction; medial approach. Skin Prep: alcohol Injectate: 0.5 cc 0.5% marcaine plain, 0.5 cc of 1% Lidocaine, 0.5 cc kenalog 10. Disposition: Patient tolerated procedure well. Injection site dressed with a band-aid.  No follow-ups on file.

## 2021-07-17 ENCOUNTER — Ambulatory Visit (INDEPENDENT_AMBULATORY_CARE_PROVIDER_SITE_OTHER): Payer: 59 | Admitting: Podiatry

## 2021-07-17 ENCOUNTER — Other Ambulatory Visit: Payer: Self-pay

## 2021-07-17 ENCOUNTER — Encounter: Payer: Self-pay | Admitting: Podiatry

## 2021-07-17 DIAGNOSIS — M722 Plantar fascial fibromatosis: Secondary | ICD-10-CM

## 2021-07-17 NOTE — Progress Notes (Signed)
Subjective:  Patient ID: Ariel [name], female    DOB: 15-Sep-1971,  MRN: [id number]  Chief Complaint  Patient presents with   Plantar Fasciitis    Left foot pt stated that she still has some pain in the morning     50 y.o. female presents with the above complaint.  Patient presents for follow-up to left Planter fasciitis.  She states that she is doing a lot better.  She still has residual pain and seems to be very painful.  She would like to discuss further treatment options.  She denies any other acute complaints.   Review of Systems: Negative except as noted in the HPI. Denies N/V/F/Ch.  Past Medical History:  Diagnosis Date   Boil of buttock 09/12/2015   comes and goes- not at present   BV (bacterial vaginosis) 08/09/2013   03-05-16 no problems now   Diabetes mellitus without complication (HCC)    borderline   Hypertension    Obesity    Panic attack 07/2015   wakes up from sleep with panic attack   Transfusion history    25 yrs ago after childbirth -NVD   Vaginal discharge 04/15/2016   Vaginal itching 08/09/2013   no problem now   Yeast infection 04/15/2016    Current Outpatient Medications:    acetaminophen (TYLENOL) 325 MG tablet, Take 650 mg by mouth every 6 (six) hours as needed (For pain.)., Disp: , Rfl:    atorvastatin (LIPITOR) 10 MG tablet, Take 1 tablet (10 mg total) by mouth daily., Disp: 90 tablet, Rfl: 1   Calcium Carbonate-Vitamin D (CALCIUM 600/VITAMIN D) 600-400 MG-UNIT chew tablet, Chew 2 tablets by mouth daily., Disp: 60 tablet, Rfl: 0   Cyanocobalamin (VITAMIN B-12) 500 MCG SUBL, Place 1 tablet (500 mcg total) under the tongue daily., Disp: 150 tablet, Rfl:    diclofenac Sodium (VOLTAREN) 1 % GEL, Apply 2 g topically 4 (four) times daily., Disp: 2 g, Rfl: 2   esomeprazole (NEXIUM) 20 MG capsule, Take 20 mg by mouth daily as needed., Disp: , Rfl:    ferrous sulfate (SM IRON SLOW RELEASE) 160 (50 Fe) MG TBCR SR tablet, Take 1 tablet (160 mg total) by mouth  daily., Disp: 30 tablet, Rfl: 2   fluticasone (FLONASE) 50 MCG/ACT nasal spray, INSTILL 2 SPRAYS INTO EACH NOSTRIL DAILY., Disp: 16 g, Rfl: 0   hydrochlorothiazide (MICROZIDE) 12.5 MG capsule, Take 1 capsule (12.5 mg total) by mouth daily., Disp: 90 capsule, Rfl: 1   methocarbamol (ROBAXIN) 500 MG tablet, TAKE (1) TABLET BY MOUTH AT BEDTIME AS NEEDED FOR MUSCLE SPASMS., Disp: 30 tablet, Rfl: 0   Multiple Vitamin (MULTIVITAMIN WITH MINERALS) TABS tablet, Take 1 tablet by mouth daily., Disp: , Rfl:    Semaglutide, 1 MG/DOSE, 4 MG/3ML SOPN, Inject 1 mg into the skin once a week., Disp: 3 mL, Rfl: 2  Social History   Tobacco Use  Smoking Status Never  Smokeless Tobacco Never    No Known Allergies Objective:  There were no vitals filed for this visit. There is no height or weight on file to calculate BMI. Constitutional Well developed. Well nourished.  Vascular Dorsalis pedis pulses palpable bilaterally. Posterior tibial pulses palpable bilaterally. Capillary refill normal to all digits.  No cyanosis or clubbing noted. Pedal hair growth normal.  Neurologic Normal speech. Oriented to person, place, and time. Epicritic sensation to light touch grossly present bilaterally.  Dermatologic Nails well groomed and normal in appearance. No open wounds. No skin lesions.  Orthopedic: Normal  joint ROM without pain or crepitus bilaterally. No visible deformities. Tender to palpation at the calcaneal tuber left. No pain with calcaneal squeeze left. Ankle ROM diminished range of motion left. Silfverskiold Test: positive left.   Radiographs: None  Assessment:   1. Plantar fasciitis of left foot     Plan:  Patient was evaluated and treated and all questions answered.  Plantar Fasciitis, left - XR reviewed as above.  - Educated on icing and stretching. Instructions given.  -Second injection delivered to the plantar fascia as below. - DME: Plantar Fascial Brace - Pharmacologic  management: None  Procedure: Injection Tendon/Ligament Location: Left plantar fascia at the glabrous junction; medial approach. Skin Prep: alcohol Injectate: 0.5 cc 0.5% marcaine plain, 0.5 cc of 1% Lidocaine, 0.5 cc kenalog 10. Disposition: Patient tolerated procedure well. Injection site dressed with a band-aid.  No follow-ups on file.

## 2021-07-22 ENCOUNTER — Other Ambulatory Visit: Payer: Self-pay | Admitting: Nurse Practitioner

## 2021-07-22 DIAGNOSIS — E119 Type 2 diabetes mellitus without complications: Secondary | ICD-10-CM

## 2021-07-22 DIAGNOSIS — Z6838 Body mass index (BMI) 38.0-38.9, adult: Secondary | ICD-10-CM

## 2021-08-15 ENCOUNTER — Inpatient Hospital Stay (HOSPITAL_COMMUNITY): Admission: RE | Admit: 2021-08-15 | Payer: 59 | Source: Ambulatory Visit

## 2021-08-27 ENCOUNTER — Encounter: Payer: Self-pay | Admitting: Orthopedic Surgery

## 2021-08-27 ENCOUNTER — Ambulatory Visit: Payer: 59

## 2021-08-27 ENCOUNTER — Ambulatory Visit (INDEPENDENT_AMBULATORY_CARE_PROVIDER_SITE_OTHER): Payer: 59 | Admitting: Orthopedic Surgery

## 2021-08-27 ENCOUNTER — Other Ambulatory Visit: Payer: Self-pay

## 2021-08-27 VITALS — BP 146/81 | HR 98 | Ht 65.5 in | Wt 217.0 lb

## 2021-08-27 DIAGNOSIS — M25562 Pain in left knee: Secondary | ICD-10-CM

## 2021-08-27 DIAGNOSIS — M7122 Synovial cyst of popliteal space [Baker], left knee: Secondary | ICD-10-CM

## 2021-08-27 NOTE — Progress Notes (Signed)
New Patient Visit  Assessment: Ariel [name] is a 50 y.o. female with the following: 1. Acute pain of left knee 2. Baker's cyst of knee, left  Plan: Patient has acute onset of pain for the past couple of months.  Description of the pain is vague.  She does have some tightness in the posterior aspect of her knee.  On physical exam, she does have a cyst, but this is not tender to palpation.  On radiographs, she is a varus alignment overall with mild degenerative changes noted within the medial compartment, as well as the patellofemoral compartment.  We discussed multiple treatment options, including medications, home exercises, bracing and injection if her symptoms persist.  She will continue with her current level of treatment.  If her pain worsens, she will return for repeat evaluation.   Follow-up: Return if symptoms worsen or fail to improve.  Subjective:  Chief Complaint  Patient presents with   Knee Pain    Lt knee pain for 1-2 months getting worse. Has tried some medications but doesn't feel they helped much.     History of Present Illness: Ariel [name] is a 50 y.o. female who presents for evaluation of her left knee.  She states that its been bothering her for the past 1-2 months.  No specific injury.  The pain is diffuse.  No specific point tenderness.  She does note some tightness and fullness in the posterior aspect of her left knee, especially when she tries to straighten her knee.  She is taking over-the-counter medications, with limited improvement in her symptoms.  No physical therapy.  No prior injections.   Review of Systems: No fevers or chills No numbness or tingling No chest pain No shortness of breath No bowel or bladder dysfunction No GI distress No headaches   Medical History:  Past Medical History:  Diagnosis Date   Boil of buttock 09/12/2015   comes and goes- not at present   BV (bacterial vaginosis) 08/09/2013   03-05-16 no problems now    Diabetes mellitus without complication (HCC)    borderline   Hypertension    Obesity    Panic attack 07/2015   wakes up from sleep with panic attack   Transfusion history    25 yrs ago after childbirth -NVD   Vaginal discharge 04/15/2016   Vaginal itching 08/09/2013   no problem now   Yeast infection 04/15/2016    Past Surgical History:  Procedure Laterality Date   LAPAROSCOPIC GASTRIC SLEEVE RESECTION N/A 03/17/2016   Procedure: LAPAROSCOPIC GASTRIC SLEEVE RESECTION WITH UPPER ENDOSCOPY;  Surgeon: Ovidio Kin, MD;  Location: [facility];  Service: General;  Laterality: N/A;   TUBAL LIGATION     UPPER GI ENDOSCOPY  03/17/2016   Procedure: UPPER GI ENDOSCOPY;  Surgeon: Ovidio Kin, MD;  Location: [facility];  Service: General;;   WISDOM TOOTH EXTRACTION      Family History  Problem Relation Age of Onset   Hypertension Mother    Diabetes Father    Hypertension Father    Social History   Tobacco Use   Smoking status: Never   Smokeless tobacco: Never  Vaping Use   Vaping Use: Never used  Substance Use Topics   Alcohol use: No    Comment: rare -occ   Drug use: No    No Known Allergies  Current Meds  Medication Sig   acetaminophen (TYLENOL) 325 MG tablet Take 650 mg by mouth every 6 (six) hours as needed (For pain.).  atorvastatin (LIPITOR) 10 MG tablet Take 1 tablet (10 mg total) by mouth daily.   Calcium Carbonate-Vitamin D (CALCIUM 600/VITAMIN D) 600-400 MG-UNIT chew tablet Chew 2 tablets by mouth daily.   Cyanocobalamin (VITAMIN B-12) 500 MCG SUBL Place 1 tablet (500 mcg total) under the tongue daily.   diclofenac Sodium (VOLTAREN) 1 % GEL Apply 2 g topically 4 (four) times daily.   esomeprazole (NEXIUM) 20 MG capsule Take 20 mg by mouth daily as needed.   ferrous sulfate (SM IRON SLOW RELEASE) 160 (50 Fe) MG TBCR SR tablet Take 1 tablet (160 mg total) by mouth daily.   fluticasone (FLONASE) 50 MCG/ACT nasal spray INSTILL 2 SPRAYS INTO EACH NOSTRIL DAILY.   hydrochlorothiazide  (MICROZIDE) 12.5 MG capsule Take 1 capsule (12.5 mg total) by mouth daily.   methocarbamol (ROBAXIN) 500 MG tablet TAKE (1) TABLET BY MOUTH AT BEDTIME AS NEEDED FOR MUSCLE SPASMS.   Multiple Vitamin (MULTIVITAMIN WITH MINERALS) TABS tablet Take 1 tablet by mouth daily.   OZEMPIC, 1 MG/DOSE, 4 MG/3ML SOPN INJECT (1)MG INTO THE SKIN WEEKLY    Objective: BP (!) 146/81   Pulse 98   Ht 5' 5.5" (1.664 m)   Wt 217 lb (98.4 kg)   BMI 35.56 kg/m   Physical Exam:  General: Alert and oriented. and No acute distress. Gait: Left sided antalgic gait.  Evaluation left knee demonstrates no effusion.  No bruising is appreciated.  No tenderness palpation along the medial or lateral joint line.  She does have a cyst in the posterior aspect of her knee.  This is not tender to palpation.  Easily achieves full extension.  Negative Lachman.  Flexion beyond 110 degrees.  Varus alignment overall  IMAGING: I personally ordered and reviewed the following images  X-rays of the left knee were obtained in clinic today and demonstrates a mild varus alignment overall.  She has some joint space narrowing within the medial compartment.  Small osteophytes are appreciated.  She also has some osteophytes within the patellofemoral compartment.  Impression: Left knee x-rays in varus alignment, with mild degenerative changes.   New Medications:  No orders of the defined types were placed in this encounter.     Oliver Barre, MD  08/27/2021 11:18 AM

## 2021-09-11 ENCOUNTER — Other Ambulatory Visit: Payer: Self-pay | Admitting: Nurse Practitioner

## 2021-09-11 DIAGNOSIS — E119 Type 2 diabetes mellitus without complications: Secondary | ICD-10-CM

## 2021-09-11 DIAGNOSIS — M545 Low back pain, unspecified: Secondary | ICD-10-CM

## 2021-09-11 DIAGNOSIS — Z6838 Body mass index (BMI) 38.0-38.9, adult: Secondary | ICD-10-CM

## 2021-10-10 ENCOUNTER — Ambulatory Visit (HOSPITAL_COMMUNITY)
Admission: RE | Admit: 2021-10-10 | Discharge: 2021-10-10 | Disposition: A | Discharge status: Home / Self Care | Payer: 59 | Source: Ambulatory Visit | Attending: Nurse Practitioner | Admitting: Nurse Practitioner

## 2021-10-10 ENCOUNTER — Other Ambulatory Visit: Payer: Self-pay

## 2021-10-10 DIAGNOSIS — Z1231 Encounter for screening mammogram for malignant neoplasm of breast: Secondary | ICD-10-CM | POA: Diagnosis present

## 2021-10-14 ENCOUNTER — Ambulatory Visit: Payer: 59 | Admitting: Podiatry

## 2021-11-10 NOTE — Progress Notes (Signed)
(  Key: [id number])  This request has received an approval. View the bottom of the request for an electronic copy of the approval letter.

## 2021-11-11 NOTE — Progress Notes (Signed)
PA for Ozempic has been APPROVED 11/10/21-11/09/24

## 2021-11-24 ENCOUNTER — Ambulatory Visit
Admission: EM | Admit: 2021-11-24 | Discharge: 2021-11-24 | Disposition: A | Discharge status: Home / Self Care | Payer: 59

## 2021-11-24 ENCOUNTER — Other Ambulatory Visit: Payer: Self-pay

## 2021-11-24 NOTE — ED Triage Notes (Signed)
Pt reports she being hoarse x 3 days; cough x 3 weeks.

## 2021-11-24 NOTE — ED Provider Notes (Signed)
Patient left without being seen.  Unfortunately I had 2 other medical emergencies that came in and needed urgent attention.   Jaynee Eagles, PA-C 11/24/21 1037

## 2021-12-15 ENCOUNTER — Other Ambulatory Visit: Payer: Self-pay | Admitting: Nurse Practitioner

## 2021-12-15 DIAGNOSIS — E119 Type 2 diabetes mellitus without complications: Secondary | ICD-10-CM

## 2021-12-15 DIAGNOSIS — Z6838 Body mass index (BMI) 38.0-38.9, adult: Secondary | ICD-10-CM

## 2022-01-08 ENCOUNTER — Encounter: Payer: Self-pay | Admitting: Family Medicine

## 2022-01-08 ENCOUNTER — Ambulatory Visit: Payer: 59 | Admitting: Family Medicine

## 2022-01-08 ENCOUNTER — Ambulatory Visit (INDEPENDENT_AMBULATORY_CARE_PROVIDER_SITE_OTHER): Payer: 59 | Admitting: Family Medicine

## 2022-01-08 VITALS — BP 132/84 | HR 82 | Temp 97.7°F | Ht 65.5 in | Wt 217.0 lb

## 2022-01-08 DIAGNOSIS — K219 Gastro-esophageal reflux disease without esophagitis: Secondary | ICD-10-CM | POA: Diagnosis not present

## 2022-01-08 DIAGNOSIS — E785 Hyperlipidemia, unspecified: Secondary | ICD-10-CM

## 2022-01-08 DIAGNOSIS — E669 Obesity, unspecified: Secondary | ICD-10-CM | POA: Diagnosis not present

## 2022-01-08 DIAGNOSIS — E1169 Type 2 diabetes mellitus with other specified complication: Secondary | ICD-10-CM | POA: Diagnosis not present

## 2022-01-08 DIAGNOSIS — I152 Hypertension secondary to endocrine disorders: Secondary | ICD-10-CM | POA: Diagnosis not present

## 2022-01-08 DIAGNOSIS — Z9889 Other specified postprocedural states: Secondary | ICD-10-CM

## 2022-01-08 DIAGNOSIS — E1159 Type 2 diabetes mellitus with other circulatory complications: Secondary | ICD-10-CM

## 2022-01-08 LAB — CBC WITH DIFFERENTIAL/PLATELET
Basophils Absolute: 0 10*3/uL (ref 0.0–0.1)
Basophils Relative: 0.6 % (ref 0.0–3.0)
Eosinophils Absolute: 0.2 10*3/uL (ref 0.0–0.7)
Eosinophils Relative: 3.3 % (ref 0.0–5.0)
HCT: 37.6 % (ref 36.0–46.0)
Hemoglobin: 12.6 g/dL (ref 12.0–15.0)
Lymphocytes Relative: 38.5 % (ref 12.0–46.0)
Lymphs Abs: 1.9 10*3/uL (ref 0.7–4.0)
MCHC: 33.5 g/dL (ref 30.0–36.0)
MCV: 81.2 fl (ref 78.0–100.0)
Monocytes Absolute: 0.5 10*3/uL (ref 0.1–1.0)
Monocytes Relative: 10.7 % (ref 3.0–12.0)
Neutro Abs: 2.3 10*3/uL (ref 1.4–7.7)
Neutrophils Relative %: 46.9 % (ref 43.0–77.0)
Platelets: 287 10*3/uL (ref 150.0–400.0)
RBC: 4.63 Mil/uL (ref 3.87–5.11)
RDW: 13.6 % (ref 11.5–15.5)
WBC: 4.9 10*3/uL (ref 4.0–10.5)

## 2022-01-08 LAB — HEMOGLOBIN A1C: Hgb A1c MFr Bld: 5.8 % (ref 4.6–6.5)

## 2022-01-08 LAB — COMPREHENSIVE METABOLIC PANEL
ALT: 16 U/L (ref 0–35)
AST: 21 U/L (ref 0–37)
Albumin: 4.2 g/dL (ref 3.5–5.2)
Alkaline Phosphatase: 62 U/L (ref 39–117)
BUN: 10 mg/dL (ref 6–23)
CO2: 30 mEq/L (ref 19–32)
Calcium: 9.6 mg/dL (ref 8.4–10.5)
Chloride: 101 mEq/L (ref 96–112)
Creatinine, Ser: 0.81 mg/dL (ref 0.40–1.20)
GFR: 83.57 mL/min (ref >60.00)
Glucose, Bld: 89 mg/dL (ref 70–99)
Potassium: 3.6 mEq/L (ref 3.5–5.1)
Sodium: 138 mEq/L (ref 135–145)
Total Bilirubin: 0.6 mg/dL (ref 0.2–1.2)
Total Protein: 7.6 g/dL (ref 6.0–8.3)

## 2022-01-08 LAB — LIPID PANEL
Cholesterol: 214 mg/dL — ABNORMAL HIGH (ref 0–200)
HDL: 49.4 mg/dL (ref >39.00)
LDL Cholesterol: 138 mg/dL — ABNORMAL HIGH (ref 0–99)
NonHDL: 164.2
Total CHOL/HDL Ratio: 4
Triglycerides: 131 mg/dL (ref 0.0–149.0)
VLDL: 26.2 mg/dL (ref 0.0–40.0)

## 2022-01-08 LAB — MICROALBUMIN / CREATININE URINE RATIO
Creatinine,U: 304.8 mg/dL
Microalb Creat Ratio: 0.5 mg/g (ref 0.0–30.0)
Microalb, Ur: 1.6 mg/dL (ref 0.0–1.9)

## 2022-01-08 LAB — TSH: TSH: 1.08 u[IU]/mL (ref 0.35–5.50)

## 2022-01-08 NOTE — Patient Instructions (Signed)
It was a pleasure meeting you today.  Thank you for trusting [place] with your health care. ? ?Go downstairs for lab work before you leave today. ? ?I recommend that you call and schedule your colonoscopy as has been recommended. ? ?I also recommend you call and schedule a diabetic eye exam. ? ?Try to get at least 150 minutes of physical activity each week.  You can do low impact exercises to prevent knee pain from worsening. ? ?Continue on your current medications for now. ? ?I will be in touch with your lab results. ?

## 2022-01-08 NOTE — Progress Notes (Signed)
?Subjective:  ? ? Patient ID: Ariel [name], female    DOB: 13-Mar-1971, 51 y.o.   MRN: [id number] ? ?She is new to the practice and here to establish care. ? ?Other providers:  ?[facility]  ?Orthopedist ?Eye doctor ? ?She had a pop tart this morning, approximately 3 and half hours before her visit ? ?Ariel [name] is a 51 y.o. female who presents for follow-up of Type 2 diabetes mellitus. ? ?She also has HTN and is taking HCTZ daily without any concerns.  She does not check her blood pressure at home. ? ?States she is taking a statin for cholesterol ? ?She had weight loss surgery, gastric sleeve 6 years ago.  ? ?She has an orthopedist for chronic knee pain. ? ?GERD- takes Nexium as needed.  ? ?States she had a colonoscopy and states she is due for another.  ? ? ? ?Patient is not checking home blood sugars.   ?Home blood sugar records: patient does not check sugars ?How often is blood sugars being checked: N/A ?Current symptoms include: none. Patient denies foot ulcerations, increased appetite, nausea, paresthesia of the feet, polydipsia, polyuria, visual disturbances, and vomiting.  ?Patient is not checking their feet daily. ?Any Foot concerns (callous, ulcer, wound, thickened nails, toenail fungus, skin fungus, hammer toe): No ?Last dilated eye exam: pt says it has been a long time, needs one done ? ?Current treatments: no recent interventions. Did use Ozempic, but hasnt been taking it for a while. She has been out. Starting back on it this week.  ?She took metformin prior to weight loss surgery ?Medication compliance: adequate  ?Current diet: on average, 3 meals per day ?Current exercise: none ?Known diabetic complications: none ? ?The following portions of the patient's history were reviewed and updated as appropriate: allergies, current medications, past medical history, past social history and problem list. ? ?ROS as in subjective above. ? ?   ?Objective:  ?  ?Physical Exam ?Alert and in no  distress. Cardiac exam shows a regular rate and rhythm.  Lungs are clear to auscultation. Extremities without edema. Skin is warm and dry.  ? ? ?Blood pressure 132/84, pulse 82, temperature 97.7 ?F (36.5 ?C), temperature source Temporal, height 5' 5.5" (1.664 m), weight 217 lb (98.4 kg), SpO2 98 %. ? ?Lab Review ? ?  Latest Ref Rng & Units 04/24/2021  ?  8:31 AM 02/13/2021  ? 10:50 AM 01/20/2021  ? 11:30 AM 11/01/2020  ? 12:03 PM 05/10/2019  ?  8:33 AM  ?Diabetic Labs  ?HbA1c <5.7 % of total Hgb 5.5     6.3   5.8    ?Microalbumin mg/dL    0.9     ?Micro/Creat Ratio <30 mcg/mg creat    4     ?Chol <200 mg/dL 270     786   754    ?HDL > OR = 50 mg/dL 40     42   43    ?Calc LDL mg/dL (calc) 492     010   98    ?Triglycerides <150 mg/dL 071     219   758    ?Creatinine 0.50 - 1.03 mg/dL 8.32   5.49   8.26   4.15   0.71    ? ? ?  01/08/2022  ?  9:27 AM 11/24/2021  ?  9:55 AM 08/27/2021  ?  9:00 AM 04/24/2021  ?  8:13 AM 02/13/2021  ? 10:21 AM  ?BP/Weight  ?Systolic BP 132  152 146 124 136  ?Diastolic BP 84 89 81 80 80  ?Wt. (Lbs) 217  217 216.2 226.6  ?BMI 35.56 kg/m2  35.56 kg/m2 35.98 kg/m2 37.71 kg/m2  ? ? ?  10/07/2020  ? 12:30 PM  ?Foot/eye exam completion dates  ?Foot Form Completion Done  ? ? ?Ariel [name]  reports that she has never smoked. She has never used smokeless tobacco. She reports that she does not drink alcohol and does not use drugs. ? ?   ?Assessment & Plan:  ?  ?Diabetes mellitus type 2 in obese (HCC) - Plan: CBC with Differential/Platelet, Comprehensive metabolic panel, TSH, Hemoglobin A1c, Microalbumin / creatinine urine ratio ? ?Hyperlipidemia associated with type 2 diabetes mellitus (HCC) - Plan: Lipid panel ? ?Hypertension associated with type 2 diabetes mellitus (HCC) - Plan: CBC with Differential/Platelet, Comprehensive metabolic panel ? ?History of gastric surgery ? ?Gastroesophageal reflux disease without esophagitis ? ?Rx changes:  she plans to start on Ozempic, prescription received by her previous PCP.   ?Education: Reviewed ?ABCs? of diabetes management (respective goals in parentheses):  A1C (<7), blood pressure (<130/80), and cholesterol (LDL <100). ?HTN- continue HCTZ for now. BP controlled. Low sodium diet and exercise recommended.  ?Continue statin therapy. Check lipid panel.  ?GERD- continue to avoid triggers, overeating and laying down after eating. Take PPI as needed.  ?Compliance at present is estimated to be good. Efforts to improve compliance (if necessary) will be directed at dietary modifications: limit carbohydrates and sugar, increased exercise, regular blood sugar monitoring: declines for now, and schedule a diabetic eye exam . ?Follow up: 4 months for diabetes or sooner if labs indicate.  ? ? ? ? ?  ? ? ?

## 2022-01-09 ENCOUNTER — Encounter: Payer: Self-pay | Admitting: Family Medicine

## 2022-01-12 NOTE — Telephone Encounter (Signed)
Patient is requesting orders to test for UTI. Sx include vaginal itching and discharge.  ?

## 2022-01-14 NOTE — Telephone Encounter (Signed)
Called pt to relay MyChart message below. Pt states she will probably just go to her OBGYN to get it looked at/treated. I let her know if she changes her mind she can give our office a call to schedule.  ?

## 2022-02-05 ENCOUNTER — Other Ambulatory Visit: Payer: Self-pay | Admitting: Nurse Practitioner

## 2022-02-05 DIAGNOSIS — E119 Type 2 diabetes mellitus without complications: Secondary | ICD-10-CM

## 2022-02-05 DIAGNOSIS — Z6838 Body mass index (BMI) 38.0-38.9, adult: Secondary | ICD-10-CM

## 2022-03-12 ENCOUNTER — Other Ambulatory Visit: Payer: Self-pay

## 2022-03-12 ENCOUNTER — Encounter: Payer: Self-pay | Admitting: Family Medicine

## 2022-03-12 DIAGNOSIS — E119 Type 2 diabetes mellitus without complications: Secondary | ICD-10-CM

## 2022-03-12 DIAGNOSIS — Z6838 Body mass index (BMI) 38.0-38.9, adult: Secondary | ICD-10-CM

## 2022-03-12 MED ORDER — OZEMPIC (1 MG/DOSE) 4 MG/3ML ~~LOC~~ SOPN: PEN_INJECTOR | SUBCUTANEOUS | 0 refills | Status: DC

## 2022-05-13 ENCOUNTER — Encounter: Payer: Self-pay | Admitting: Family Medicine

## 2022-05-13 ENCOUNTER — Other Ambulatory Visit: Payer: Self-pay | Admitting: Family Medicine

## 2022-05-13 ENCOUNTER — Ambulatory Visit (INDEPENDENT_AMBULATORY_CARE_PROVIDER_SITE_OTHER): Payer: 59 | Admitting: Family Medicine

## 2022-05-13 VITALS — BP 128/84 | HR 91 | Temp 97.6°F | Ht 65.5 in | Wt 221.0 lb

## 2022-05-13 DIAGNOSIS — E119 Type 2 diabetes mellitus without complications: Secondary | ICD-10-CM

## 2022-05-13 DIAGNOSIS — E1169 Type 2 diabetes mellitus with other specified complication: Secondary | ICD-10-CM | POA: Diagnosis not present

## 2022-05-13 DIAGNOSIS — Z6838 Body mass index (BMI) 38.0-38.9, adult: Secondary | ICD-10-CM

## 2022-05-13 DIAGNOSIS — E785 Hyperlipidemia, unspecified: Secondary | ICD-10-CM | POA: Diagnosis not present

## 2022-05-13 DIAGNOSIS — I10 Essential (primary) hypertension: Secondary | ICD-10-CM | POA: Diagnosis not present

## 2022-05-13 DIAGNOSIS — E876 Hypokalemia: Secondary | ICD-10-CM | POA: Insufficient documentation

## 2022-05-13 DIAGNOSIS — Z1211 Encounter for screening for malignant neoplasm of colon: Secondary | ICD-10-CM | POA: Diagnosis not present

## 2022-05-13 LAB — LIPID PANEL
Cholesterol: 139 mg/dL (ref 0–200)
HDL: 39.1 mg/dL (ref >39.00)
LDL Cholesterol: 81 mg/dL (ref 0–99)
NonHDL: 99.63
Total CHOL/HDL Ratio: 4
Triglycerides: 92 mg/dL (ref 0.0–149.0)
VLDL: 18.4 mg/dL (ref 0.0–40.0)

## 2022-05-13 LAB — HEMOGLOBIN A1C: Hgb A1c MFr Bld: 5.8 % (ref 4.6–6.5)

## 2022-05-13 LAB — TSH: TSH: 1.07 u[IU]/mL (ref 0.35–5.50)

## 2022-05-13 LAB — BASIC METABOLIC PANEL
BUN: 10 mg/dL (ref 6–23)
CO2: 28 mEq/L (ref 19–32)
Calcium: 9.1 mg/dL (ref 8.4–10.5)
Chloride: 101 mEq/L (ref 96–112)
Creatinine, Ser: 0.79 mg/dL (ref 0.40–1.20)
GFR: 85.91 mL/min (ref >60.00)
Glucose, Bld: 86 mg/dL (ref 70–99)
Potassium: 3.3 mEq/L — ABNORMAL LOW (ref 3.5–5.1)
Sodium: 137 mEq/L (ref 135–145)

## 2022-05-13 MED ORDER — POTASSIUM CHLORIDE CRYS ER 10 MEQ PO TBCR: 10.0000 meq | EXTENDED_RELEASE_TABLET | Freq: Every day | ORAL | 2 refills | Status: DC

## 2022-05-13 NOTE — Patient Instructions (Signed)
Please go downstairs for labs before you leave today.    Cholesterol Content in Foods Cholesterol is a waxy, fat-like substance that helps to carry fat in the blood. The body needs cholesterol in small amounts, but too much cholesterol can cause damage to the arteries and heart. What foods have cholesterol?  Cholesterol is found in animal-based foods, such as meat, seafood, and dairy. Generally, low-fat dairy and lean meats have less cholesterol than full-fat dairy and fatty meats. The milligrams of cholesterol per serving (mg per serving) of common cholesterol-containing foods are listed below. Meats and other proteins Egg -- one large whole egg has 186 mg. Veal shank -- 4 oz (113 g) has 141 mg. Lean ground [place] (93% lean) -- 4 oz (113 g) has 118 mg. Fat-trimmed lamb loin -- 4 oz (113 g) has 106 mg. Lean ground beef (90% lean) -- 4 oz (113 g) has 100 mg. Lobster -- 3.5 oz (99 g) has 90 mg. Pork loin chops -- 4 oz (113 g) has 86 mg. Canned salmon -- 3.5 oz (99 g) has 83 mg. Fat-trimmed beef top loin -- 4 oz (113 g) has 78 mg. Frankfurter -- 1 frank (3.5 oz or 99 g) has 77 mg. Crab -- 3.5 oz (99 g) has 71 mg. Roasted chicken without skin, white meat -- 4 oz (113 g) has 66 mg. Light bologna -- 2 oz (57 g) has 45 mg. Deli-cut [place] -- 2 oz (57 g) has 31 mg. Canned tuna -- 3.5 oz (99 g) has 31 mg. Tomasa Blase -- 1 oz (28 g) has 29 mg. Oysters and mussels (raw) -- 3.5 oz (99 g) has 25 mg. Mackerel -- 1 oz (28 g) has 22 mg. Trout -- 1 oz (28 g) has 20 mg. Pork sausage -- 1 link (1 oz or 28 g) has 17 mg. Salmon -- 1 oz (28 g) has 16 mg. Tilapia -- 1 oz (28 g) has 14 mg. Dairy Soft-serve ice cream --  cup (4 oz or 86 g) has 103 mg. Whole-milk yogurt -- 1 cup (8 oz or 245 g) has 29 mg. Cheddar cheese -- 1 oz (28 g) has 28 mg. American cheese -- 1 oz (28 g) has 28 mg. Whole milk -- 1 cup (8 oz or 250 mL) has 23 mg. 2% milk -- 1 cup (8 oz or 250 mL) has 18 mg. Cream cheese -- 1 tablespoon  (Tbsp) (14.5 g) has 15 mg. Cottage cheese --  cup (4 oz or 113 g) has 14 mg. Low-fat (1%) milk -- 1 cup (8 oz or 250 mL) has 10 mg. Sour cream -- 1 Tbsp (12 g) has 8.5 mg. Low-fat yogurt -- 1 cup (8 oz or 245 g) has 8 mg. Nonfat Greek yogurt -- 1 cup (8 oz or 228 g) has 7 mg. Half-and-half cream -- 1 Tbsp (15 mL) has 5 mg. Fats and oils Cod liver oil -- 1 tablespoon (Tbsp) (13.6 g) has 82 mg. Butter -- 1 Tbsp (14 g) has 15 mg. Lard -- 1 Tbsp (12.8 g) has 14 mg. Bacon grease -- 1 Tbsp (12.9 g) has 14 mg. Mayonnaise -- 1 Tbsp (13.8 g) has 5-10 mg. Margarine -- 1 Tbsp (14 g) has 3-10 mg. The items listed above may not be a complete list of foods with cholesterol. Exact amounts of cholesterol in these foods may vary depending on specific ingredients and brands. Contact a dietitian for more information. What foods do not have cholesterol? Most plant-based foods do  not have cholesterol unless you combine them with a food that has cholesterol. Foods without cholesterol include: Grains and cereals. Vegetables. Fruits. Vegetable oils, such as olive, canola, and sunflower oil. Legumes, such as peas, beans, and lentils. Nuts and seeds. Egg whites. The items listed above may not be a complete list of foods that do not have cholesterol. Contact a dietitian for more information. Summary The body needs cholesterol in small amounts, but too much cholesterol can cause damage to the arteries and heart. Cholesterol is found in animal-based foods, such as meat, seafood, and dairy. Generally, low-fat dairy and lean meats have less cholesterol than full-fat dairy and fatty meats. This information is not intended to replace advice given to you by your health care provider. Make sure you discuss any questions you have with your health care provider. Document Revised: 02/07/2021 Document Reviewed: 02/07/2021 Elsevier Patient Education  2023 [employer].

## 2022-05-13 NOTE — Progress Notes (Signed)
Subjective:    Patient ID: MAKISHA MARRIN, female    DOB: [birth year], 51 y.o.   MRN: [id number]  ALICYN KLANN is a 51 y.o. female who presents for follow-up of Type 2 diabetes mellitus as well as other chronic health conditions.   She did not get her colonoscopy and is aware that she is overdue.  Requests referral to [facility] gastroenterology.  States she saw her OB/GYN - mammogram is UTD and pap smear UTD.  [facility]  Reports taking all of her medications daily without any concerns.  She only started back on Ozempic 4 weeks ago approximately.  She is concerned about her weight.  Overdue for eye exam  Patient is not checking home blood sugars.   Home blood sugar records: patient does not check sugars How often is blood sugars being checked: N/A Current symptoms include: none. Patient denies  symptoms .  Patient is not checking their feet daily. Any Foot concerns (callous, ulcer, wound, thickened nails, toenail fungus, skin fungus, hammer toe): none Last dilated eye exam: 2021  Current treatments:  started back on ozempic 4 weeks approximately . Medication compliance: good  Current diet: well balanced Current exercise:  nothing regular. Foot pain  Known diabetic complications: none  The following portions of the patient's history were reviewed and updated as appropriate: allergies, current medications, past medical history, past social history and problem list.  ROS as in subjective above.     Objective:    Physical Exam Alert and in no distress otherwise not examined.  Diabetic foot exam done and normal.  Blood pressure 128/84, pulse 91, temperature 97.6 F (36.4 C), temperature source Temporal, height 5' 5.5" (1.664 m), weight 221 lb (100.2 kg), SpO2 99 %.  Lab Review    Latest Ref Rng & Units 01/08/2022   10:01 AM 04/24/2021    8:31 AM 02/13/2021   10:50 AM 01/20/2021   11:30 AM 11/01/2020   12:03 PM  Diabetic Labs  HbA1c 4.6 - 6.5 % 5.8  5.5    6.3    Microalbumin 0.0 - 1.9 mg/dL 1.6     0.9   Micro/Creat Ratio 0.0 - 30.0 mg/g 0.5     4   Chol 0 - 200 mg/dL 387  564    332   HDL >95.18 mg/dL 84.16  40    42   Calc LDL 0 - 99 mg/dL 606  301    601   Triglycerides 0.0 - 149.0 mg/dL 093.2  355    732   Creatinine 0.40 - 1.20 mg/dL 2.02  5.42  7.06  2.37  0.70   GFR >60.00 mL/min 83.57           05/13/2022    8:13 AM 01/08/2022    9:27 AM 11/24/2021    9:55 AM 08/27/2021    9:00 AM 04/24/2021    8:13 AM  BP/Weight  Systolic BP 128 132 152 146 124  Diastolic BP 84 84 89 81 80  Wt. (Lbs) 221 217  217 216.2  BMI 36.22 kg/m2 35.56 kg/m2  35.56 kg/m2 35.98 kg/m2      05/13/2022    8:00 AM 10/07/2020   12:30 PM  Foot/eye exam completion dates  Foot Form Completion Done Done    Frannie  reports that she has never smoked. She has never used smokeless tobacco. She reports that she does not drink alcohol and does not use drugs.     Assessment & Plan:  Type 2 diabetes mellitus without complication, without long-term current use of insulin (HCC) - Plan: Hemoglobin A1c, TSH, Basic metabolic panel  Hyperlipidemia associated with type 2 diabetes mellitus (HCC) - Plan: Lipid panel  Essential hypertension, benign - Plan: Basic metabolic panel  Screen for colon cancer - Plan: Ambulatory referral to Gastroenterology  BMI 38.0-38.9,adult  Rx changes: none continue on Ozempic.  Reports being on this medication for 4 weeks only.  Consider increasing atorvastatin if LDL >100 Education: Reviewed 'ABCs' of diabetes management (respective goals in parentheses):  A1C (<7), blood pressure (<130/80), and cholesterol (LDL <100). Compliance at present is estimated to be fair. Efforts to improve compliance (if necessary) will be directed at dietary modifications: specifically limiting foods high in fat, carbohydrates and sugar and increased exercise. Hypertension-continue HCTZ 12.5 mg daily Diabetic foot exam done today. She is overdue for diabetic eye  exam and she will call and schedule this. Referral to GI for screening colonoscopy which is overdue. I will attempt to get mammogram and Pap smear results from her OB/GYN office.  She reports being up-to-date on these. Follow up: 6 months or pending labs.

## 2022-05-17 ENCOUNTER — Other Ambulatory Visit: Payer: Self-pay | Admitting: Nurse Practitioner

## 2022-05-17 DIAGNOSIS — I10 Essential (primary) hypertension: Secondary | ICD-10-CM

## 2022-05-23 ENCOUNTER — Other Ambulatory Visit: Payer: Self-pay | Admitting: Internal Medicine

## 2022-05-23 ENCOUNTER — Other Ambulatory Visit: Payer: Self-pay | Admitting: Family Medicine

## 2022-05-23 DIAGNOSIS — E119 Type 2 diabetes mellitus without complications: Secondary | ICD-10-CM

## 2022-05-23 DIAGNOSIS — Z6838 Body mass index (BMI) 38.0-38.9, adult: Secondary | ICD-10-CM

## 2022-05-23 DIAGNOSIS — I10 Essential (primary) hypertension: Secondary | ICD-10-CM

## 2022-05-23 DIAGNOSIS — E1169 Type 2 diabetes mellitus with other specified complication: Secondary | ICD-10-CM

## 2022-10-09 ENCOUNTER — Other Ambulatory Visit (HOSPITAL_COMMUNITY): Payer: Self-pay | Admitting: Family Medicine

## 2022-10-09 DIAGNOSIS — Z1231 Encounter for screening mammogram for malignant neoplasm of breast: Secondary | ICD-10-CM

## 2022-10-15 ENCOUNTER — Ambulatory Visit (HOSPITAL_COMMUNITY): Payer: 59

## 2022-11-12 ENCOUNTER — Ambulatory Visit: Payer: 59 | Admitting: Family Medicine

## 2022-12-10 ENCOUNTER — Encounter: Payer: Self-pay | Admitting: Radiology

## 2023-06-04 ENCOUNTER — Telehealth: Payer: 59 | Admitting: Nurse Practitioner

## 2023-06-04 NOTE — Progress Notes (Signed)
No show for video visit, mychart message sent requesting in person appointment for blood pressure check.

## 2023-06-28 ENCOUNTER — Encounter: Payer: Self-pay | Admitting: Adult Health

## 2023-06-28 ENCOUNTER — Other Ambulatory Visit (HOSPITAL_COMMUNITY)
Admission: RE | Admit: 2023-06-28 | Discharge: 2023-06-28 | Disposition: A | Discharge status: Home / Self Care | Payer: 59 | Source: Ambulatory Visit | Attending: Adult Health | Admitting: Adult Health

## 2023-06-28 ENCOUNTER — Ambulatory Visit: Payer: 59 | Admitting: Adult Health

## 2023-06-28 ENCOUNTER — Other Ambulatory Visit (HOSPITAL_COMMUNITY): Payer: Self-pay | Admitting: Family Medicine

## 2023-06-28 VITALS — BP 171/85 | HR 76 | Ht 65.5 in | Wt 215.0 lb

## 2023-06-28 DIAGNOSIS — F419 Anxiety disorder, unspecified: Secondary | ICD-10-CM | POA: Diagnosis not present

## 2023-06-28 DIAGNOSIS — E1159 Type 2 diabetes mellitus with other circulatory complications: Secondary | ICD-10-CM | POA: Insufficient documentation

## 2023-06-28 DIAGNOSIS — N951 Menopausal and female climacteric states: Secondary | ICD-10-CM | POA: Diagnosis not present

## 2023-06-28 DIAGNOSIS — Z01419 Encounter for gynecological examination (general) (routine) without abnormal findings: Secondary | ICD-10-CM | POA: Insufficient documentation

## 2023-06-28 DIAGNOSIS — Z1211 Encounter for screening for malignant neoplasm of colon: Secondary | ICD-10-CM | POA: Diagnosis not present

## 2023-06-28 DIAGNOSIS — Z1212 Encounter for screening for malignant neoplasm of rectum: Secondary | ICD-10-CM

## 2023-06-28 DIAGNOSIS — N926 Irregular menstruation, unspecified: Secondary | ICD-10-CM

## 2023-06-28 DIAGNOSIS — Z1231 Encounter for screening mammogram for malignant neoplasm of breast: Secondary | ICD-10-CM

## 2023-06-28 DIAGNOSIS — I10 Essential (primary) hypertension: Secondary | ICD-10-CM

## 2023-06-28 LAB — HEMOCCULT GUIAC POC 1CARD (OFFICE): Fecal Occult Blood, POC: NEGATIVE

## 2023-06-28 MED ORDER — ESCITALOPRAM OXALATE 10 MG PO TABS: 10.0000 mg | ORAL_TABLET | Freq: Every day | ORAL | 3 refills | Status: DC

## 2023-06-28 MED ORDER — AMLODIPINE BESYLATE 5 MG PO TABS: 5.0000 mg | ORAL_TABLET | Freq: Every day | ORAL | 4 refills | Status: DC

## 2023-06-28 NOTE — Progress Notes (Signed)
Patient ID: Ariel [name], female   DOB: Jun 03, 1971, 52 y.o.   MRN: [id number] History of Present Illness: Ariel [name] is a 52 year old black female, single, G2P2 in for a well woman gyn exam and pap. She is having irregular periods, and can't sleep well. She feels anxious too. Occasional hot flash but not bad.  PCP is Ariel Dessert NP.   Current Medications, Allergies, Past Medical History, Past Surgical History, Family History and Social History were reviewed in [name] record.     Review of Systems: Patient denies any headaches, hearing loss, fatigue, blurred vision, shortness of breath, chest pain, abdominal pain, problems with bowel movements, urination, or intercourse. No joint pain or [name] swings.  See positives in HPI  Physical Exam:BP (!) 171/85 (BP Location: Right Arm, Patient Position: Sitting, Cuff Size: Large)   Pulse 76   Ht 5' 5.5" (1.664 m)   Wt 215 lb (97.5 kg)   LMP 05/09/2023 (Approximate)   BMI 35.23 kg/m   General:  Well developed, well nourished, no acute distress Skin:  Warm and dry Neck:  Midline trachea, normal thyroid, good ROM, no lymphadenopathy Lungs; Clear to auscultation bilaterally Breast:  No dominant palpable mass, retraction, or nipple discharge Cardiovascular: Regular rate and rhythm Abdomen:  Soft, non tender, no hepatosplenomegaly Pelvic:  External genitalia is normal in appearance, no lesions.  The vagina is normal in appearance. Urethra has no lesions or masses. The cervix is bulbous.pap with HR HPV genotyping performed. Uterus is felt to be normal size, shape, and contour.  No adnexal masses or tenderness noted.Bladder is non tender, no masses felt. Rectal: Good sphincter tone, no polyps, or hemorrhoids felt.  Hemoccult negative. Extremities/musculoskeletal:  No swelling or varicosities noted, no clubbing or cyanosis Psych:  No [name] changes, alert and cooperative,seems happy AA is 1 Fall risk is low    06/28/2023   11:33 AM  01/08/2022    9:28 AM 03/14/2020    9:14 AM  Depression screen PHQ 2/9  Decreased Interest 0 0 0  Down, Depressed, Hopeless 0 0 0  PHQ - 2 Score 0 0 0  Altered sleeping 3  1  Tired, decreased energy 1  1  Change in appetite 0  1  Feeling bad or failure about yourself  0  0  Trouble concentrating 1  0  Moving slowly or fidgety/restless 1  0  Suicidal thoughts 0  0  PHQ-9 Score 6  3       06/28/2023   11:33 AM 03/14/2020    9:14 AM  GAD 7 : Generalized Anxiety Score  Nervous, Anxious, on Edge 1 0  Control/stop worrying 1 1  Worry too much - different things 1 0  Trouble relaxing 1 0  Restless 1 0  Easily annoyed or irritable 1 0  Afraid - awful might happen 0 0  Total GAD 7 Score 6 1      Upstream - 06/28/23 1139       Pregnancy Intention Screening   Does the patient want to become pregnant in the next year? No    Does the patient's partner want to become pregnant in the next year? No    Would the patient like to discuss contraceptive options today? No      Contraception Wrap Up   Current Method Female Sterilization    End Method Female Sterilization    Contraception Counseling Provided No            Examination  chaperoned by Ariel [name] LP Ariel Scearce NP student in room.    Impression and Plan: 1. Encounter for gynecological examination with Papanicolaou smear of cervix Pap sent Pap in 3 years if normal Physical in 1 year Mammogram is 07/01/23  - Cytology - PAP( [facility])  2. Encounter for screening fecal occult blood testing Hemoccult was negative  - POCT occult blood stool  3. Peri-menopausal   4. Anxiety +anxiety  Will try lexarpo for now  Meds ordered this encounter  Medications   amLODipine (NORVASC) 5 MG tablet    Sig: Take 1 tablet (5 mg total) by mouth daily.    Dispense:  30 tablet    Refill:  4    Order Specific Question:   Supervising Provider    Answer:   Ariel [name] [[id number]]   escitalopram (LEXAPRO) 10 MG tablet    Sig: Take  1 tablet (10 mg total) by mouth daily.    Dispense:  30 tablet    Refill:  3    Order Specific Question:   Supervising Provider    Answer:   Ariel [name] [[id number]]    Will follow up in 8 weeks   5. Hypertension, unspecified type Stopped Microzide, will rx Norvasc 5 mg 1 daily Decrease salt and sugars  Will recheck in 8 weeks   6. Screening for colorectal cancer Referred to [facility] for colonoscopy  - Ambulatory referral to Gastroenterology  7. Irregular periods Skipping periods now, had one July and February before that

## 2023-06-30 ENCOUNTER — Encounter: Payer: Self-pay | Admitting: *Deleted

## 2023-07-01 ENCOUNTER — Ambulatory Visit (HOSPITAL_COMMUNITY): Admission: RE | Admit: 2023-07-01 | Payer: 59 | Source: Ambulatory Visit

## 2023-07-01 ENCOUNTER — Ambulatory Visit (HOSPITAL_COMMUNITY)
Admission: RE | Admit: 2023-07-01 | Discharge: 2023-07-01 | Disposition: A | Discharge status: Home / Self Care | Payer: 59 | Source: Ambulatory Visit | Attending: Family Medicine | Admitting: Family Medicine

## 2023-07-01 ENCOUNTER — Encounter (HOSPITAL_COMMUNITY): Payer: Self-pay

## 2023-07-01 DIAGNOSIS — Z1231 Encounter for screening mammogram for malignant neoplasm of breast: Secondary | ICD-10-CM

## 2023-07-02 LAB — CYTOLOGY - PAP
Comment: NEGATIVE
Diagnosis: NEGATIVE
High risk HPV: NEGATIVE

## 2023-07-12 ENCOUNTER — Ambulatory Visit: Payer: 59 | Admitting: Family Medicine

## 2023-08-23 ENCOUNTER — Encounter: Payer: Self-pay | Admitting: Adult Health

## 2023-08-23 ENCOUNTER — Ambulatory Visit: Payer: 59 | Admitting: Adult Health

## 2023-08-23 VITALS — BP 150/90 | HR 74 | Ht 65.5 in | Wt 215.2 lb

## 2023-08-23 DIAGNOSIS — I10 Essential (primary) hypertension: Secondary | ICD-10-CM | POA: Diagnosis not present

## 2023-08-23 DIAGNOSIS — F419 Anxiety disorder, unspecified: Secondary | ICD-10-CM

## 2023-08-23 DIAGNOSIS — N926 Irregular menstruation, unspecified: Secondary | ICD-10-CM | POA: Diagnosis not present

## 2023-08-23 DIAGNOSIS — N951 Menopausal and female climacteric states: Secondary | ICD-10-CM | POA: Diagnosis not present

## 2023-08-23 DIAGNOSIS — N898 Other specified noninflammatory disorders of vagina: Secondary | ICD-10-CM

## 2023-08-23 MED ORDER — ESCITALOPRAM OXALATE 10 MG PO TABS: 10.0000 mg | ORAL_TABLET | Freq: Every day | ORAL | 6 refills | Status: DC

## 2023-08-23 MED ORDER — AMLODIPINE BESYLATE 10 MG PO TABS: 10.0000 mg | ORAL_TABLET | Freq: Every day | ORAL | 6 refills | Status: DC

## 2023-08-23 MED ORDER — ESTRADIOL 0.1 MG/GM VA CREA
TOPICAL_CREAM | VAGINAL | 3 refills | Status: DC
Start: 2023-08-23 — End: 2023-11-16

## 2023-08-23 NOTE — Progress Notes (Signed)
Subjective:     Patient ID: Ariel [name], female   DOB: 17-Dec-1970, 52 y.o.   MRN: [id number]  HPI Ariel [name] is a 52 year old black female, single, G2P2 back in follow up on starting Norvasc 5 mg 1 daily  and taking lexapro. She has not taken Norvasc today, and is taking 1/2 lexapro, due to sleep disturbance.      Component Value Date/Time   DIAGPAP  06/28/2023 1233    - Negative for intraepithelial lesion or malignancy (NILM)   DIAGPAP  10/17/2018 0000    NEGATIVE FOR INTRAEPITHELIAL LESIONS OR MALIGNANCY.   HPVHIGH Negative 06/28/2023 1233   ADEQPAP  06/28/2023 1233    Satisfactory for evaluation; transformation zone component PRESENT.   ADEQPAP  10/17/2018 0000    Satisfactory for evaluation  endocervical/transformation zone component PRESENT.   Review of Systems  Periods irregular Anxiety is better Hot flashes better Not sleeping as well Has vaginal dryness Reviewed past medical,surgical, social and family history. Reviewed medications and allergies.     Objective:   Physical Exam BP (!) 150/90 (BP Location: Left Arm, Patient Position: Sitting, Cuff Size: Normal)   Pulse 74   Ht 5' 5.5" (1.664 m)   Wt 215 lb 3.2 oz (97.6 kg)   BMI 35.27 kg/m     Skin warm and dry.  Lungs: clear to ausculation bilaterally. Cardiovascular: regular rate and rhythm.   Upstream - 08/23/23 0907       Pregnancy Intention Screening   Does the patient want to become pregnant in the next year? No    Does the patient's partner want to become pregnant in the next year? No    Would the patient like to discuss contraceptive options today? No      Contraception Wrap Up   Current Method Female Sterilization    End Method Female Sterilization    Contraception Counseling Provided No             Assessment:     1. Peri-menopausal  2. Anxiety Anxiety is better Take 10 mg 1 daily but takin am, if still messes with sleep go back to 1/2 tablet   3. Irregular periods Periods irregular last  period in July  4. Vaginal dryness +vaginal dryness Will rx estrace vaginal cream  Meds ordered this encounter  Medications   escitalopram (LEXAPRO) 10 MG tablet    Sig: Take 1 tablet (10 mg total) by mouth daily.    Dispense:  30 tablet    Refill:  6    Order Specific Question:   Supervising Provider    Answer:   Duane Lope H [[id number]]   amLODipine (NORVASC) 10 MG tablet    Sig: Take 1 tablet (10 mg total) by mouth daily.    Dispense:  30 tablet    Refill:  6    Order Specific Question:   Supervising Provider    Answer:   Duane Lope H [[id number]]   estradiol (ESTRACE VAGINAL) 0.1 MG/GM vaginal cream    Sig: Use 0.5 gm in vaginal nightly for 2 weeks then 2 x weekly    Dispense:  42.5 g    Refill:  3    Order Specific Question:   Supervising Provider    Answer:   EURE, LUTHER H [[id number]]     5. Hypertension, unspecified type Will increase Norvasc to 10 mg 1 daily    Continue to watch salt and sugars   Plan:     Follow up in 3  months for BP check and ROS

## 2023-10-07 ENCOUNTER — Ambulatory Visit: Payer: 59 | Admitting: Family Medicine

## 2023-11-16 ENCOUNTER — Telehealth: Payer: Self-pay | Admitting: *Deleted

## 2023-11-16 NOTE — Telephone Encounter (Signed)
  Procedure: COLONOSCOPY  Height: 5'5.5 Weight: 216LBS       Have you had a colonoscopy before?  no  Do you have family history of colon cancer?  no  Do you have a family history of polyps? no  Previous colonoscopy with polyps removed? no  Do you have a history colorectal cancer?   no  Are you diabetic?  Yes type 2  Do you have a prosthetic or mechanical heart valve? no  Do you have a pacemaker/defibrillator?   no  Have you had endocarditis/atrial fibrillation?  no  Do you use supplemental oxygen/CPAP?  no  Have you had joint replacement within the last 12 months?  no  Do you tend to be constipated or have to use laxatives?  no   Do you have history of alcohol use? If yes, how much and how often.  no  Do you have history or are you using drugs? If yes, what do are you  using?  no  Have you ever had a stroke/heart attack?  no  Have you ever had a heart or other vascular stent placed,?no  Do you take weight loss medication? yes  female patients,: have you had a hysterectomy? no                              are you post menopausal?  no                              do you still have your menstrual cycle? no    Date of last menstrual period? 03/2023  Do you take any blood-thinning medications such as: (Plavix, aspirin, Coumadin, Aggrenox, Brilinta, Xarelto, Eliquis, Pradaxa, Savaysa or Effient)? no  If yes we need the name, milligram, dosage and who is prescribing doctor:               Current Outpatient Medications  Medication Sig Dispense Refill   amLODipine  (NORVASC ) 10 MG tablet Take 1 tablet (10 mg total) by mouth daily. 30 tablet 6   escitalopram  (LEXAPRO ) 10 MG tablet Take 1 tablet (10 mg total) by mouth daily. 30 tablet 6   Semaglutide ,0.25 or 0.5MG /DOS, (OZEMPIC , 0.25 OR 0.5 MG/DOSE,) 2 MG/1.5ML SOPN Inject into the skin.     No current facility-administered medications for this visit.    No Known Allergies

## 2023-11-18 NOTE — Telephone Encounter (Signed)
 ASA 2. May still need preg test, per protocol Hold semaglutide  for one week.

## 2023-11-22 NOTE — Telephone Encounter (Signed)
 LMTCB for pt

## 2023-11-23 ENCOUNTER — Ambulatory Visit: Payer: 59 | Admitting: Adult Health

## 2023-11-23 ENCOUNTER — Other Ambulatory Visit (HOSPITAL_COMMUNITY)
Admission: RE | Admit: 2023-11-23 | Discharge: 2023-11-23 | Disposition: A | Discharge status: Home / Self Care | Payer: 59 | Source: Ambulatory Visit | Attending: Adult Health | Admitting: Adult Health

## 2023-11-23 ENCOUNTER — Encounter: Payer: Self-pay | Admitting: Adult Health

## 2023-11-23 VITALS — BP 164/87 | HR 80 | Ht 65.5 in | Wt 224.0 lb

## 2023-11-23 DIAGNOSIS — I10 Essential (primary) hypertension: Secondary | ICD-10-CM | POA: Diagnosis not present

## 2023-11-23 DIAGNOSIS — Z1331 Encounter for screening for depression: Secondary | ICD-10-CM

## 2023-11-23 DIAGNOSIS — N898 Other specified noninflammatory disorders of vagina: Secondary | ICD-10-CM | POA: Diagnosis present

## 2023-11-23 DIAGNOSIS — F419 Anxiety disorder, unspecified: Secondary | ICD-10-CM | POA: Diagnosis not present

## 2023-11-23 MED ORDER — PEG 3350-KCL-NA BICARB-NACL 420 G PO SOLR: 4000.0000 mL | Freq: Once | ORAL | 0 refills | Status: AC

## 2023-11-23 NOTE — Addendum Note (Signed)
Addended by: Armstead Peaks on: 11/23/2023 08:38 AM   Modules accepted: Orders

## 2023-11-23 NOTE — Telephone Encounter (Addendum)
Spoke with pt. She has been scheduled for 3/10. Aware will send instructions to her. Rx for prep sent to pharmacy. Reports she no longer is on ozempic. She also reports she has not had period since 03/2023.

## 2023-11-23 NOTE — Progress Notes (Signed)
Subjective:     Patient ID: Ariel [name], female   DOB: 18-Feb-1971, 53 y.o.   MRN: [id number]  HPI Ariel [name] is a 53 year old black female,single, G2P2 in for BP check, was increased to Norvasc 10 mg 08/23/23 and on taking lexapro. She says BP at work at been good, 120/80, and doing well now with lexapro 10 mg, does complaining of vaginal discharge, no odor, ?burning.     Component Value Date/Time   DIAGPAP  06/28/2023 1233    - Negative for intraepithelial lesion or malignancy (NILM)   DIAGPAP  10/17/2018 0000    NEGATIVE FOR INTRAEPITHELIAL LESIONS OR MALIGNANCY.   HPVHIGH Negative 06/28/2023 1233   ADEQPAP  06/28/2023 1233    Satisfactory for evaluation; transformation zone component PRESENT.   ADEQPAP  10/17/2018 0000    Satisfactory for evaluation  endocervical/transformation zone component PRESENT.    PCP is Hetty Blend NP  Review of Systems Doing good on lexapro 10 mg, not as anxious +vaginal discharge, no odor, ?burning She is not sexually active she says Reviewed past medical,surgical, social and family history. Reviewed medications and allergies.     Objective:   Physical Exam BP (!) 164/87 (BP Location: Right Arm, Patient Position: Sitting, Cuff Size: Normal)   Pulse 80   Ht 5' 5.5" (1.664 m)   Wt 224 lb (101.6 kg)   LMP 11/02/2023 (Approximate)   BMI 36.71 kg/m     Skin warm and dry.  Lungs: clear to ausculation bilaterally. Cardiovascular: regular rate and rhythm.  Pelvic: external genitalia is normal in appearance no lesions, vagina: white discharge without odor,urethra has no lesions or masses noted, cervix:smooth and bulbous, uterus: normal size, shape and contour, non tender, no masses felt, adnexa: no masses or tenderness noted. Bladder is non tender and no masses felt. CV swab obtained. Fall risk is low    11/23/2023    9:01 AM 08/23/2023    8:51 AM 06/28/2023   11:33 AM  Depression screen PHQ 2/9  Decreased Interest  0 0  Down, Depressed, Hopeless 0 0 0   PHQ - 2 Score 0 0 0  Altered sleeping 1 1 3   Tired, decreased energy 1 0 1  Change in appetite 1 1 0  Feeling bad or failure about yourself  0 0 0  Trouble concentrating 0 0 1  Moving slowly or fidgety/restless 0 0 1  Suicidal thoughts 0 0 0  PHQ-9 Score 3 2 6        11/23/2023    9:03 AM 08/23/2023    8:53 AM 06/28/2023   11:33 AM 03/14/2020    9:14 AM  GAD 7 : Generalized Anxiety Score  Nervous, Anxious, on Edge 0 1 1 0  Control/stop worrying 1 0 1 1  Worry too much - different things 1 0 1 0  Trouble relaxing 1 0 1 0  Restless 0 0 1 0  Easily annoyed or irritable 0 0 1 0  Afraid - awful might happen 0 0 0 0  Total GAD 7 Score 3 1 6 1     Upstream - 11/23/23 0912       Pregnancy Intention Screening   Does the patient want to become pregnant in the next year? No    Does the patient's partner want to become pregnant in the next year? No    Would the patient like to discuss contraceptive options today? No      Contraception Wrap Up   Current Method Female Sterilization  End Method Female Sterilization    Contraception Counseling Provided No            Examination chaperoned by Malachy Mood LPN    Assessment:     1. Vaginal discharge CV swab sent for BV, and yeast, she declines STD testing, is not sexually active - Cervicovaginal ancillary only( [facility])  2. Anxiety She says not as anxious  Will continue lexapro 10 mg 1 daily has refills   3. Hypertension, unspecified type (Primary) Continue norvasc 10 mg 1 daily, has refills Keep check on BP at work Return in 3 months for BP and ROS     Plan:     Follow up in 3 months for BP check and ROS or sooner if needed

## 2023-11-24 LAB — CERVICOVAGINAL ANCILLARY ONLY
Bacterial Vaginitis (gardnerella): NEGATIVE
Candida Glabrata: NEGATIVE
Candida Vaginitis: NEGATIVE
Comment: NEGATIVE
Comment: NEGATIVE
Comment: NEGATIVE

## 2023-11-25 ENCOUNTER — Encounter (INDEPENDENT_AMBULATORY_CARE_PROVIDER_SITE_OTHER): Payer: Self-pay | Admitting: *Deleted

## 2023-11-25 NOTE — Telephone Encounter (Signed)
Referral completed, [facility] apt letter sent to PCP

## 2023-12-20 ENCOUNTER — Ambulatory Visit (HOSPITAL_COMMUNITY)
Admission: RE | Admit: 2023-12-20 | Discharge: 2023-12-20 | Disposition: A | Discharge status: Home / Self Care | Payer: 59 | Attending: Internal Medicine | Admitting: Internal Medicine

## 2023-12-20 ENCOUNTER — Ambulatory Visit (HOSPITAL_COMMUNITY): Admitting: Anesthesiology

## 2023-12-20 ENCOUNTER — Other Ambulatory Visit: Payer: Self-pay

## 2023-12-20 ENCOUNTER — Encounter (HOSPITAL_COMMUNITY)
Admission: RE | Disposition: A | Discharge status: Home / Self Care | Payer: Self-pay | Source: Home / Self Care | Attending: Internal Medicine

## 2023-12-20 ENCOUNTER — Encounter (HOSPITAL_COMMUNITY): Payer: Self-pay | Admitting: Internal Medicine

## 2023-12-20 DIAGNOSIS — I10 Essential (primary) hypertension: Secondary | ICD-10-CM | POA: Diagnosis not present

## 2023-12-20 DIAGNOSIS — Z1211 Encounter for screening for malignant neoplasm of colon: Secondary | ICD-10-CM

## 2023-12-20 DIAGNOSIS — K640 First degree hemorrhoids: Secondary | ICD-10-CM

## 2023-12-20 HISTORY — PX: COLONOSCOPY WITH PROPOFOL: SHX5780

## 2023-12-20 SURGERY — COLONOSCOPY WITH PROPOFOL
Anesthesia: General

## 2023-12-20 MED ORDER — DEXMEDETOMIDINE HCL IN NACL 80 MCG/20ML IV SOLN
INTRAVENOUS | Status: DC | PRN
  Administered 2023-12-20: 4 ug via INTRAVENOUS

## 2023-12-20 MED ORDER — PROPOFOL 500 MG/50ML IV EMUL
INTRAVENOUS | Status: DC | PRN
  Administered 2023-12-20: 150 ug/kg/min via INTRAVENOUS

## 2023-12-20 MED ORDER — PROPOFOL 10 MG/ML IV BOLUS
INTRAVENOUS | Status: DC | PRN
  Administered 2023-12-20: 80 mg via INTRAVENOUS
  Administered 2023-12-20: 30 mg via INTRAVENOUS

## 2023-12-20 MED ORDER — LIDOCAINE HCL (CARDIAC) PF 100 MG/5ML IV SOSY
PREFILLED_SYRINGE | INTRAVENOUS | Status: DC | PRN
Start: 2023-12-20 — End: 2023-12-20
  Administered 2023-12-20: 60 mg via INTRATRACHEAL

## 2023-12-20 MED ORDER — LACTATED RINGERS IV SOLN: INTRAVENOUS | Status: DC

## 2023-12-20 NOTE — Transfer of Care (Addendum)
 Immediate Anesthesia Transfer of Care Note  Patient: Ariel [name]  Procedure(s) Performed: COLONOSCOPY WITH PROPOFOL  Patient Location: Endoscopy Unit  Anesthesia Type:General  Level of Consciousness: drowsy and patient cooperative  Airway & Oxygen Therapy: Patient Spontanous Breathing and Patient connected to nasal cannula oxygen  Post-op Assessment: Report given to RN and Post -op Vital signs reviewed and stable  Post vital signs: Reviewed and stable  Last Vitals:  Vitals Value Taken Time  BP 92/49 12/20/23   1138  Temp 36.8 12/20/23   1138  Pulse 80 12/20/23   1138  Resp 22 12/20/23   1138  SpO2 95% 12/20/23   1138    Last Pain:  Vitals:   12/20/23 1120  TempSrc:   PainSc: 0-No pain      Patients Stated Pain Goal: 6 (12/20/23 1047)  Complications: No notable events documented.

## 2023-12-20 NOTE — H&P (Signed)
 @LOGO @   Primary Care Physician:  Avanell Shackleton, NP-C Primary Gastroenterologist:  Dr. Jena Gauss  Pre-Procedure History & Physical: HPI:  Ariel [name] is a 53 y.o. female is here for a screening colonoscopy.  no bowel symptoms.  No prior colonoscopy.  Past Medical History:  Diagnosis Date   Boil of buttock 09/12/2015   comes and goes- not at present   BV (bacterial vaginosis) 08/09/2013   03-05-16 no problems now   Hypertension    Obesity    Panic attack 07/13/2015   wakes up from sleep with panic attack   Transfusion history    25 yrs ago after childbirth -NVD   Vaginal discharge 04/15/2016   Vaginal itching 08/09/2013   no problem now   Yeast infection 04/15/2016    Past Surgical History:  Procedure Laterality Date   LAPAROSCOPIC GASTRIC SLEEVE RESECTION N/A 03/17/2016   Procedure: LAPAROSCOPIC GASTRIC SLEEVE RESECTION WITH UPPER ENDOSCOPY;  Surgeon: Ovidio Kin, MD;  Location: [facility];  Service: General;  Laterality: N/A;   TUBAL LIGATION     UPPER GI ENDOSCOPY  03/17/2016   Procedure: UPPER GI ENDOSCOPY;  Surgeon: Ovidio Kin, MD;  Location: [facility];  Service: General;;   WISDOM TOOTH EXTRACTION      Prior to Admission medications   Medication Sig Start Date End Date Taking? Authorizing Provider  amLODipine (NORVASC) 10 MG tablet Take 1 tablet (10 mg total) by mouth daily. 08/23/23  Yes Cyril Mourning A, NP  escitalopram (LEXAPRO) 10 MG tablet Take 1 tablet (10 mg total) by mouth daily. 08/23/23 08/22/24 Yes Adline Potter, NP    Allergies as of 11/23/2023   (No Known Allergies)    Family History  Problem Relation Age of Onset   Hypertension Mother    Diabetes Father    Hypertension Father     Social History   Socioeconomic History   Marital status: Single    Spouse name: Not on file   Number of children: 2   Years of education: Not on file   Highest education level: Not on file  Occupational History   Not on file  Tobacco Use   Smoking  status: Never   Smokeless tobacco: Never  Vaping Use   Vaping status: Never Used  Substance and Sexual Activity   Alcohol use: No    Comment: rare -occ   Drug use: Never   Sexual activity: Not Currently    Birth control/protection: Surgical    Comment: tubal  Other Topics Concern   Not on file  Social History Narrative   Not on file   Social Drivers of Health   Financial Resource Strain: Low Risk  (06/28/2023)   Overall Financial Resource Strain (CARDIA)    Difficulty of Paying Living Expenses: Not very hard  Food Insecurity: No Food Insecurity (06/28/2023)   Hunger Vital Sign    Worried About Running Out of Food in the Last Year: Never true    Ran Out of Food in the Last Year: Never true  Transportation Needs: No Transportation Needs (06/28/2023)   PRAPARE - Administrator, Civil Service (Medical): No    Lack of Transportation (Non-Medical): No  Physical Activity: Insufficiently Active (06/28/2023)   Exercise Vital Sign    Days of Exercise per Week: 2 days    Minutes of Exercise per Session: 30 min  Stress: Stress Concern Present (06/28/2023)   [employer] of Occupational Health - Occupational Stress Questionnaire    Feeling of Stress :  To some extent  Social Connections: Moderately Isolated (06/28/2023)   Social Connection and Isolation Panel [NHANES]    Frequency of Communication with Friends and Family: Three times a week    Frequency of Social Gatherings with Friends and Family: Once a week    Attends Religious Services: More than 4 times per year    Active Member of [employer] or Organizations: No    Attends Banker Meetings: Never    Marital Status: Never married  Intimate Partner Violence: Not At Risk (06/28/2023)   Humiliation, Afraid, Rape, and Kick questionnaire    Fear of Current or Ex-Partner: No    Emotionally Abused: No    Physically Abused: No    Sexually Abused: No    Review of Systems: See HPI, otherwise negative ROS  Physical  Exam: BP (!) 159/74   Pulse 90   Temp 97.7 F (36.5 C) (Oral)   Resp 18   Ht 5' 5.5" (1.664 m)   Wt 98.4 kg   LMP 11/02/2023 (Approximate)   SpO2 99%   BMI 35.56 kg/m  General:   Alert,  Well-developed, well-nourished, pleasant and cooperative in NAD Head:  Normocephalic and atraumatic. Neck:  Supple; no masses or thyromegaly. Lungs:  Clear throughout to auscultation.   No wheezes, crackles, or rhonchi. No acute distress. Heart:  Regular rate and rhythm; no murmurs, clicks, rubs,  or gallops. Abdomen:  Soft, nontender and nondistended. No masses, hepatosplenomegaly or hernias noted. Normal bowel sounds, without guarding, and without rebound.    Impression/Plan: Ariel [name] is now here to undergo a screening colonoscopy.  First ever average risk screening examination  Risks, benefits, limitations, imponderables and alternatives regarding colonoscopy have been reviewed with the patient. Questions have been answered. All parties agreeable.     Notice:  This dictation was prepared with Dragon dictation along with smaller phrase technology. Any transcriptional errors that result from this process are unintentional and may not be corrected upon review.

## 2023-12-20 NOTE — Discharge Instructions (Addendum)
  Colonoscopy Discharge Instructions  Read the instructions outlined below and refer to this sheet in the next few weeks. These discharge instructions provide you with general information on caring for yourself after you leave the hospital. Your doctor may also give you specific instructions. While your treatment has been planned according to the most current medical practices available, unavoidable complications occasionally occur. If you have any problems or questions after discharge, call Dr. Jena Gauss at [phone]. ACTIVITY You may resume your regular activity, but move at a slower pace for the next 24 hours.  Take frequent rest periods for the next 24 hours.  Walking will help get rid of the air and reduce the bloated feeling in your belly (abdomen).  No driving for 24 hours (because of the medicine (anesthesia) used during the test).   Do not sign any important legal documents or operate any machinery for 24 hours (because of the anesthesia used during the test).  NUTRITION Drink plenty of fluids.  You may resume your normal diet as instructed by your doctor.  Begin with a light meal and progress to your normal diet. Heavy or fried foods are harder to digest and may make you feel sick to your stomach (nauseated).  Avoid alcoholic beverages for 24 hours or as instructed.  MEDICATIONS You may resume your normal medications unless your doctor tells you otherwise.  WHAT YOU CAN EXPECT TODAY Some feelings of bloating in the abdomen.  Passage of more gas than usual.  Spotting of blood in your stool or on the toilet paper.  IF YOU HAD POLYPS REMOVED DURING THE COLONOSCOPY: No aspirin products for 7 days or as instructed.  No alcohol for 7 days or as instructed.  Eat a soft diet for the next 24 hours.  FINDING OUT THE RESULTS OF YOUR TEST Not all test results are available during your visit. If your test results are not back during the visit, make an appointment with your caregiver to find out the  results. Do not assume everything is normal if you have not heard from your caregiver or the medical facility. It is important for you to follow up on all of your test results.  SEEK IMMEDIATE MEDICAL ATTENTION IF: You have more than a spotting of blood in your stool.  Your belly is swollen (abdominal distention).  You are nauseated or vomiting.  You have a temperature over 101.  You have abdominal pain or discomfort that is severe or gets worse throughout the day.       no polyps found today  It is recommended you return in 10 years for repeat colonoscopy.

## 2023-12-20 NOTE — Anesthesia Preprocedure Evaluation (Addendum)
Anesthesia Evaluation  Patient identified by MRN, date of birth, ID band Patient awake    Reviewed: Allergy & Precautions, H&P , NPO status , Patient's Chart, lab work & pertinent test results, reviewed documented beta blocker date and time   Airway Mallampati: II  TM Distance: >3 FB Neck ROM: full    Dental no notable dental hx.    Pulmonary neg pulmonary ROS   Pulmonary exam normal breath sounds clear to auscultation       Cardiovascular Exercise Tolerance: Good hypertension, negative cardio ROS  Rhythm:regular Rate:Normal     Neuro/Psych   Anxiety     negative neurological ROS  negative psych ROS   GI/Hepatic negative GI ROS, Neg liver ROS,GERD  ,,  Endo/Other  negative endocrine ROSdiabetes    Renal/GU negative Renal ROS  negative genitourinary   Musculoskeletal   Abdominal   Peds  Hematology negative hematology ROS (+) Blood dyscrasia, anemia   Anesthesia Other Findings   Reproductive/Obstetrics negative OB ROS                             Anesthesia Physical Anesthesia Plan  ASA: 2  Anesthesia Plan: General   Post-op Pain Management:    Induction:   PONV Risk Score and Plan: Propofol infusion  Airway Management Planned:   Additional Equipment:   Intra-op Plan:   Post-operative Plan:   Informed Consent: I have reviewed the patients History and Physical, chart, labs and discussed the procedure including the risks, benefits and alternatives for the proposed anesthesia with the patient or authorized representative who has indicated his/her understanding and acceptance.     Dental Advisory Given  Plan Discussed with: CRNA  Anesthesia Plan Comments:        Anesthesia Quick Evaluation

## 2023-12-20 NOTE — Op Note (Signed)
 [facility] Patient Name: Ariel [name] Procedure Date: 12/20/2023 11:11 AM MRN: [id number] Date of Birth: 09-Aug-1971 Attending MD: Gennette Pac , MD, [id number] CSN: [id number] Age: 53 Admit Type: Outpatient Procedure:                Colonoscopy Indications:              Screening for colorectal malignant neoplasm Providers:                Gennette Pac, MD, Angelica Ran, Elinor Parkinson Referring MD:              Medicines:                Propofol per Anesthesia Complications:            No immediate complications. Estimated Blood Loss:     Estimated blood loss: none. Procedure:                Pre-Anesthesia Assessment:                           - Prior to the procedure, a History and Physical                            was performed, and patient medications and                            allergies were reviewed. The patient's tolerance of                            previous anesthesia was also reviewed. The risks                            and benefits of the procedure and the sedation                            options and risks were discussed with the patient.                            All questions were answered, and informed consent                            was obtained. Prior Anticoagulants: The patient has                            taken no anticoagulant or antiplatelet agents. ASA                            Grade Assessment: II - A patient with mild systemic                            disease. After reviewing the risks and benefits,  the patient was deemed in satisfactory condition to                            undergo the procedure.                           After obtaining informed consent, the colonoscope                            was passed under direct vision. Throughout the                            procedure, the patient's blood pressure, pulse, and                            oxygen  saturations were monitored continuously. The                            [id number]) scope was introduced through the                            anus and advanced to the the cecum, identified by                            appendiceal orifice and ileocecal valve. The                            colonoscopy was performed without difficulty. The                            patient tolerated the procedure well. The quality                            of the bowel preparation was adequate. The                            colonoscopy was performed without difficulty. The                            entire colon was well visualized. Scope In: 11:24:58 AM Scope Out: 11:34:21 AM Scope Withdrawal Time: 0 hours 7 minutes 10 seconds  Total Procedure Duration: 0 hours 9 minutes 23 seconds  Findings:      The perianal and digital rectal examinations were normal.      Non-bleeding internal hemorrhoids were found during retroflexion. The       hemorrhoids were mild, small and Grade I (internal hemorrhoids that do       not prolapse).      The entire examined colon appeared normal.      The retroflexed view of the distal rectum and anal verge was normal and       showed no anal or rectal abnormalities. Impression:               - Non-bleeding internal hemorrhoids.                           -  The entire examined colon is normal.                           - The distal rectum and anal verge are normal on                            retroflexion view.                           - No specimens collected. Moderate Sedation:      Moderate (conscious) sedation was personally administered by an       anesthesia professional. The following parameters were monitored: oxygen       saturation, heart rate, blood pressure, respiratory rate, EKG, adequacy       of pulmonary ventilation, and response to care. Recommendation:           - Patient has a contact number available for                            emergencies.  The signs and symptoms of potential                            delayed complications were discussed with the                            patient. Return to normal activities tomorrow.                            Written discharge instructions were provided to the                            patient.                           - Advance diet as tolerated.                           - Continue present medications.                           - Repeat colonoscopy in 10 years for screening                            purposes.                           - Return to GI office PRN. Procedure Code(s):        --- Professional ---                           [phone], Colonoscopy, flexible; diagnostic, including                            collection of specimen(s) by brushing or washing,                            when  performed (separate procedure) Diagnosis Code(s):        --- Professional ---                           Z12.11, Encounter for screening for malignant                            neoplasm of colon                           K64.0, First degree hemorrhoids CPT copyright 2022 [employer]. All rights reserved. The codes documented in this report are preliminary and upon coder review may  be revised to meet current compliance requirements. Gerrit Friends. Sakiya Stepka, MD Gennette Pac, MD 12/20/2023 11:38:05 AM This report has been signed electronically. Number of Addenda: 0

## 2023-12-20 NOTE — Anesthesia Postprocedure Evaluation (Signed)
 Anesthesia Post Note  Patient: Ariel [name]  Procedure(s) Performed: COLONOSCOPY WITH PROPOFOL  Patient location during evaluation: Phase II Anesthesia Type: General Level of consciousness: awake Pain management: pain level controlled Vital Signs Assessment: post-procedure vital signs reviewed and stable Respiratory status: spontaneous breathing and respiratory function stable Cardiovascular status: blood pressure returned to baseline and stable Postop Assessment: no headache and no apparent nausea or vomiting Anesthetic complications: no Comments: Late entry   No notable events documented.   Last Vitals:  Vitals:   12/20/23 1138 12/20/23 1142  BP: (!) 92/49 113/65  Pulse: 78 84  Resp: (!) 22 (!) 22  Temp: 36.8 C   SpO2: 95% 96%    Last Pain:  Vitals:   12/20/23 1142  TempSrc:   PainSc: 0-No pain                 Windell Norfolk

## 2023-12-21 ENCOUNTER — Encounter (HOSPITAL_COMMUNITY): Payer: Self-pay | Admitting: Internal Medicine

## 2024-01-26 ENCOUNTER — Ambulatory Visit (INDEPENDENT_AMBULATORY_CARE_PROVIDER_SITE_OTHER): Admitting: Physician Assistant

## 2024-01-26 ENCOUNTER — Encounter (INDEPENDENT_AMBULATORY_CARE_PROVIDER_SITE_OTHER): Payer: Self-pay | Admitting: Physician Assistant

## 2024-01-26 VITALS — BP 154/90 | HR 68 | Temp 97.8°F | Ht 64.5 in | Wt 223.0 lb

## 2024-01-26 DIAGNOSIS — D508 Other iron deficiency anemias: Secondary | ICD-10-CM

## 2024-01-26 DIAGNOSIS — E785 Hyperlipidemia, unspecified: Secondary | ICD-10-CM | POA: Diagnosis not present

## 2024-01-26 DIAGNOSIS — N951 Menopausal and female climacteric states: Secondary | ICD-10-CM | POA: Diagnosis not present

## 2024-01-26 DIAGNOSIS — F419 Anxiety disorder, unspecified: Secondary | ICD-10-CM

## 2024-01-26 DIAGNOSIS — E1159 Type 2 diabetes mellitus with other circulatory complications: Secondary | ICD-10-CM

## 2024-01-26 DIAGNOSIS — K219 Gastro-esophageal reflux disease without esophagitis: Secondary | ICD-10-CM

## 2024-01-26 DIAGNOSIS — E119 Type 2 diabetes mellitus without complications: Secondary | ICD-10-CM

## 2024-01-26 DIAGNOSIS — I152 Hypertension secondary to endocrine disorders: Secondary | ICD-10-CM

## 2024-01-26 DIAGNOSIS — E1169 Type 2 diabetes mellitus with other specified complication: Secondary | ICD-10-CM | POA: Diagnosis not present

## 2024-01-26 DIAGNOSIS — Z6836 Body mass index (BMI) 36.0-36.9, adult: Secondary | ICD-10-CM

## 2024-01-26 DIAGNOSIS — E66812 Obesity, class 2: Secondary | ICD-10-CM

## 2024-01-26 DIAGNOSIS — Z0289 Encounter for other administrative examinations: Secondary | ICD-10-CM

## 2024-01-26 DIAGNOSIS — Z903 Acquired absence of stomach [part of]: Secondary | ICD-10-CM

## 2024-01-26 NOTE — Progress Notes (Signed)
 Office: [phone]  /  Fax: [phone]   Initial Visit  Ariel [name] was seen in clinic today to evaluate for obesity. She is interested in losing weight to improve overall health and reduce the risk of weight related complications. She presents today to review program treatment options, initial physical assessment, and evaluation.     Discussed the use of AI scribe software for clinical note transcription with the patient, who gave verbal consent to proceed.  History of Present Illness Ariel [name] is a 53 year old female who is seen for evaluation for obesity treatment.  She previously underwent a gastric sleeve by Dr. Ovidio Kin and 2017.  Her weight preoperatively was 259 pounds.  Her nadir weight following surgery was 199 pounds.  She does have a history of type 2 diabetes and had been started on Ozempic in 2023 due to weight regain as well as for treatment of her type 2 diabetes with an A1c greater than 7.  She experienced about 30 pound weight loss on Ozempic and her A1c was in goal range of less than 6, however she did not continue on Ozempic following a change to a different PCP.  Since this time she has been undergoing menopause and notes weight regain again.  And feels her weight has been increasing over the past year due to menopause.  She is not currently following a nutrition plan.  She works full-time in a [employer] and does have high NEAT at work, but is not exercising regularly.     She was referred by: Friend or Family  When asked what else they would like to accomplish? She states: Adopt healthier eating patterns, Improve energy levels and physical activity, Improve existing medical conditions, Reduce number of medications, Improve quality of life, Improve appearance, Improve self-confidence, and Lose a target amount of weight : <200 lbs  lbs in 6-9 months.  When asked how has your weight affected you? She states: Has affected self-esteem,  Contributed to medical problems, Contributed to orthopedic problems or mobility issues, Having fatigue, Having poor endurance, and Problems with eating patterns  Weight history: Previous peak weight of 259 pounds prior to gastric sleeve surgery.  Nadir weight following surgery 199 pounds.  Some associated conditions: Hypertension, Arthritis: Knees, Hyperlipidemia, Diabetes, GERD, and Venous insufficiency  Contributing factors: Family history of obesity, Consumption of processed foods, Moderate to high levels of stress, Reduced physical activity, Eating patterns, Menopause, Strong orexigenic signaling and inadequate inhibitory control , Slow metabolism for age, and Enticing relationships and enviroment  Weight promoting medications identified: None  Current nutrition plan: None  Current level of physical activity: Limited due to chronic pain or orthopedic problems and Walking 20 minutes, twice a week  Current or previous pharmacotherapy: GLP-1, Metformin, and Phentermine  Response to medication: Lost weight initially but was unable to sustain weight loss On Ozempic in 2023 and lost weight but PCP changed and was unable to get Ozempic consistently filled. Lost 30 lbs.    Past medical history includes:   Past Medical History:  Diagnosis Date   Boil of buttock 09/12/2015   comes and goes- not at present   BV (bacterial vaginosis) 08/09/2013   03-05-16 no problems now   Hypertension    Obesity    Panic attack 07/13/2015   wakes up from sleep with panic attack   Transfusion history    25 yrs ago after childbirth -NVD   Vaginal discharge 04/15/2016   Vaginal itching 08/09/2013   no problem now  Yeast infection 04/15/2016     Objective:   BP (!) 154/90   Pulse 68   Temp 97.8 F (36.6 C)   Ht 5' 4.5" (1.638 m)   Wt 223 lb (101.2 kg)   SpO2 98%   BMI 37.69 kg/m  She was weighed on the bioimpedance scale: Body mass index is 37.69 kg/m.  Peak Weight: 259 pounds, Body Fat%:  44.5%, Visceral Fat Rating: 12, Weight trend over the last 12 months: Increasing  General:  Alert, oriented and cooperative. Patient is in no acute distress.  Respiratory: Normal respiratory effort, no problems with respiration noted   Gait: able to ambulate independently  Mental Status: Normal mood and affect. Normal behavior. Normal judgment and thought content.   DIAGNOSTIC DATA REVIEWED:  BMET    Component Value Date/Time   NA 137 05/13/2022 0841   K 3.3 (L) 05/13/2022 0841   CL 101 05/13/2022 0841   CO2 28 05/13/2022 0841   GLUCOSE 86 05/13/2022 0841   BUN 10 05/13/2022 0841   CREATININE 0.79 05/13/2022 0841   CREATININE 0.80 04/24/2021 0831   CALCIUM 9.1 05/13/2022 0841   GFRNONAA 94 02/13/2021 1050   GFRAA 109 02/13/2021 1050   Lab Results  Component Value Date   HGBA1C 5.8 05/13/2022   HGBA1C 6.1 (H) 05/27/2012   No results found for: "INSULIN" CBC    Component Value Date/Time   WBC 4.9 01/08/2022 1001   RBC 4.63 01/08/2022 1001   HGB 12.6 01/08/2022 1001   HCT 37.6 01/08/2022 1001   PLT 287.0 01/08/2022 1001   MCV 81.2 01/08/2022 1001   MCH 26.5 (L) 04/24/2021 0831   MCHC 33.5 01/08/2022 1001   RDW 13.6 01/08/2022 1001   Iron/TIBC/Ferritin/ %Sat    Component Value Date/Time   IRON 45 11/01/2020 1203   TIBC 336 11/01/2020 1203   FERRITIN 30 11/01/2020 1203   IRONPCTSAT 13 (L) 11/01/2020 1203   Lipid Panel     Component Value Date/Time   CHOL 139 05/13/2022 0841   TRIG 92.0 05/13/2022 0841   HDL 39.10 05/13/2022 0841   CHOLHDL 4 05/13/2022 0841   VLDL 18.4 05/13/2022 0841   LDLCALC 81 05/13/2022 0841   LDLCALC 134 (H) 04/24/2021 0831   Hepatic Function Panel     Component Value Date/Time   PROT 7.6 01/08/2022 1001   ALBUMIN 4.2 01/08/2022 1001   AST 21 01/08/2022 1001   ALT 16 01/08/2022 1001   ALKPHOS 62 01/08/2022 1001   BILITOT 0.6 01/08/2022 1001      Component Value Date/Time   TSH 1.07 05/13/2022 0841     Assessment and Plan:    S/P gastric sleeve procedure- 2017- Dr Ezzard Standing [facility]  Menopausal symptoms  Type 2 diabetes mellitus without complication, without long-term current use of insulin (HCC)  Hyperlipidemia associated with type 2 diabetes mellitus (HCC)  Hypertension associated with diabetes (HCC)  Gastroesophageal reflux disease without esophagitis  Anxiety  Other iron deficiency anemia  Class 2 severe obesity due to excess calories with serious comorbidity and body mass index (BMI) of 36.0 to 36.9 in adult (HCC)  Current BMI 36.6  Assessment and Plan Assessment & Plan Emonie Espericueta is a very pleasant 53 year old female who is seen for evaluation of obesity treatment.  She has a past medical history significant for hypertension, hyperlipidemia, type 2 diabetes, iron deficiency anemia, gastroesophageal reflux, and menopausal symptoms who has experienced weight regain following gastric sleeve surgery in 2017.  She initially did well after surgery but  experienced weight regain and was started on Ozempic for type II diabetic management as her A1c was significantly elevated at 7.6.  She has not been on Ozempic in more recent years due to changing her primary care but has again experienced weight regain.  She has not had a recent follow-up A1c or other workup. The patient would benefit from a comprehensive obesity treatment program to address her obesity management as well as to address her multiple comorbidities.  She may benefit from ongoing treatment for her type 2 diabetes including possible non-insulin injectables to address multiple risk factors and complications which may be associated with type 2 diabetes.  The patient does wish to proceed with starting an obesity treatment program at this time.        Obesity Treatment / Action Plan:  Patient will work on garnering support from family and friends to begin weight loss journey. Will work on eliminating or reducing the presence of highly palatable,  calorie dense foods in the home. Will complete provided nutritional and psychosocial assessment questionnaire before the next appointment. Will be scheduled for indirect calorimetry to determine resting energy expenditure in a fasting state.  This will allow [place] to create a reduced calorie, high-protein meal plan to promote loss of fat mass while preserving muscle mass. Will work on reducing intake of added sugars, simple sugars and processed carbs. Will avoid skipping meals which may result in increased hunger signals and overeating at certain times. Will work on managing stress via relaxation methods as this may result in unhealthy eating patterns. Counseled on the health benefits of losing 5%-15% of total body weight. Will work on improving sleep hygiene and trying to obtain at least 7 hours of sleep. Was counseled on nutritional approaches to weight loss and benefits of reducing processed foods and consuming plant-based foods and high quality protein as part of nutritional weight management. Was counseled on pharmacotherapy and role as an adjunct in weight management.   Obesity Education Performed Today:  She was weighed on the bioimpedance scale and results were discussed and documented in the synopsis.  We discussed obesity as a disease and the importance of a more detailed evaluation of all the factors contributing to the disease.  We discussed the importance of long term lifestyle changes which include nutrition, exercise and behavioral modifications as well as the importance of customizing this to her specific health and social needs.  We discussed the benefits of reaching a healthier weight to alleviate the symptoms of existing conditions and reduce the risks of the biomechanical, metabolic and psychological effects of obesity.  AYLEN STRADFORD appears to be in the action stage of change and states they are ready to start intensive lifestyle modifications and behavioral  modifications.  I have spent 30 minutes in the care of the patient today including: preparing to see patient (e.g. review and interpretation of tests, old notes ), obtaining and/or reviewing separately obtained history, performing a medically appropriate examination or evaluation, counseling and educating the patient, documenting clinical information in the electronic or other health care record, and independently interpreting results and communicating results to the patient, family, or caregiver   Reviewed by clinician on day of visit: allergies, medications, problem list, medical history, surgical history, family history, social history, and previous encounter notes pertinent to obesity diagnosis.   Taylon Louison,PA-C

## 2024-02-21 ENCOUNTER — Ambulatory Visit: Payer: 59 | Admitting: Adult Health

## 2024-02-29 ENCOUNTER — Encounter (INDEPENDENT_AMBULATORY_CARE_PROVIDER_SITE_OTHER): Payer: Self-pay | Admitting: Family Medicine

## 2024-02-29 ENCOUNTER — Ambulatory Visit (INDEPENDENT_AMBULATORY_CARE_PROVIDER_SITE_OTHER): Admitting: Family Medicine

## 2024-02-29 VITALS — BP 138/83 | HR 82 | Temp 97.8°F | Ht 64.5 in | Wt 225.0 lb

## 2024-02-29 DIAGNOSIS — R5383 Other fatigue: Secondary | ICD-10-CM | POA: Diagnosis not present

## 2024-02-29 DIAGNOSIS — Z6838 Body mass index (BMI) 38.0-38.9, adult: Secondary | ICD-10-CM

## 2024-02-29 DIAGNOSIS — F411 Generalized anxiety disorder: Secondary | ICD-10-CM

## 2024-02-29 DIAGNOSIS — R0602 Shortness of breath: Secondary | ICD-10-CM

## 2024-02-29 DIAGNOSIS — E1169 Type 2 diabetes mellitus with other specified complication: Secondary | ICD-10-CM

## 2024-02-29 DIAGNOSIS — E1159 Type 2 diabetes mellitus with other circulatory complications: Secondary | ICD-10-CM

## 2024-02-29 DIAGNOSIS — I152 Hypertension secondary to endocrine disorders: Secondary | ICD-10-CM

## 2024-02-29 DIAGNOSIS — Z903 Acquired absence of stomach [part of]: Secondary | ICD-10-CM

## 2024-02-29 DIAGNOSIS — Z7985 Long-term (current) use of injectable non-insulin antidiabetic drugs: Secondary | ICD-10-CM

## 2024-02-29 DIAGNOSIS — Z7984 Long term (current) use of oral hypoglycemic drugs: Secondary | ICD-10-CM

## 2024-02-29 DIAGNOSIS — E66812 Obesity, class 2: Secondary | ICD-10-CM | POA: Diagnosis not present

## 2024-02-29 DIAGNOSIS — Z1331 Encounter for screening for depression: Secondary | ICD-10-CM

## 2024-02-29 NOTE — Progress Notes (Signed)
 Ariel [name], D.O.  ABFM, ABOM Specializing in Clinical Bariatric Medicine Office located at: [address]  [place], [place]  [address]   Bariatric Medicine Visit  Dear Ariel [name], Ariel Sevin, NP-C   Thank you for referring Ariel [name] to our clinic today for evaluation.  We performed a consultation to discuss her options for treatment and educate the patient on her disease state.  The following note includes my evaluation and treatment recommendations.   Please do not hesitate to reach out to me directly if you have any further concerns.   Assessment and Plan:   Orders Placed This Encounter  Procedures   VITAMIN D  25 Hydroxy (Vit-D Deficiency, Fractures)   TSH   T4, free   Lipid panel   Insulin , random   Hemoglobin A1c   Comprehensive metabolic panel with GFR   CBC with Differential/Platelet   Vitamin B12   Folate   Iron  and TIBC   Ferritin   Vitamin B1   EKG 12-Lead   FOR THE DISEASE OF OBESITY:  BMI 38.0-38.9,adult Class 2 obesity - START BMI 38.04 (02/29/2024) Assessment & Plan: Ariel [name] is currently in the action stage of change. As such, her goal is to start our weight management plan.  She has agreed to implement the CAT 1 MP with Lunch options.    Behavioral Intervention We discussed the following today: begin to work on maintaining a reduced calorie state, getting the recommended amount of protein, incorporating whole foods, making healthy choices, staying well hydrated and practicing mindfulness when eating.  Additional resources provided today: Handout on CAT 1 meal plan  and Handout on CAT 1-2 lunch options  Evidence-based interventions for health behavior change were utilized today including the discussion of self monitoring techniques, problem-solving barriers and SMART goal setting techniques.    Pt will specifically work on measuring her intake of veggies and lean proteins and eliminating her liquid calorie intake.    Recommended Physical  Activity Goals Ariel [name] has been advised to work up to 150 minutes of moderate intensity aerobic activity a week and strengthening exercises 2-3 times per week for cardiovascular health, weight loss maintenance and preservation of muscle mass.   She has agreed to : maintain current level of activity.    Pharmacotherapy: Pt previously on Phentermine , however med was discontinued as she felt "very jittery" on it.    ASSOCIATED CONDITIONS ADDRESSED TODAY:  Fatigue Assessment & Plan: Ariel [name] does feel that her weight is causing her energy to be lower than it should be. Fatigue may be related to obesity, depression or many other causes. she does not appear to have any red flag symptoms and this appears to most likely be related to her current lifestyle habits and dietary intake.  Labs will be ordered and reviewed with her at their next office visit in two weeks.  Epworth sleepiness scale is 7 and appears to be within normal limits. Ariel [name] admits to some daytime somnolence and admits to waking up still tired. Ariel [name] generally gets 4-5 hours of sleep per night, and states that she has generally unrestful sleep. Snoring is occasionally present. Apneic episodes are not present.   Pt had a sleep study done through pulmonary in 2017 because of an elevated ESS, her snoring tendencies, and her elevated BMI. The study came back normal.   Encouraged pt to aim for 7-9 hrs of sleep per night for the best weight loss results and for her overall health/well being.   ECG: Performed and  reviewed/ interpreted independently.  Normal sinus rhythm, rate 68 bpm; reassuring without any acute abnormalities, will continue to monitor for symptoms    Shortness of breath on exertion Assessment & Plan: Ariel [name] does feel that she gets out of breath more easily than she used to when she exercises and seems to be worsening over time with weight gain.  This has gotten worse recently. Ariel [name] denies shortness of breath at rest or orthopnea.  Pt denies chest pain, dizziness, heart palpitations, or excessive diaphoresis or nausea with activity.  This is not new and is ongoing.  Ariel [name]'s shortness of breath appears to be obesity related and exercise induced, as they do not appear to have any "red flag" symptoms/ concerns today.  Also, this condition appears to be related to a state of poor cardiovascular conditioning   Obtain labs today and will be reviewed with her at their next office visit in two weeks.  Indirect Calorimeter completed today to help guide our dietary regimen. It shows a VO2 of 233 and a REE of 1613.  Her calculated basal metabolic rate is 5621 thus her resting energy expenditure is less than expected.  Patient agreed to work on weight loss at this time.  As Ariel [name] progresses through our weight loss program, we will gradually increase exercise as tolerated to treat her current condition.   If Ariel [name] follows our recommendations and loses 5-10% of their weight without improvement of her shortness of breath or if at any time, symptoms become more concerning, they agree to urgently follow up with their PCP/ specialist for further consideration/ evaluation.  Ariel [name] verbalizes agreement with this plan.    Type 2 diabetes mellitus with obesity (HCC) Assessment & Plan: Most recent A1c: Lab Results  Component Value Date   HGBA1C 5.8 05/13/2022   HGBA1C 5.8 01/08/2022   HGBA1C 5.5 04/24/2021    Diet/life style controlled; however pt was on Ozempic  and Metformin  in the past. This diagnosis was reviewed with the patient and education was provided. Begin working on nutrition plan to decrease simple carbohydrates, increase lean proteins and exercise to promote weight loss.   Hypertension associated with diabetes (HCC) Assessment & Plan: Last 3 blood pressure readings in our office are as follows: BP Readings from Last 3 Encounters:  02/29/24 138/83  01/26/24 (!) 154/90  12/20/23 113/65   The 10-year ASCVD risk score (Arnett DK, et  al., 2019) is: 14.1%  Lab Results  Component Value Date   CREATININE 0.79 05/13/2022   On Amlodipine  10 mg daily. BP is above goal. Pt asx today; however she endorses sometimes feeling a bit light headed. She does not check her BP.   Goal BP: 120/80 or less on a regular basis. Check BP at home at least 2-3 times weekly. Continue regimen and begin implementation of low sodium diet. We will check renal panel today and assess cardiovascular risk at the next OV.   S/P gastric sleeve procedure- 2017- Dr Odean Bend [facility] Assessment & Plan: H/o gastric sleeve in 2017 by Dr.David Odean Bend. Pre-op weight was 259 lbs. Her lowest weight was 199 lbs which she reached 3 months after her surgery. She regained this weight by 2019.    Begin implementation of medically supervised weight management plan; pt advised to eat multiple small meals throughout the day to get in all her foods. Will screen for nutritional deficiencies today.    GAD (generalized anxiety disorder)/Depression Screen  Assessment & Plan: Most recent PHQ-9 score:    02/29/2024    9:36  AM 11/23/2023    9:01 AM 08/23/2023    8:51 AM 06/28/2023   11:33 AM 01/08/2022    9:28 AM  Depression screen PHQ 2/9  Decreased Interest 1  0 0 0  Down, Depressed, Hopeless 0 0 0 0 0  PHQ - 2 Score 1 0 0 0 0  Altered sleeping 1 1 1 3    Tired, decreased energy 1 1 0 1   Change in appetite 1 1 1  0   Feeling bad or failure about yourself  0 0 0 0   Trouble concentrating 1 0 0 1   Moving slowly or fidgety/restless 0 0 0 1   Suicidal thoughts 0 0 0 0   PHQ-9 Score 5 3 2 6    Difficult doing work/chores Not difficult at all        Has been on Lexapro  for 5-6 months per Ob/GYN for anxiety, nervousness, and difficulty sleeping possibly secondary to menopause according to patient. Denies emotional eating tendencies.  Continue regimen. Informed pt about our bariatric psychologist; can refer if needed in the future. Start prudent nutrition plan which can support  emotional wellbeing.    FOLLOW UP:   Follow up in 2 weeks. She was informed of the importance of frequent follow up visits to maximize her success with intensive lifestyle modifications for her multiple health conditions.  Ariel [name] is aware that we will review all of her lab results at our next visit.  She is aware that if anything is critical/ life threatening with the results, we will be contacting her via MyChart prior to the office visit to discuss management.    Chief Complaint:   OBESITY AIRELLE EVERDING (MR# [id number]) is a pleasant 53 y.o. female who presents for evaluation and treatment of obesity and related comorbidities. Current BMI is Body mass index is 38.02 kg/m. WELTHA CATHY has been struggling with her weight for many years and has been unsuccessful in either losing weight, maintaining weight loss, or reaching her healthy weight goal.  LAKE BREEDING is currently in the action stage of change and ready to dedicate time achieving and maintaining a healthier weight. SIA GABRIELSEN is interested in becoming our patient and working on intensive lifestyle modifications including (but not limited to) diet and exercise for weight loss.  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  Patient is single and has 2 children. She lives with her 16 y.o daughter.  Started gaining wt "after childbirth/steroid injection".  Desires to be 190 lbs within 6 mos.   Walks 45 minutes 2x per week.   Eats fast food or take out 4+ times a week.   Craves chocolate.   Frequently snacks on chips after dinner.  Typically skips lunch daily.   States her family makes it challenging to eat healthy "b/c they eat unhealthy foods like pizza".  Current liquid calorie intake: 2 regular sodas daily, sweet tea with sugar, juice, and coffee with various flavorings.   Worst food habit: chocolate  Subjective:   This is the patient's first visit at  [facility].  The patient's NEW PATIENT PACKET that they filled out prior to today's office visit was reviewed at length and information from that paperwork was included within the following office visit note.    Included in the packet: current and past health history, medications, allergies, ROS, gynecologic history (women only), surgical history, family history, social history, weight history, weight  loss surgery history (for those that have had weight loss surgery), nutritional evaluation, mood and food questionnaire along with a depression screening (PHQ9) on all patients, an Epworth questionnaire, sleep habits questionnaire, patient life and health improvement goals questionnaire. These will all be scanned into the patient's chart under the "media" tab.   Review of Systems: Please refer to new patient packet scanned into media. Pertinent positives were addressed with patient today.  Reviewed by clinician on day of visit: allergies, medications, problem list, medical history, surgical history, family history, social history, and previous encounter notes.  During the visit, I independently reviewed the patient's EKG, bioimpedance scale results, and indirect calorimeter results. I used this information to tailor a meal plan for the patient that will help Ariel [name] to lose weight and will improve her obesity-related conditions going forward.  I performed a medically necessary appropriate examination and/or evaluation. I discussed the assessment and treatment plan with the patient. The patient was provided an opportunity to ask questions and all were answered. The patient agreed with the plan and demonstrated an understanding of the instructions. Labs were ordered today (unless patient declined them) and will be reviewed with the patient at our next visit unless more critical results need to be addressed immediately. Clinical information was updated and documented in the EMR.     Objective:   PHYSICAL EXAM: Blood pressure 138/83, pulse 82, temperature 97.8 F (36.6 C), height 5' 4.5" (1.638 m), weight 225 lb (102.1 kg), SpO2 99%. Body mass index is 38.02 kg/m.  General: Well Developed, well nourished, and in no acute distress.  HEENT: Normocephalic, atraumatic; EOMI, sclerae are anicteric. Skin: Warm and dry, good turgor Chest:  Normal excursion, shape, no gross ABN Respiratory: No conversational dyspnea; speaking in full sentences NeuroM-Sk:  Normal gross ROM * 4 extremities  Psych: A and O *3, insight adequate, mood- full   Anthropometric Measurements Height: 5' 4.5" (1.638 m) Weight: 225 lb (102.1 kg) BMI (Calculated): 38.04 Weight at Last Visit: 0lb Weight Lost Since Last Visit: 0lb Weight Gained Since Last Visit: 0lb Starting Weight: 225lb Total Weight Loss (lbs): 0 lb (0 kg) Peak Weight: 259lb Waist Measurement : 42 inches   Body Composition  Body Fat %: 45.9 % Fat Mass (lbs): 103.4 lbs Muscle Mass (lbs): 115.8 lbs Total Body Water (lbs): 84.6 lbs Visceral Fat Rating : 13   Other Clinical Data Fasting: No Labs: Yes Today's Visit #: 1 Starting Date: 02/29/24 Comments: First Visit   DIAGNOSTIC DATA REVIEWED:  BMET    Component Value Date/Time   NA 137 05/13/2022 0841   K 3.3 (L) 05/13/2022 0841   CL 101 05/13/2022 0841   CO2 28 05/13/2022 0841   GLUCOSE 86 05/13/2022 0841   BUN 10 05/13/2022 0841   CREATININE 0.79 05/13/2022 0841   CREATININE 0.80 04/24/2021 0831   CALCIUM  9.1 05/13/2022 0841   GFRNONAA 94 02/13/2021 1050   GFRAA 109 02/13/2021 1050   Lab Results  Component Value Date   HGBA1C 5.8 05/13/2022   HGBA1C 6.1 (H) 05/27/2012   No results found for: "INSULIN " Lab Results  Component Value Date   TSH 1.07 05/13/2022   CBC    Component Value Date/Time   WBC 4.9 01/08/2022 1001   RBC 4.63 01/08/2022 1001   HGB 12.6 01/08/2022 1001   HCT 37.6 01/08/2022 1001   PLT 287.0 01/08/2022 1001   MCV 81.2  01/08/2022 1001   MCH 26.5 (L) 04/24/2021 0831   MCHC 33.5 01/08/2022  1001   RDW 13.6 01/08/2022 1001   Iron  Studies    Component Value Date/Time   IRON  45 11/01/2020 1203   TIBC 336 11/01/2020 1203   FERRITIN 30 11/01/2020 1203   IRONPCTSAT 13 (L) 11/01/2020 1203   Lipid Panel     Component Value Date/Time   CHOL 139 05/13/2022 0841   TRIG 92.0 05/13/2022 0841   HDL 39.10 05/13/2022 0841   CHOLHDL 4 05/13/2022 0841   VLDL 18.4 05/13/2022 0841   LDLCALC 81 05/13/2022 0841   LDLCALC 134 (H) 04/24/2021 0831   Hepatic Function Panel     Component Value Date/Time   PROT 7.6 01/08/2022 1001   ALBUMIN 4.2 01/08/2022 1001   AST 21 01/08/2022 1001   ALT 16 01/08/2022 1001   ALKPHOS 62 01/08/2022 1001   BILITOT 0.6 01/08/2022 1001      Component Value Date/Time   TSH 1.07 05/13/2022 0841   Nutritional Lab Results  Component Value Date   VD25OH 35 11/01/2020   VD25OH 30 05/10/2019    Attestation Statements:   I, Special Puri, acting as a Stage manager for Marceil Sensor, DO., have compiled all relevant documentation for today's office visit on behalf of Marceil Sensor, DO, while in the presence of [employer], DO.  I have reviewed the above documentation for accuracy and completeness, and I agree with the above. Ariel [name], D.O.  The 21st Century Cures Act was signed into law in 2016 which includes the topic of electronic health records.  This provides immediate access to information in MyChart.  This includes consultation notes, operative notes, office notes, lab results and pathology reports.  If you have any questions about what you read please let us  know at your next visit so we can discuss your concerns and take corrective action if need be.  We are right here with you.

## 2024-03-01 LAB — COMPREHENSIVE METABOLIC PANEL WITH GFR
ALT: 16 IU/L (ref 0–32)
AST: 20 IU/L (ref 0–40)
Albumin: 4.5 g/dL (ref 3.8–4.9)
Alkaline Phosphatase: 99 IU/L (ref 44–121)
BUN/Creatinine Ratio: 12 (ref 9–23)
BUN: 9 mg/dL (ref 6–24)
Bilirubin Total: 0.5 mg/dL (ref 0.0–1.2)
CO2: 22 mmol/L (ref 20–29)
Calcium: 9.2 mg/dL (ref 8.7–10.2)
Chloride: 103 mmol/L (ref 96–106)
Creatinine, Ser: 0.74 mg/dL (ref 0.57–1.00)
Globulin, Total: 3.2 g/dL (ref 1.5–4.5)
Glucose: 104 mg/dL — ABNORMAL HIGH (ref 70–99)
Potassium: 3.9 mmol/L (ref 3.5–5.2)
Sodium: 139 mmol/L (ref 134–144)
Total Protein: 7.7 g/dL (ref 6.0–8.5)
eGFR: 96 mL/min/{1.73_m2} (ref >59)

## 2024-03-01 LAB — HEMOGLOBIN A1C
Est. average glucose Bld gHb Est-mCnc: 123 mg/dL
Hgb A1c MFr Bld: 5.9 % — ABNORMAL HIGH (ref 4.8–5.6)

## 2024-03-01 LAB — CBC WITH DIFFERENTIAL/PLATELET
Basophils Absolute: 0 10*3/uL (ref 0.0–0.2)
Basos: 1 %
EOS (ABSOLUTE): 0.1 10*3/uL (ref 0.0–0.4)
Eos: 2 %
Hematocrit: 40.4 % (ref 34.0–46.6)
Hemoglobin: 12.6 g/dL (ref 11.1–15.9)
Immature Grans (Abs): 0 10*3/uL (ref 0.0–0.1)
Immature Granulocytes: 0 %
Lymphocytes Absolute: 1.6 10*3/uL (ref 0.7–3.1)
Lymphs: 39 %
MCH: 26.5 pg — ABNORMAL LOW (ref 26.6–33.0)
MCHC: 31.2 g/dL — ABNORMAL LOW (ref 31.5–35.7)
MCV: 85 fL (ref 79–97)
Monocytes Absolute: 0.4 10*3/uL (ref 0.1–0.9)
Monocytes: 11 %
Neutrophils Absolute: 1.9 10*3/uL (ref 1.4–7.0)
Neutrophils: 47 %
Platelets: 266 10*3/uL (ref 150–450)
RBC: 4.76 x10E6/uL (ref 3.77–5.28)
RDW: 13.2 % (ref 11.7–15.4)
WBC: 4 10*3/uL (ref 3.4–10.8)

## 2024-03-01 LAB — INSULIN, RANDOM: INSULIN: 11.4 u[IU]/mL (ref 2.6–24.9)

## 2024-03-01 LAB — LIPID PANEL
Chol/HDL Ratio: 4.8 ratio — ABNORMAL HIGH (ref 0.0–4.4)
Cholesterol, Total: 234 mg/dL — ABNORMAL HIGH (ref 100–199)
HDL: 49 mg/dL (ref >39)
LDL Chol Calc (NIH): 163 mg/dL — ABNORMAL HIGH (ref 0–99)
Triglycerides: 124 mg/dL (ref 0–149)
VLDL Cholesterol Cal: 22 mg/dL (ref 5–40)

## 2024-03-01 LAB — TSH: TSH: 1.3 u[IU]/mL (ref 0.450–4.500)

## 2024-03-01 LAB — VITAMIN D 25 HYDROXY (VIT D DEFICIENCY, FRACTURES): Vit D, 25-Hydroxy: 28.2 ng/mL — ABNORMAL LOW (ref 30.0–100.0)

## 2024-03-01 LAB — T4, FREE: Free T4: 1.07 ng/dL (ref 0.82–1.77)

## 2024-03-21 ENCOUNTER — Encounter (INDEPENDENT_AMBULATORY_CARE_PROVIDER_SITE_OTHER): Payer: Self-pay | Admitting: Family Medicine

## 2024-03-21 ENCOUNTER — Ambulatory Visit (INDEPENDENT_AMBULATORY_CARE_PROVIDER_SITE_OTHER): Admitting: Family Medicine

## 2024-03-21 VITALS — BP 134/81 | HR 71 | Temp 98.1°F | Ht 64.5 in | Wt 230.0 lb

## 2024-03-21 DIAGNOSIS — E1169 Type 2 diabetes mellitus with other specified complication: Secondary | ICD-10-CM

## 2024-03-21 DIAGNOSIS — E785 Hyperlipidemia, unspecified: Secondary | ICD-10-CM

## 2024-03-21 DIAGNOSIS — I152 Hypertension secondary to endocrine disorders: Secondary | ICD-10-CM

## 2024-03-21 DIAGNOSIS — Z903 Acquired absence of stomach [part of]: Secondary | ICD-10-CM

## 2024-03-21 DIAGNOSIS — E669 Obesity, unspecified: Secondary | ICD-10-CM

## 2024-03-21 DIAGNOSIS — E1159 Type 2 diabetes mellitus with other circulatory complications: Secondary | ICD-10-CM

## 2024-03-21 DIAGNOSIS — Z6838 Body mass index (BMI) 38.0-38.9, adult: Secondary | ICD-10-CM

## 2024-03-21 DIAGNOSIS — E66812 Obesity, class 2: Secondary | ICD-10-CM

## 2024-03-21 DIAGNOSIS — E559 Vitamin D deficiency, unspecified: Secondary | ICD-10-CM

## 2024-03-21 MED ORDER — VITAMIN D (ERGOCALCIFEROL) 1.25 MG (50000 UNIT) PO CAPS: 50000.0000 [IU] | ORAL_CAPSULE | ORAL | 0 refills | Status: DC

## 2024-03-21 NOTE — Progress Notes (Signed)
 Ariel [name], D.O.  ABFM, ABOM Clinical Bariatric Medicine Physician  Office located at: [address] [place], [place]  [address]   Assessment and Plan:   Meds ordered this encounter  Medications   Vitamin D , Ergocalciferol , (DRISDOL ) 1.25 MG (50000 UNIT) CAPS capsule    Sig: Take 1 capsule (50,000 Units total) by mouth every 7 (seven) days.    Dispense:  5 capsule    Refill:  0    FOR THE DISEASE OF OBESITY:  BMI 38.0-38.9,adult Class 2 obesity - START BMI 38.04 (02/29/2024) Assessment & Plan: Since last office visit on 02/29/2024 patient's muscle mass has increased by 2 lb. Fat mass has increased by 3.2 lb. Total body water has increased by 4.8 lb.  Counseling done on how various foods will affect these numbers and how to maximize success  Total lbs lost to date: + 5 lbs  Total weight loss percentage to date: + 2.17%    Recommended Dietary Goals Eriko is currently in the action stage of change. As such, her goal is to continue weight management plan.  She has agreed to: continue current plan   Behavioral Intervention We discussed the following today: nutritional content of grapes, strategies to decrease boredom eating,  eating every 2-3 hrs, increasing lean protein intake to established goals and increasing water intake   Additional resources provided today: Handout on CAT 1 meal plan  and Handout on CAT 1-2 lunch options  Evidence-based interventions for health behavior change were utilized today including the discussion of self monitoring techniques, problem-solving barriers and SMART goal setting techniques.  Regarding patient's less desirable eating habits and patterns, we employed the technique of small changes.   Pt will specifically work on: n/a   Recommended Physical Activity Goals Irene has been advised to work up to 150 minutes of moderate intensity aerobic activity a week and strengthening exercises 2-3 times per week for cardiovascular health, weight  loss maintenance and preservation of muscle mass.   She has agreed to : Continue current level of physical activity    Pharmacotherapy See T2DM note.    ASSOCIATED CONDITIONS ADDRESSED TODAY:  Type 2 diabetes mellitus with obesity [facility]) Assessment & Plan: Lab Results  Component Value Date   HGBA1C 5.9 (H) 02/29/2024   HGBA1C 5.8 05/13/2022   HGBA1C 5.8 01/08/2022   INSULIN  11.4 02/29/2024   Lab Results  Component Value Date   WBC 4.0 02/29/2024   HGB 12.6 02/29/2024   HCT 40.4 02/29/2024   MCV 85 02/29/2024   PLT 266 02/29/2024   Lab Results  Component Value Date   CREATININE 0.74 02/29/2024   BUN 9 02/29/2024   NA 139 02/29/2024   K 3.9 02/29/2024   CL 103 02/29/2024   CO2 22 02/29/2024      Component Value Date/Time   PROT 7.7 02/29/2024 1052   ALBUMIN 4.5 02/29/2024 1052   AST 20 02/29/2024 1052   ALT 16 02/29/2024 1052   ALKPHOS 99 02/29/2024 1052   BILITOT 0.5 02/29/2024 1052   Lab Results  Component Value Date   TSH 1.300 02/29/2024   FREET4 1.07 02/29/2024    Has been on Metformin  in the past but states she did not tolerate it well. Was also on Ozempic  for a while and tolerated it well and lost weight.   She does not check her sugars at home.  Denies symptoms of hypoglycemia or hyperglycemia. Her A1c has been worsening since 04/2021. Her fasting insulin  is above  the goal of <5. Kidney function, electrolytes, liver enzymes, thyroid  levels, & blood counts are within acceptable ranges.   Patient aware of disease state and risk of progression. Explained role of simple carbs and insulin  levels on hunger and cravings. Educated patient that having adequate amounts of protein with each meal is important for increasing muscle mass, stabilizing sugars, controlling hunger and cravings, and improving thermogenesis.   Continue balanced diet focusing on iron  rich foods, protein, fruits, and vegetables while limiting simple carbohydrates. Losing 10% or more of body  weight may improve conditions. Consider restarting GLP-1 in the future.    Hypertension associated with diabetes (HCC) Assessment & Plan: Last 3 blood pressure readings in our office are as follows: BP Readings from Last 3 Encounters:  03/21/24 134/81  02/29/24 138/83  01/26/24 (!) 154/90   BP controlled on Amlodipine  10 mg daily. Continue regimen and low sodium diet. Increase exercise as able.    Hyperlipidemia associated with type 2 diabetes mellitus [facility]) Assessment & Plan: Lab Results  Component Value Date   CHOL 234 (H) 02/29/2024   HDL 49 02/29/2024   LDLCALC 163 (H) 02/29/2024   TRIG 124 02/29/2024   CHOLHDL 4.8 (H) 02/29/2024   The 10-year ASCVD risk score (Arnett DK, et al., 2019) is: 16%   Values used to calculate the score:     Age: 85 years     Sex: Female     Is Non-Hispanic African American: Yes     Diabetic: Yes     Tobacco smoker: No     Systolic Blood Pressure: 134 mmHg     Is BP treated: Yes     HDL Cholesterol: 49 mg/dL     Total Cholesterol: 234 mg/dL  Pt thinks she was on a cholesterol med in the past, but was taken off it. Her LDL is not at goal. Elevated LDL may be secondary to nutrition, genetics and spillover effect from excess adiposity. Recommended LDL goal is <70 for a diabetic. 10- year ASCVD risk score elevated.   Advised pt to discuss w/ her PCP about initiating a statin.  Limit red meat intake to no more than two days a week. Continue to work on nutrition plan -decreasing simple carbohydrates, increasing lean proteins, decreasing saturated fats and cholesterol , avoiding trans fats and exercise as able to promote weight loss, improve lipids and decrease cardiovascular risks.    S/P gastric sleeve procedure- 2017- Dr Odean Bend [facility] Assessment & Plan: H/o gastric sleeve in 2017 by Dr.David Odean Bend. Pre-op weight was 259 lbs. Her lowest weight was 199 lbs which she reached 3 months after her surgery. She regained this weight by 2019.    Pt  reminded to eat multiple small meals throughout the day to get in all her foods. Additionally, for an unknown reason, not all labs were drawn and so we will obtain the B1, Ferritin, Iron , TIBC, Folate, and B12 in the future.    Hypovitaminosis D Assessment & Plan: Lab Results  Component Value Date   VD25OH 28.2 (L) 02/29/2024   VD25OH 35 11/01/2020   VD25OH 30 05/10/2019   Her Vitamin D  levels are not at goal of 50 to 70.   Deficiency state associated with adiposity and may result in leptin resistance, weight gain and fatigue. After discussion of benefits, alternative treatment options and side effects patient will be STARTED on wkly prescription vitamin D . Recheck levels in 3-4 months.    FOLLOW UP:   Return 04/20/2024 at 9:40 AM. She  was informed of the importance of frequent follow up visits to maximize her success with intensive lifestyle modifications for her multiple health conditions.  Subjective:   Chief complaint: Obesity Juanna is here to discuss her progress with her obesity treatment plan. She is on  the Category 1 Plan with L options and states she is following her eating plan approximately 50 % of the time. She states she is walking 50 minutes 3 days per week.  Interval History:  LYAH MILLIRONS is here today for her first follow-up office visit since starting the program with us .   Since last OV, pt is up 5 lbs.   Not measuring her veggies/lean protein intake.   Typical lunch: half a salad from work [employer].   Typical dinner: other half of salad.   Admits to getting up in the middle of the night and eating grapes.   Occasionally engages in boredom eating.   Sleeps 7-8 hrs daily on average.    Pharmacotherapy for weight loss: none  Review of Systems:  Pertinent positives were addressed with patient today.  Reviewed by clinician on day of visit: allergies, medications, problem list, medical history, surgical history, family history, social history, and  previous encounter notes.  Weight Summary and Biometrics   Weight Lost Since Last Visit: 0  Weight Gained Since Last Visit: 5lb   Vitals Temp: 98.1 F (36.7 C) BP: 134/81 Pulse Rate: 71 SpO2: 97 %   Anthropometric Measurements Height: 5' 4.5 (1.638 m) Weight: 230 lb (104.3 kg) BMI (Calculated): 38.88 Weight at Last Visit: 225lb Weight Lost Since Last Visit: 0 Weight Gained Since Last Visit: 5lb Starting Weight: 225lb Total Weight Loss (lbs): 0 lb (0 kg) Peak Weight: 259lb   Body Composition  Body Fat %: 46.2 % Fat Mass (lbs): 106.6 lbs Muscle Mass (lbs): 117.8 lbs Total Body Water (lbs): 89.4 lbs Visceral Fat Rating : 14   Other Clinical Data Fasting: no Labs: no Today's Visit #: 2 Starting Date: 02/29/24     Objective:   PHYSICAL EXAM:  Blood pressure 134/81, pulse 71, temperature 98.1 F (36.7 C), height 5' 4.5 (1.638 m), weight 230 lb (104.3 kg), SpO2 97%. Body mass index is 38.87 kg/m.  General: she is overweight, cooperative and in no acute distress.   HEENT: EOMI, sclerae are anicteric. Lungs: Normal breathing effort, no conversational dyspnea. M-Sk:  Normal gross ROM * 4 extremities  PSYCH: Has normal mood, affect and thought process. Neurologic: No gross sensory or motor deficits. Well developed, A and O * 3  DIAGNOSTIC DATA REVIEWED:  BMET    Component Value Date/Time   NA 139 02/29/2024 1052   K 3.9 02/29/2024 1052   CL 103 02/29/2024 1052   CO2 22 02/29/2024 1052   GLUCOSE 104 (H) 02/29/2024 1052   GLUCOSE 86 05/13/2022 0841   BUN 9 02/29/2024 1052   CREATININE 0.74 02/29/2024 1052   CREATININE 0.80 04/24/2021 0831   CALCIUM  9.2 02/29/2024 1052   GFRNONAA 94 02/13/2021 1050   GFRAA 109 02/13/2021 1050   Lab Results  Component Value Date   HGBA1C 5.9 (H) 02/29/2024   HGBA1C 6.1 (H) 05/27/2012   Lab Results  Component Value Date   INSULIN  11.4 02/29/2024   Lab Results  Component Value Date   TSH 1.300 02/29/2024    CBC    Component Value Date/Time   WBC 4.0 02/29/2024 1052   WBC 4.9 01/08/2022 1001   RBC 4.76 02/29/2024 1052   RBC 4.63 01/08/2022  1001   HGB 12.6 02/29/2024 1052   HCT 40.4 02/29/2024 1052   PLT 266 02/29/2024 1052   MCV 85 02/29/2024 1052   MCH 26.5 (L) 02/29/2024 1052   MCH 26.5 (L) 04/24/2021 0831   MCHC 31.2 (L) 02/29/2024 1052   MCHC 33.5 01/08/2022 1001   RDW 13.2 02/29/2024 1052   Iron  Studies    Component Value Date/Time   IRON  45 11/01/2020 1203   TIBC 336 11/01/2020 1203   FERRITIN 30 11/01/2020 1203   IRONPCTSAT 13 (L) 11/01/2020 1203   Lipid Panel     Component Value Date/Time   CHOL 234 (H) 02/29/2024 1052   TRIG 124 02/29/2024 1052   HDL 49 02/29/2024 1052   CHOLHDL 4.8 (H) 02/29/2024 1052   CHOLHDL 4 05/13/2022 0841   VLDL 18.4 05/13/2022 0841   LDLCALC 163 (H) 02/29/2024 1052   LDLCALC 134 (H) 04/24/2021 0831   Hepatic Function Panel     Component Value Date/Time   PROT 7.7 02/29/2024 1052   ALBUMIN 4.5 02/29/2024 1052   AST 20 02/29/2024 1052   ALT 16 02/29/2024 1052   ALKPHOS 99 02/29/2024 1052   BILITOT 0.5 02/29/2024 1052      Component Value Date/Time   TSH 1.300 02/29/2024 1052   Nutritional Lab Results  Component Value Date   VD25OH 28.2 (L) 02/29/2024   VD25OH 35 11/01/2020   VD25OH 30 05/10/2019    Attestations:   I, Special Puri , acting as a Stage manager for Marceil Sensor, DO., have compiled all relevant documentation for today's office visit on behalf of Marceil Sensor, DO, while in the presence of [employer], DO.  I have reviewed the above documentation for accuracy and completeness, and I agree with the above. Ariel [name], D.O.  The 21st Century Cures Act was signed into law in 2016 which includes the topic of electronic health records.  This provides immediate access to information in MyChart.  This includes consultation notes, operative notes, office notes, lab results and pathology reports.  If  you have any questions about what you read please let us  know at your next visit so we can discuss your concerns and take corrective action if need be.  We are right here with you.

## 2024-04-20 ENCOUNTER — Encounter (INDEPENDENT_AMBULATORY_CARE_PROVIDER_SITE_OTHER): Payer: Self-pay | Admitting: Family Medicine

## 2024-04-20 ENCOUNTER — Ambulatory Visit (INDEPENDENT_AMBULATORY_CARE_PROVIDER_SITE_OTHER): Admitting: Family Medicine

## 2024-04-20 VITALS — BP 138/85 | HR 77 | Temp 98.6°F | Ht 64.5 in | Wt 231.0 lb

## 2024-04-20 DIAGNOSIS — Z6839 Body mass index (BMI) 39.0-39.9, adult: Secondary | ICD-10-CM

## 2024-04-20 DIAGNOSIS — E1159 Type 2 diabetes mellitus with other circulatory complications: Secondary | ICD-10-CM

## 2024-04-20 DIAGNOSIS — I152 Hypertension secondary to endocrine disorders: Secondary | ICD-10-CM

## 2024-04-20 DIAGNOSIS — Z903 Acquired absence of stomach [part of]: Secondary | ICD-10-CM

## 2024-04-20 DIAGNOSIS — E1169 Type 2 diabetes mellitus with other specified complication: Secondary | ICD-10-CM | POA: Diagnosis not present

## 2024-04-20 DIAGNOSIS — E559 Vitamin D deficiency, unspecified: Secondary | ICD-10-CM

## 2024-04-20 DIAGNOSIS — E66812 Obesity, class 2: Secondary | ICD-10-CM

## 2024-04-20 DIAGNOSIS — E785 Hyperlipidemia, unspecified: Secondary | ICD-10-CM

## 2024-04-20 DIAGNOSIS — Z6838 Body mass index (BMI) 38.0-38.9, adult: Secondary | ICD-10-CM

## 2024-04-20 DIAGNOSIS — Z7984 Long term (current) use of oral hypoglycemic drugs: Secondary | ICD-10-CM

## 2024-04-20 MED ORDER — VITAMIN D (ERGOCALCIFEROL) 1.25 MG (50000 UNIT) PO CAPS: 50000.0000 [IU] | ORAL_CAPSULE | ORAL | 0 refills | Status: DC

## 2024-04-20 MED ORDER — METFORMIN HCL 500 MG PO TABS: ORAL_TABLET | ORAL | 1 refills | Status: DC

## 2024-04-21 NOTE — Progress Notes (Signed)
 Ariel [name], D.O.  ABFM, ABOM Specializing in Clinical Bariatric Medicine  Office located at: [address] [place], [place]  [address]   Assessment and Plan:  No orders of the defined types were placed in this encounter.   Medications Discontinued During This Encounter  Medication Reason   Vitamin D , Ergocalciferol , (DRISDOL ) 1.25 MG (50000 UNIT) CAPS capsule Reorder     Meds ordered this encounter  Medications   Vitamin D , Ergocalciferol , (DRISDOL ) 1.25 MG (50000 UNIT) CAPS capsule    Sig: Take 1 capsule (50,000 Units total) by mouth every 7 (seven) days.    Dispense:  5 capsule    Refill:  0   metFORMIN  (GLUCOPHAGE ) 500 MG tablet    Sig: 1/2 po with lunch and dinner daily    Dispense:  30 tablet    Refill:  1    30 d supply;  ** OV for RF **   Do not send RF request      FOR THE DISEASE OF OBESITY:  S/P gastric sleeve procedure- 2017- Dr Ethyl [facility]  Hyperlipidemia associated with type 2 diabetes mellitus (HCC)  Hypertension associated with diabetes (HCC)  Type 2 diabetes mellitus with obesity (HCC) -     metFORMIN  HCl; 1/2 po with lunch and dinner daily  Dispense: 30 tablet; Refill: 1  Hypovitaminosis D -     Vitamin D  (Ergocalciferol ); Take 1 capsule (50,000 Units total) by mouth every 7 (seven) days.  Dispense: 5 capsule; Refill: 0  BMI 38.0-38.9,adult current 39.04  Class 2 obesity - START BMI 38.04 (02/29/2024)     Since last office visit on *** patient's muscle mass has {DID:29233} by ***lbs. Fat mass has {DID:29233} by ***lbs. Total body water has {DID:29233} by ***lbs.  Counseling done on how various foods will affect these numbers and how to maximize success  Total lbs lost to date: *** Total weight loss percentage to date: ***    Recommended Dietary Goals Ariel [name] is currently in the action stage of change. As such, her goal is to continue weight management plan.  She has agreed to: {EMWTLOSSPLAN:29297::continue current  plan}   Behavioral Intervention We discussed the following today: {dowtlossstrategies:31654}  Additional resources provided today: {DOhandouts:31655::None}  Evidence-based interventions for health behavior change were utilized today including the discussion of self monitoring techniques, problem-solving barriers and SMART goal setting techniques.   Regarding patient's less desirable eating habits and patterns, we employed the technique of small changes.   Pt will specifically work on: ***    Recommended Physical Activity Goals Ariel [name] has been advised to work up to 300-450 minutes of moderate intensity aerobic activity a week and strengthening exercises 2-3 times per week for cardiovascular health, weight loss maintenance and preservation of muscle mass.   She has agreed to: {EMEXERCISE:28847::Think about enjoyable ways to increase daily physical activity and overcoming barriers to exercise,Increase physical activity in their day and reduce sedentary time (increase NEAT).}   Pharmacotherapy We both agreed to: {EMagreedrx:29170}   ASSOCIATED CONDITIONS ADDRESSED TODAY:  S/P gastric sleeve procedure- 2017- Dr Ethyl [facility]  Hyperlipidemia associated with type 2 diabetes mellitus (HCC)  Hypertension associated with diabetes (HCC)  Type 2 diabetes mellitus with obesity (HCC) -     metFORMIN  HCl; 1/2 po with lunch and dinner daily  Dispense: 30 tablet; Refill: 1  Hypovitaminosis D -     Vitamin D  (Ergocalciferol ); Take 1 capsule (50,000 Units total) by mouth every 7 (seven) days.  Dispense: 5 capsule; Refill: 0  BMI 38.0-38.9,adult current 39.04  Class 2 obesity - START BMI 38.04 (02/29/2024)      Follow up:   Return in about 4 weeks (around 05/18/2024).*** She was informed of the importance of frequent follow up visits to maximize her success with intensive lifestyle modifications for her multiple health conditions.   Subjective:   Chief complaint: Obesity Ariel [name] is here  to discuss her progress with her obesity treatment plan. She is on {MWMwtlossportion/plan2:23431} and states she is following her eating plan approximately ***% of the time. She states she is *** minutes *** days per week OR  *** not exercising (delete one)  Interval History:  Ariel [name] is here for a follow up office visit. Since last OV on *** , she is ***.      (Use this table if you want, if not delete)   Meal Content  1st meal Time: *** Food choice: *** Beverage: ***  2nd Meal Time: *** Food choice: *** Beverage: ***  3rd Meal Time: *** Food choice: *** Beverage: ***  Snacks/Desserts AM: *** PM: *** Evening: ***  Oral Fluids *** ETOH: ***       Pharmacotherapy that aid with weight loss: She is currently taking {EMPharmaco:28845}.   Review of Systems:  Pertinent positives were addressed with patient today.  Reviewed by clinician on day of visit: allergies, medications, problem list, medical history, surgical history, family history, social history, and previous encounter notes.  Weight Summary and Biometrics   Weight Lost Since Last Visit: 0lb  Weight Gained Since Last Visit: 1lb  ***  Vitals Temp: 98.6 F (37 C) BP: 138/85 Pulse Rate: 77 SpO2: 98 %   Anthropometric Measurements Height: 5' 4.5 (1.638 m) Weight: 231 lb (104.8 kg) BMI (Calculated): 39.05 Weight at Last Visit: 230lb Weight Lost Since Last Visit: 0lb Weight Gained Since Last Visit: 1lb Starting Weight: 225lb Total Weight Loss (lbs): 0 lb (0 kg) Peak Weight: 259lb Waist Measurement : 42 inches   Body Composition  Body Fat %: 45.9 % Fat Mass (lbs): 106.4 lbs Muscle Mass (lbs): 118.8 lbs Total Body Water (lbs): 87 lbs Visceral Fat Rating : 14   Other Clinical Data RMR: [id number] Fasting: No Labs: no Today's Visit #: 3 Starting Date: 02/29/24 Comments: Cat 1 L options    Objective:   PHYSICAL EXAM: Blood pressure 138/85, pulse 77, temperature 98.6 F (37 C), height  5' 4.5 (1.638 m), weight 231 lb (104.8 kg), SpO2 98%. Body mass index is 39.04 kg/m.  General: she is overweight, cooperative and in no acute distress. PSYCH: Has normal mood, affect and thought process.   HEENT: EOMI, sclerae are anicteric. Lungs: Normal breathing effort, no conversational dyspnea. Extremities: Moves * 4 Neurologic: A and O * 3, good insight  DIAGNOSTIC DATA REVIEWED: BMET    Component Value Date/Time   NA 139 02/29/2024 1052   K 3.9 02/29/2024 1052   CL 103 02/29/2024 1052   CO2 22 02/29/2024 1052   GLUCOSE 104 (H) 02/29/2024 1052   GLUCOSE 86 05/13/2022 0841   BUN 9 02/29/2024 1052   CREATININE 0.74 02/29/2024 1052   CREATININE 0.80 04/24/2021 0831   CALCIUM  9.2 02/29/2024 1052   GFRNONAA 94 02/13/2021 1050   GFRAA 109 02/13/2021 1050   Lab Results  Component Value Date   HGBA1C 5.9 (H) 02/29/2024   HGBA1C 6.1 (H) 05/27/2012   Lab Results  Component Value Date   INSULIN  11.4 02/29/2024   Lab Results  Component Value Date  TSH 1.300 02/29/2024   CBC    Component Value Date/Time   WBC 4.0 02/29/2024 1052   WBC 4.9 01/08/2022 1001   RBC 4.76 02/29/2024 1052   RBC 4.63 01/08/2022 1001   HGB 12.6 02/29/2024 1052   HCT 40.4 02/29/2024 1052   PLT 266 02/29/2024 1052   MCV 85 02/29/2024 1052   MCH 26.5 (L) 02/29/2024 1052   MCH 26.5 (L) 04/24/2021 0831   MCHC 31.2 (L) 02/29/2024 1052   MCHC 33.5 01/08/2022 1001   RDW 13.2 02/29/2024 1052   Iron  Studies    Component Value Date/Time   IRON  45 11/01/2020 1203   TIBC 336 11/01/2020 1203   FERRITIN 30 11/01/2020 1203   IRONPCTSAT 13 (L) 11/01/2020 1203   Lipid Panel     Component Value Date/Time   CHOL 234 (H) 02/29/2024 1052   TRIG 124 02/29/2024 1052   HDL 49 02/29/2024 1052   CHOLHDL 4.8 (H) 02/29/2024 1052   CHOLHDL 4 05/13/2022 0841   VLDL 18.4 05/13/2022 0841   LDLCALC 163 (H) 02/29/2024 1052   LDLCALC 134 (H) 04/24/2021 0831   Hepatic Function Panel     Component Value  Date/Time   PROT 7.7 02/29/2024 1052   ALBUMIN 4.5 02/29/2024 1052   AST 20 02/29/2024 1052   ALT 16 02/29/2024 1052   ALKPHOS 99 02/29/2024 1052   BILITOT 0.5 02/29/2024 1052      Component Value Date/Time   TSH 1.300 02/29/2024 1052   Nutritional Lab Results  Component Value Date   VD25OH 28.2 (L) 02/29/2024   VD25OH 35 11/01/2020   VD25OH 30 05/10/2019    Attestations:   I, ***, acting as a Stage manager for Ariel Jenkins, DO., have compiled all relevant documentation for today's office visit on behalf of Ariel Jenkins, DO, while in the presence of [employer], DO.  Reviewed by clinician on day of visit: allergies, medications, problem list, medical history, surgical history, family history, social history, and previous encounter notes pertinent to patient's obesity diagnosis. I have spent 40 *** minutes in the care of the patient today including: preparing to see patient (e.g. review and interpretation of tests, old notes ), obtaining and/or reviewing separately obtained history, performing a medically appropriate examination or evaluation, counseling and educating the patient, ordering medications, test or procedures, documenting clinical information in the electronic or other health care record, and independently interpreting results and communicating results to the patient, family, or caregiver   I have reviewed the above documentation for accuracy and completeness, and I agree with the above. Ariel JINNY [name], D.O.  The 21st Century Cures Act was signed into law in 2016 which includes the topic of electronic health records.  This provides immediate access to information in MyChart.  This includes consultation notes, operative notes, office notes, lab results and pathology reports.  If you have any questions about what you read please let us  know at your next visit so we can discuss your concerns and take corrective action if need be.  We are right here with you.

## 2024-05-15 ENCOUNTER — Encounter (INDEPENDENT_AMBULATORY_CARE_PROVIDER_SITE_OTHER): Payer: Self-pay | Admitting: Family Medicine

## 2024-05-15 ENCOUNTER — Ambulatory Visit (INDEPENDENT_AMBULATORY_CARE_PROVIDER_SITE_OTHER): Admitting: Family Medicine

## 2024-05-15 VITALS — BP 151/82 | HR 76 | Temp 97.9°F | Ht 64.5 in | Wt 231.0 lb

## 2024-05-15 DIAGNOSIS — E1159 Type 2 diabetes mellitus with other circulatory complications: Secondary | ICD-10-CM | POA: Diagnosis not present

## 2024-05-15 DIAGNOSIS — Z6838 Body mass index (BMI) 38.0-38.9, adult: Secondary | ICD-10-CM

## 2024-05-15 DIAGNOSIS — Z6839 Body mass index (BMI) 39.0-39.9, adult: Secondary | ICD-10-CM | POA: Diagnosis not present

## 2024-05-15 DIAGNOSIS — E66812 Obesity, class 2: Secondary | ICD-10-CM | POA: Diagnosis not present

## 2024-05-15 DIAGNOSIS — I152 Hypertension secondary to endocrine disorders: Secondary | ICD-10-CM

## 2024-05-15 DIAGNOSIS — Z7985 Long-term (current) use of injectable non-insulin antidiabetic drugs: Secondary | ICD-10-CM | POA: Diagnosis not present

## 2024-05-15 DIAGNOSIS — E559 Vitamin D deficiency, unspecified: Secondary | ICD-10-CM | POA: Diagnosis not present

## 2024-05-15 DIAGNOSIS — E1169 Type 2 diabetes mellitus with other specified complication: Secondary | ICD-10-CM

## 2024-05-15 NOTE — Progress Notes (Signed)
 Ariel DOROTHA [name], D.O.  ABFM, ABOM Specializing in Clinical Bariatric Medicine  Office located at: [address] [place], [place]  [address]   Assessment and Plan:  No orders of the defined types were placed in this encounter.   There are no discontinued medications.   No orders of the defined types were placed in this encounter.    FOR THE DISEASE OF OBESITY:  BMI 38.0-38.9,adult current 39.04 Class 2 obesity - START BMI 38.04 (02/29/2024) Assessment & Plan: Since last office visit on 04/20/2024 patient's muscle mass has decreased by 1.2 lbs. Fat mass has increased by 0.8 lbs. Total body water has decreased by 1.2 lbs.  Body fat % has increased by 0.5  %. Counseling done on how various foods will affect these numbers and how to maximize success  Total lbs lost to date: + 6  lbs Total weight loss percentage to date: +2.67 %   Recommended Dietary Goals Ariel [name] is currently in the action stage of change. As such, her goal is to continue weight management plan.  She was provided official journaling parameters today: 1100 calories and 80++ grams protein daily. Use Cat 1 MP as a guide.    Behavioral Intervention We discussed the following today: proper sleep hygiene, eating regularly (every 3-5 hours), affordable sources of lean proteins, increasing lean protein intake to established goals, work on tracking and journaling calories using tracking application, continue to practice mindfulness when eating, and focusing on food with a 10:1 ratio of calories: grams of protein  Additional resources provided today: Handout on Daily Food Journaling Log  Evidence-based interventions for health behavior change were utilized today including the discussion of self monitoring techniques, problem-solving barriers and SMART goal setting techniques.   Regarding patient's less desirable eating habits and patterns, we employed the technique of small changes.   Goal: n/a   Recommended Physical  Activity Goals Ariel [name] has been advised to work up to 300-450 minutes of moderate intensity aerobic activity a week and strengthening exercises 2-3 times per week for cardiovascular health, weight loss maintenance and preservation of muscle mass.   She may continue to gradually increase the amount and intensity of exercise routine   Pharmacotherapy See T2DM note.    ASSOCIATED CONDITIONS ADDRESSED TODAY:  Type 2 diabetes mellitus with obesity Ariel [name]) Assessment & Plan: Lab Results  Component Value Date   HGBA1C 5.9 (H) 02/29/2024   HGBA1C 5.8 05/13/2022   HGBA1C 5.8 01/08/2022   INSULIN  11.4 02/29/2024   HgbA1c is at goal for age and comorbid conditions. Denies symptoms of hypoglycemia or hyperglycemia. On Metformin  500 mg one tablet daily (restarted it 2 weeks ago). Overall she's tolerating the Metformin  well but had GI upset on 2 occasions when eating off-plan foods.   - Pt desires to maintain at current dose of Metformin  for now.  -  Emphasized that the more she eats off-plan, the more likely for her to have side effects of the drug  - Consider restarting Ozempic  next OV if she adheres well to her journaling parameters.  - Continue to decrease simple carbs/ sugars; increase fiber and proteins  - Pt is due to see her primary care on 06/13/2024 for further care.     Hypertension associated with diabetes (HCC) Assessment & Plan: Last 3 blood pressure readings in our office are as follows: BP Readings from Last 3 Encounters:  05/15/24 (!) 151/82  04/20/24 138/85  03/21/24 134/81   The 10-year ASCVD risk score (Arnett DK, et  al., 2019) is: 23.6%  Lab Results  Component Value Date   CREATININE 0.74 02/29/2024   Pt reports compliance with Amlodipine  10 mg daily. BP is above target today; pt not quite sure why. Pt asx. Systolic BP averages 134 at work.   - Continue regimen.  - Check BP at home 3-4 times/weekly. Consider Omron Series 3 BP cuff.  - Goal BP: less than 120/80 on  regular basis.  - Continue with low sodium diet  - Advance exercise as tolerated - Pt is due to see her primary care on 06/13/2024 for further care.     Hypovitaminosis D Assessment & Plan: Lab Results  Component Value Date   VD25OH 28.2 (L) 02/29/2024   VD25OH 35 11/01/2020   VD25OH 30 05/10/2019   Currently on Ergocalciferol  50,000 units weekly without any adverse effects such as nausea, vomiting or muscle weakness. No acute concerns.   - Continue Vit D supplementation and wt loss efforts.  - Recheck 1-2 months.     Follow up:   No follow-ups on file.  She was informed of the importance of frequent follow up visits to maximize her success with intensive lifestyle modifications for her multiple health conditions.   Subjective:   Chief complaint: Obesity Ariel [name] is here to discuss her progress with her obesity treatment plan. She is journaling with the Category 1 MP as a guide and states she is following her eating plan approximately 50-60% of the time. She states she is exercising 30 minutes 3-4 days per week.   Interval History:  Ariel [name] is here for a follow up office visit. Since last OV on 04/20/2024 , her weight has not changed. She wrote down the foods she ate but not the calories and grams of protein. She occasionally skips meals. States it can sometimes be expensive for her to buy healthy foods. She does not sleep 7-9 hours per night.    Pharmacotherapy that aid with weight loss: Metformin  500 mg 0.5 tablet with lunch and dinner daily.   Review of Systems:  Pertinent positives were addressed with patient today.  Reviewed by clinician on day of visit: allergies, medications, problem list, medical history, surgical history, family history, social history, and previous encounter notes.  Weight Summary and Biometrics   Weight Lost Since Last Visit: 0  Weight Gained Since Last Visit: 0    Vitals Temp: 97.9 F (36.6 C) BP: (!) 151/82 Pulse Rate: 76 SpO2:  99 %   Anthropometric Measurements Height: 5' 4.5 (1.638 m) Weight: 231 lb (104.8 kg) BMI (Calculated): 39.05 Weight at Last Visit: 231lb Weight Lost Since Last Visit: 0 Weight Gained Since Last Visit: 0 Starting Weight: 225lb Total Weight Loss (lbs): 0 lb (0 kg) Peak Weight: 259lb   Body Composition  Body Fat %: 46.4 % Fat Mass (lbs): 107.2 lbs Muscle Mass (lbs): 117.6 lbs Total Body Water (lbs): 85.8 lbs Visceral Fat Rating : 14   Other Clinical Data Fasting: no Labs: no Today's Visit #: 4 Starting Date: 02/29/24    Objective:   PHYSICAL EXAM: Blood pressure (!) 151/82, pulse 76, temperature 97.9 F (36.6 C), height 5' 4.5 (1.638 m), weight 231 lb (104.8 kg), SpO2 99%. Body mass index is 39.04 kg/m.  General: she is overweight, cooperative and in no acute distress. PSYCH: Has normal mood, affect and thought process.   HEENT: EOMI, sclerae are anicteric. Lungs: Normal breathing effort, no conversational dyspnea. Extremities: Moves * 4 Neurologic: A and O * 3, good insight  DIAGNOSTIC DATA REVIEWED: BMET    Component Value Date/Time   NA 139 02/29/2024 1052   K 3.9 02/29/2024 1052   CL 103 02/29/2024 1052   CO2 22 02/29/2024 1052   GLUCOSE 104 (H) 02/29/2024 1052   GLUCOSE 86 05/13/2022 0841   BUN 9 02/29/2024 1052   CREATININE 0.74 02/29/2024 1052   CREATININE 0.80 04/24/2021 0831   CALCIUM  9.2 02/29/2024 1052   GFRNONAA 94 02/13/2021 1050   GFRAA 109 02/13/2021 1050   Lab Results  Component Value Date   HGBA1C 5.9 (H) 02/29/2024   HGBA1C 6.1 (H) 05/27/2012   Lab Results  Component Value Date   INSULIN  11.4 02/29/2024   Lab Results  Component Value Date   TSH 1.300 02/29/2024   CBC    Component Value Date/Time   WBC 4.0 02/29/2024 1052   WBC 4.9 01/08/2022 1001   RBC 4.76 02/29/2024 1052   RBC 4.63 01/08/2022 1001   HGB 12.6 02/29/2024 1052   HCT 40.4 02/29/2024 1052   PLT 266 02/29/2024 1052   MCV 85 02/29/2024 1052   MCH 26.5  (L) 02/29/2024 1052   MCH 26.5 (L) 04/24/2021 0831   MCHC 31.2 (L) 02/29/2024 1052   MCHC 33.5 01/08/2022 1001   RDW 13.2 02/29/2024 1052   Iron  Studies    Component Value Date/Time   IRON  45 11/01/2020 1203   TIBC 336 11/01/2020 1203   FERRITIN 30 11/01/2020 1203   IRONPCTSAT 13 (L) 11/01/2020 1203   Lipid Panel     Component Value Date/Time   CHOL 234 (H) 02/29/2024 1052   TRIG 124 02/29/2024 1052   HDL 49 02/29/2024 1052   CHOLHDL 4.8 (H) 02/29/2024 1052   CHOLHDL 4 05/13/2022 0841   VLDL 18.4 05/13/2022 0841   LDLCALC 163 (H) 02/29/2024 1052   LDLCALC 134 (H) 04/24/2021 0831   Hepatic Function Panel     Component Value Date/Time   PROT 7.7 02/29/2024 1052   ALBUMIN 4.5 02/29/2024 1052   AST 20 02/29/2024 1052   ALT 16 02/29/2024 1052   ALKPHOS 99 02/29/2024 1052   BILITOT 0.5 02/29/2024 1052      Component Value Date/Time   TSH 1.300 02/29/2024 1052   Nutritional Lab Results  Component Value Date   VD25OH 28.2 (L) 02/29/2024   VD25OH 35 11/01/2020   VD25OH 30 05/10/2019    Attestations:   I, Special Puri, acting as a Stage manager for Ariel Jenkins, DO., have compiled all relevant documentation for today's office visit on behalf of Ariel Jenkins, DO, while in the presence of [employer], DO.  I have spent 40 minutes in the care of the patient today including 31 minutes face-to-face assessing and reviewing listed medical problems above as outlined in office visit note and providing nutritional and behavioral counseling as outlined in obesity care plan.   I have reviewed the above documentation for accuracy and completeness, and I agree with the above. Ariel [name], D.O.  The 21st Century Cures Act was signed into law in 2016 which includes the topic of electronic health records.  This provides immediate access to information in MyChart.  This includes consultation notes, operative notes, office notes, lab results and pathology reports.  If you  have any questions about what you read please let us  know at your next visit so we can discuss your concerns and take corrective action if need be.  We are right here with you.

## 2024-05-17 ENCOUNTER — Ambulatory Visit
Admission: EM | Admit: 2024-05-17 | Discharge: 2024-05-17 | Disposition: A | Discharge status: Home / Self Care | Attending: Nurse Practitioner | Admitting: Nurse Practitioner

## 2024-05-17 DIAGNOSIS — R030 Elevated blood-pressure reading, without diagnosis of hypertension: Secondary | ICD-10-CM

## 2024-05-17 DIAGNOSIS — R42 Dizziness and giddiness: Secondary | ICD-10-CM | POA: Diagnosis not present

## 2024-05-17 DIAGNOSIS — H6991 Unspecified Eustachian tube disorder, right ear: Secondary | ICD-10-CM

## 2024-05-17 MED ORDER — PREDNISONE 20 MG PO TABS
40.0000 mg | ORAL_TABLET | Freq: Every day | ORAL | 0 refills | Status: AC
Start: 2024-05-17 — End: 2024-05-22

## 2024-05-17 MED ORDER — MECLIZINE HCL 12.5 MG PO TABS: 12.5000 mg | ORAL_TABLET | Freq: Three times a day (TID) | ORAL | 0 refills | Status: DC | PRN

## 2024-05-17 NOTE — ED Triage Notes (Signed)
 Pt reports she has been dizzy with movements,  headache,.  right side ear pain, right sided neck stiffness x 1 day

## 2024-05-17 NOTE — Discharge Instructions (Addendum)
 Take medication as prescribed.  Do not drive, operate heavy equipment, or drink alcohol when taking the medication as it may cause drowsiness. Increase fluids and allow for plenty of rest. You may take over-the-counter Tylenol  as needed for pain, fever, or general discomfort. Avoid sudden movement.  When you wake up in the morning, sit on side of the bed before you begin to ambulate. Continue to monitor symptoms for worsening.  Go to the emergency department immediately if you experience loss of consciousness, change in your mental status, or other concerns. Recommend checking your blood pressures and keeping a log at home.  Check your blood pressures at random times throughout the day.  Take the blood pressure readings to your PCP for further evaluation. If symptoms fail to improve with this treatment, recommend follow-up with your PCP for further evaluation. Follow-up as needed.

## 2024-05-17 NOTE — ED Provider Notes (Signed)
 [facility]    CSN: [id number] Arrival date & time: 05/17/24  0901      History   Chief Complaint Chief Complaint  Patient presents with   Dizziness    HPI Ariel [name] is a 53 y.o. female.   The history is provided by the patient.   Patient presents for complaints of dizziness, headache, right ear fullness and pressure, and right-sided neck pain for the past day.  Patient denies fever, chills, lightheadedness, blurred vision, ear drainage, loss of hearing, nasal congestion, runny nose, cough, chest pain, or abdominal pain.  Patient states she has recently had a runny nose.  States that she did try Dramamine for her symptoms with good relief.  Also states that she tried to clean her ear at home.  Patient denies prior history of vertigo.  States that she is concerned that her blood pressure has been more elevated.    Past Medical History:  Diagnosis Date   ADD (attention deficit disorder)    Boil of buttock 09/12/2015   comes and goes- not at present   BV (bacterial vaginosis) 08/09/2013   03-05-16 no problems now   Heartburn    Hypertension    Obesity    Panic attack 07/13/2015   wakes up from sleep with panic attack   Pre-diabetes    Transfusion history    25 yrs ago after childbirth -NVD   Vaginal discharge 04/15/2016   Vaginal itching 08/09/2013   no problem now   Yeast infection 04/15/2016    Patient Active Problem List   Diagnosis Date Noted   Vaginal discharge 11/23/2023   Vaginal dryness 08/23/2023   Encounter for gynecological examination with Papanicolaou smear of cervix 06/28/2023   Encounter for screening fecal occult blood testing 06/28/2023   Anxiety 06/28/2023   Hypertension 06/28/2023   Irregular periods 06/28/2023   Diuretic-induced hypokalemia 05/13/2022   Hyperlipidemia associated with type 2 diabetes mellitus (HCC) 04/25/2021   Iron  deficiency anemia 12/13/2020   Menopausal symptoms 03/14/2020   Encounter for well woman exam  with routine gynecological exam 03/14/2020   Screening for colorectal cancer 03/14/2020   BMI 38.0-38.9,adult 03/14/2020   History of gastric surgery 05/10/2019   Class 2 obesity 06/25/2017   Peri-menopausal 09/12/2015   GERD (gastroesophageal reflux disease) 09/11/2015   Heel spur 04/17/2015   Right knee pain 01/15/2014   Diabetes mellitus (HCC) 07/12/2013   Essential hypertension, benign 05/30/2012    Past Surgical History:  Procedure Laterality Date   COLONOSCOPY WITH PROPOFOL  N/A 12/20/2023   Procedure: COLONOSCOPY WITH PROPOFOL ;  Surgeon: Shaaron Lamar HERO, MD;  Location: [facility];  Service: Endoscopy;  Laterality: N/A;  1230pm, asa 2   LAPAROSCOPIC GASTRIC SLEEVE RESECTION N/A 03/17/2016   Procedure: LAPAROSCOPIC GASTRIC SLEEVE RESECTION WITH UPPER ENDOSCOPY;  Surgeon: Alm Angle, MD;  Location: [facility];  Service: General;  Laterality: N/A;   TUBAL LIGATION     UPPER GI ENDOSCOPY  03/17/2016   Procedure: UPPER GI ENDOSCOPY;  Surgeon: Alm Angle, MD;  Location: [facility];  Service: General;;   WISDOM TOOTH EXTRACTION      OB History     Gravida  2   Para  2   Term      Preterm      AB      Living  2      SAB      IAB      Ectopic      Multiple  Live Births  2            Home Medications    Prior to Admission medications   Medication Sig Start Date End Date Taking? Authorizing Provider  meclizine  (ANTIVERT ) 12.5 MG tablet Take 1 tablet (12.5 mg total) by mouth 3 (three) times daily as needed for dizziness. 05/17/24  Yes Leath-Warren, Etta PARAS, NP  predniSONE  (DELTASONE ) 20 MG tablet Take 2 tablets (40 mg total) by mouth daily with breakfast for 5 days. 05/17/24 05/22/24 Yes Leath-Warren, Etta PARAS, NP  amLODipine  (NORVASC ) 10 MG tablet Take 1 tablet (10 mg total) by mouth daily. 08/23/23   Signa Delon LABOR, NP  escitalopram  (LEXAPRO ) 10 MG tablet Take 1 tablet (10 mg total) by mouth daily. 08/23/23 08/22/24  Signa Delon LABOR, NP  metFORMIN   (GLUCOPHAGE ) 500 MG tablet 1/2 po with lunch and dinner daily 04/20/24   Opalski, Barnie, DO  Vitamin D , Ergocalciferol , (DRISDOL ) 1.25 MG (50000 UNIT) CAPS capsule Take 1 capsule (50,000 Units total) by mouth every 7 (seven) days. 04/20/24   Midge Barnie, DO    Family History Family History  Problem Relation Age of Onset   Hypertension Mother    Obesity Father    Alcohol abuse Father    Kidney disease Father    Diabetes Father    Hypertension Father     Social History Social History   Tobacco Use   Smoking status: Never   Smokeless tobacco: Never  Vaping Use   Vaping status: Never Used  Substance Use Topics   Alcohol use: No    Comment: rare -occ   Drug use: Never     Allergies   Patient has no known allergies.   Review of Systems Review of Systems Per HPI  Physical Exam Triage Vital Signs ED Triage Vitals  Encounter Vitals Group     BP 05/17/24 0919 (!) 148/84     Girls Systolic BP Percentile --      Girls Diastolic BP Percentile --      Boys Systolic BP Percentile --      Boys Diastolic BP Percentile --      Pulse Rate 05/17/24 0919 77     Resp 05/17/24 0919 18     Temp 05/17/24 0919 97.7 F (36.5 C)     Temp Source 05/17/24 0919 Oral     SpO2 05/17/24 0919 98 %     Weight --      Height --      Head Circumference --      Peak Flow --      Pain Score 05/17/24 0920 0     Pain Loc --      Pain Education --      Exclude from Growth Chart --    Orthostatic VS for the past 24 hrs:  BP- Lying Pulse- Lying BP- Sitting Pulse- Sitting BP- Standing at 0 minutes Pulse- Standing at 0 minutes  05/17/24 0921 (!) 154/91 80 140/69 77 145/83 94    Updated Vital Signs BP (!) 148/84 (BP Location: Right Arm)   Pulse 77   Temp 97.7 F (36.5 C) (Oral)   Resp 18   SpO2 98%   Visual Acuity Right Eye Distance:   Left Eye Distance:   Bilateral Distance:    Right Eye Near:   Left Eye Near:    Bilateral Near:     Physical Exam Vitals and nursing note  reviewed.  Constitutional:      General: She is not  in acute distress.    Appearance: Normal appearance.  HENT:     Head: Normocephalic.     Right Ear: Ear canal and external ear normal.     Nose: Nose normal.     Mouth/Throat:     Mouth: Mucous membranes are moist.  Eyes:     Extraocular Movements: Extraocular movements intact.     Conjunctiva/sclera: Conjunctivae normal.     Pupils: Pupils are equal, round, and reactive to light.  Cardiovascular:     Rate and Rhythm: Normal rate and regular rhythm.     Pulses: Normal pulses.     Heart sounds: Normal heart sounds.  Pulmonary:     Effort: Pulmonary effort is normal. No respiratory distress.     Breath sounds: Normal breath sounds. No stridor. No wheezing, rhonchi or rales.  Abdominal:     General: Bowel sounds are normal.     Palpations: Abdomen is soft.     Tenderness: There is no abdominal tenderness.  Musculoskeletal:     Cervical back: Normal range of motion.  Skin:    General: Skin is warm.  Neurological:     General: No focal deficit present.     Mental Status: She is alert and oriented to person, place, and time.     GCS: GCS eye subscore is 4. GCS verbal subscore is 5. GCS motor subscore is 6.     Cranial Nerves: Cranial nerves 2-12 are intact.     Sensory: Sensation is intact.     Motor: Motor function is intact.     Coordination: Coordination is intact.     Gait: Gait is intact.  Psychiatric:        Mood and Affect: Mood normal.        Behavior: Behavior normal.      UC Treatments / Results  Labs (all labs ordered are listed, but only abnormal results are displayed) Labs Reviewed - No data to display  EKG   Radiology No results found.  Procedures Procedures (including critical care time)  Medications Ordered in UC Medications - No data to display  Initial Impression / Assessment and Plan / UC Course  I have reviewed the triage vital signs and the nursing notes.  Pertinent labs & imaging  results that were available during my care of the patient were reviewed by me and considered in my medical decision making (see chart for details).  Neurological exam is within normal limits.  On exam, patient does have a right middle ear effusion, consistent with right eustachian tube dysfunction.  This most likely is causing her to have vertigo symptoms.  Will treat with meclizine  12.5 mg and prednisone  40 mg to help with eustachian tube dysfunction.  Supportive care recommendations were provided discussed with the patient to include fluids, rest, taking over-the-counter allergy medication, using Flonase , and to avoid sudden movement.  Discussed indications with patient regarding follow-up.  Patient advised to begin monitoring her blood pressures throughout the day and to follow-up with her PCP for reevaluation.  Patient was in agreement with this plan of care and verbalized understanding.  All questions were answered.  Patient stable for discharge.  Work note was provided.  Final Clinical Impressions(s) / UC Diagnoses   Final diagnoses:  Dizziness  Acute dysfunction of right eustachian tube  Elevated blood pressure reading     Discharge Instructions      Take medication as prescribed.  Do not drive, operate heavy equipment, or drink alcohol when taking the medication  as it may cause drowsiness. Increase fluids and allow for plenty of rest. You may take over-the-counter Tylenol  as needed for pain, fever, or general discomfort. Avoid sudden movement.  When you wake up in the morning, sit on side of the bed before you begin to ambulate. Continue to monitor symptoms for worsening.  Go to the emergency department immediately if you experience loss of consciousness, change in your mental status, or other concerns. Recommend checking your blood pressures and keeping a log at home.  Check your blood pressures at random times throughout the day.  Take the blood pressure readings to your PCP for  further evaluation. If symptoms fail to improve with this treatment, recommend follow-up with your PCP for further evaluation. Follow-up as needed.     ED Prescriptions     Medication Sig Dispense Auth. Provider   predniSONE  (DELTASONE ) 20 MG tablet Take 2 tablets (40 mg total) by mouth daily with breakfast for 5 days. 10 tablet Leath-Warren, Etta PARAS, NP   meclizine  (ANTIVERT ) 12.5 MG tablet Take 1 tablet (12.5 mg total) by mouth 3 (three) times daily as needed for dizziness. 30 tablet Leath-Warren, Etta PARAS, NP      PDMP not reviewed this encounter.   Gilmer Etta PARAS, NP 05/17/24 [phone]

## 2024-06-05 ENCOUNTER — Ambulatory Visit (INDEPENDENT_AMBULATORY_CARE_PROVIDER_SITE_OTHER): Payer: Self-pay | Admitting: Family Medicine

## 2024-06-06 ENCOUNTER — Ambulatory Visit (INDEPENDENT_AMBULATORY_CARE_PROVIDER_SITE_OTHER): Admitting: Family Medicine

## 2024-06-06 ENCOUNTER — Encounter (INDEPENDENT_AMBULATORY_CARE_PROVIDER_SITE_OTHER): Payer: Self-pay | Admitting: Family Medicine

## 2024-06-06 VITALS — BP 146/83 | HR 77 | Temp 98.0°F | Ht 64.5 in | Wt 232.0 lb

## 2024-06-06 DIAGNOSIS — I152 Hypertension secondary to endocrine disorders: Secondary | ICD-10-CM

## 2024-06-06 DIAGNOSIS — E66812 Obesity, class 2: Secondary | ICD-10-CM

## 2024-06-06 DIAGNOSIS — Z6838 Body mass index (BMI) 38.0-38.9, adult: Secondary | ICD-10-CM

## 2024-06-06 DIAGNOSIS — H8110 Benign paroxysmal vertigo, unspecified ear: Secondary | ICD-10-CM | POA: Diagnosis not present

## 2024-06-06 DIAGNOSIS — E559 Vitamin D deficiency, unspecified: Secondary | ICD-10-CM

## 2024-06-06 DIAGNOSIS — Z6839 Body mass index (BMI) 39.0-39.9, adult: Secondary | ICD-10-CM

## 2024-06-06 DIAGNOSIS — E1169 Type 2 diabetes mellitus with other specified complication: Secondary | ICD-10-CM

## 2024-06-06 DIAGNOSIS — E1159 Type 2 diabetes mellitus with other circulatory complications: Secondary | ICD-10-CM | POA: Diagnosis not present

## 2024-06-06 DIAGNOSIS — H6990 Unspecified Eustachian tube disorder, unspecified ear: Secondary | ICD-10-CM

## 2024-06-06 DIAGNOSIS — Z7984 Long term (current) use of oral hypoglycemic drugs: Secondary | ICD-10-CM

## 2024-06-06 MED ORDER — METFORMIN HCL 500 MG PO TABS: ORAL_TABLET | ORAL | 1 refills | Status: DC

## 2024-06-06 MED ORDER — FLUTICASONE PROPIONATE 50 MCG/ACT NA SUSP: NASAL | 2 refills | Status: AC

## 2024-06-06 MED ORDER — OLMESARTAN MEDOXOMIL-HCTZ 20-12.5 MG PO TABS: 1.0000 | ORAL_TABLET | Freq: Every day | ORAL | 0 refills | Status: DC

## 2024-06-06 MED ORDER — VITAMIN D (ERGOCALCIFEROL) 1.25 MG (50000 UNIT) PO CAPS
50000.0000 [IU] | ORAL_CAPSULE | ORAL | 1 refills | Status: DC
Start: 2024-06-06 — End: 2024-08-09

## 2024-06-06 NOTE — Progress Notes (Signed)
 Ariel [name], D.O.  ABFM, ABOM Specializing in Clinical Bariatric Medicine  Office located at: [address] [place], [place]  [address]   Assessment and Plan:   Medications Discontinued During This Encounter  Medication Reason   Vitamin D , Ergocalciferol , (DRISDOL ) 1.25 MG (50000 UNIT) CAPS capsule Reorder   metFORMIN  (GLUCOPHAGE ) 500 MG tablet Reorder     Meds ordered this encounter  Medications   fluticasone  (FLONASE ) 50 MCG/ACT nasal spray    Sig: 1 spray each nostril following sinus rinses twice daily    Dispense:  16 g    Refill:  2   olmesartan -hydrochlorothiazide  (BENICAR  HCT) 20-12.5 MG tablet    Sig: Take 1 tablet by mouth daily.    Dispense:  90 tablet    Refill:  0   metFORMIN  (GLUCOPHAGE ) 500 MG tablet    Sig: 1/2 po with lunch and dinner daily    Dispense:  30 tablet    Refill:  1    30 d supply;  ** OV for RF **   Do not send RF request   Vitamin D , Ergocalciferol , (DRISDOL ) 1.25 MG (50000 UNIT) CAPS capsule    Sig: Take 1 capsule (50,000 Units total) by mouth every 7 (seven) days.    Dispense:  5 capsule    Refill:  1     Recheck fasting labs next OV to include Vit D, HgbA1c, BMP, etc.   FOR THE DISEASE OF OBESITY:  BMI 38.0-38.9,adult current 39.22 Class 2 obesity - START BMI 38.04 (02/29/2024) Assessment & Plan: Since last office visit on 05/15/2024 patient's muscle mass has stayed the same. Fat mass has increased by 1.4 lbs. Total body water has increased by 0.8 lbs.  Body fat % has increased by 0.3 %. Counseling done on how various foods will affect these numbers and how to maximize success  Total lbs lost to date: + 7 lbs Total weight loss percentage to date: + 3.11 %   Recommended Dietary Goals Ariel [name] is currently in the action stage of change. As such, her goal is to continue weight management plan.  She has agreed to: continue current plan   Behavioral Intervention We discussed the following today: work on tracking and journaling  calories using tracking application and continue to work on implementation of reduced calorie nutritional plan  Additional resources provided today: Handouts on BPVV exercises.   Evidence-based interventions for health behavior change were utilized today including the discussion of self monitoring techniques, problem-solving barriers and SMART goal setting techniques.   Regarding patient's less desirable eating habits and patterns, we employed the technique of small changes.   Goal: journal intake   Recommended Physical Activity Goals Ariel [name] has been advised to work up to 300-450 minutes of moderate intensity aerobic activity a week and strengthening exercises 2-3 times per week for cardiovascular health, weight loss maintenance and preservation of muscle mass.   She was instructed to start BPVV exercises (see note below).   Pharmacotherapy See T2DM note.   ASSOCIATED CONDITIONS ADDRESSED TODAY:   Benign paroxysmal positional vertigo, unspecified laterality Dysfunction of Eustachian tube, unspecified laterality Assessment & Plan: She has been experiencing vertigo for the past couple weeks. She met with her PCP and was told the vertigo was secondary to eustachian tube dysfunction. She was prescribed meclizine  12.5 mg 3 times daily PRN which has been helping somewhat. Provided her handouts on BPVV exercises to be done 2-3x daily. She will continue meclizine  and was advised to start using Flonase   nasal spray following sinus rinses twice daily. Will monitor how she is doing at next OV.     Hypertension associated with diabetes [facility]) Assessment & Plan: BP Readings from Last 3 Encounters:  06/06/24 (!) 146/83  05/17/24 (!) 148/84  05/15/24 (!) 151/82   The 10-year ASCVD risk score (Arnett DK, et al., 2019) is: 21.2%  Lab Results  Component Value Date   CREATININE 0.74 02/29/2024   Pt was previously on hydrochlorothiazide  back in 2022 but eventually transitioned to amlodipine  10 mg  daily. BP today above goal and has been above goal in several of her recent doctor visits. Pt asx. Continue amlodipine  and start benicar  20-12.5 mg  0.5 tablet daily to goal BP 130/80 or less on a regular basis; increase to 1 tablet after 5 days if BP not at goal. Bring BP log to next OV. Continue low sodium diet, increase exercise as able. Follow up with PCP regarding new medication.     Type 2 diabetes mellitus with obesity [facility]) Assessment & Plan: Lab Results  Component Value Date   HGBA1C 5.9 (H) 02/29/2024   HGBA1C 5.8 05/13/2022   HGBA1C 5.8 01/08/2022   INSULIN  11.4 02/29/2024    HgbA1c is at goal for age and comorbid conditions. Denies symptoms of hypoglycemia or hyperglycemia. On Metformin  500 mg 0.5 tablet at lunch and 0.5 tablet at dinner with reported good compliance and tolerance; will refill today. Hunger and cravings are stable. She will continue to decrease simple carbs/ sugars; increase fiber and proteins. Will strongly consider starting Mounjaro at next OV as its superior to the Ozempic  that she was on in the past. Recheck labs next OV.     Hypovitaminosis D Assessment & Plan: Lab Results  Component Value Date   VD25OH 28.2 (L) 02/29/2024   VD25OH 35 11/01/2020   VD25OH 30 05/10/2019   Vit D level is not at goal of 50 to 70. Cont ergocalciferol  50,000 units weekly; refill today. Cont wt loss efforts.     Follow up:   Return 07/10/2024 at 9:40 AM.  She was informed of the importance of frequent follow up visits to maximize her success with intensive lifestyle modifications for her multiple health conditions.   Subjective:   Chief complaint: Obesity Ariel [name] is here to discuss her progress with her obesity treatment plan. She is journaling 1100 calories and 80++ grams protein daily with the CAT 1 MP as a guide and states she is following her eating plan approximately 25% of the time. She states she is not exercising due to vertigo.    Interval History:  Ariel [name] is here for a follow up office visit. Since last OV on 05/15/2024 , she is up 1 lb. Problems with vertigo and blood pressure have made it difficult for her to focus on healthy eating. We will address these issues today.   Pharmacotherapy that aid with weight loss: She is currently taking Metformin  500 mg 0.5 tablet at lunch and 0.5 tablet at dinner.     Review of Systems:  Pertinent positives were addressed with patient today.  Reviewed by clinician on day of visit: allergies, medications, problem list, medical history, surgical history, family history, social history, and previous encounter notes.  Weight Summary and Biometrics   Weight Lost Since Last Visit: 0lb  Weight Gained Since Last Visit: 1lb   Vitals Temp: 98 F (36.7 C) BP: (!) 146/83 Pulse Rate: 77 SpO2: 96 %   Anthropometric Measurements Height: 5' 4.5 (1.638 m)  Weight: 232 lb (105.2 kg) BMI (Calculated): 39.22 Weight at Last Visit: 231lb Weight Lost Since Last Visit: 0lb Weight Gained Since Last Visit: 1lb Starting Weight: 225lb Total Weight Loss (lbs): 0 lb (0 kg) Peak Weight: 259lb   Body Composition  Body Fat %: 46.7 % Fat Mass (lbs): 108.6 lbs Muscle Mass (lbs): 117.6 lbs Total Body Water (lbs): 86.4 lbs Visceral Fat Rating : 14   Other Clinical Data Fasting: no Labs: no Today's Visit #: 5 Starting Date: 02/29/24    Objective:   PHYSICAL EXAM: Blood pressure (!) 146/83, pulse 77, temperature 98 F (36.7 C), height 5' 4.5 (1.638 m), weight 232 lb (105.2 kg), SpO2 96%. Body mass index is 39.21 kg/m.  General: she is overweight, cooperative and in no acute distress. PSYCH: Has normal mood, affect and thought process.   HEENT: EOMI, sclerae are anicteric. Lungs: Normal breathing effort, no conversational dyspnea. Extremities: Moves * 4 Neurologic: A and O * 3, good insight  DIAGNOSTIC DATA REVIEWED: BMET    Component Value Date/Time   NA 139 02/29/2024 1052   K 3.9  02/29/2024 1052   CL 103 02/29/2024 1052   CO2 22 02/29/2024 1052   GLUCOSE 104 (H) 02/29/2024 1052   GLUCOSE 86 05/13/2022 0841   BUN 9 02/29/2024 1052   CREATININE 0.74 02/29/2024 1052   CREATININE 0.80 04/24/2021 0831   CALCIUM  9.2 02/29/2024 1052   GFRNONAA 94 02/13/2021 1050   GFRAA 109 02/13/2021 1050   Lab Results  Component Value Date   HGBA1C 5.9 (H) 02/29/2024   HGBA1C 6.1 (H) 05/27/2012   Lab Results  Component Value Date   INSULIN  11.4 02/29/2024   Lab Results  Component Value Date   TSH 1.300 02/29/2024   CBC    Component Value Date/Time   WBC 4.0 02/29/2024 1052   WBC 4.9 01/08/2022 1001   RBC 4.76 02/29/2024 1052   RBC 4.63 01/08/2022 1001   HGB 12.6 02/29/2024 1052   HCT 40.4 02/29/2024 1052   PLT 266 02/29/2024 1052   MCV 85 02/29/2024 1052   MCH 26.5 (L) 02/29/2024 1052   MCH 26.5 (L) 04/24/2021 0831   MCHC 31.2 (L) 02/29/2024 1052   MCHC 33.5 01/08/2022 1001   RDW 13.2 02/29/2024 1052   Iron  Studies    Component Value Date/Time   IRON  45 11/01/2020 1203   TIBC 336 11/01/2020 1203   FERRITIN 30 11/01/2020 1203   IRONPCTSAT 13 (L) 11/01/2020 1203   Lipid Panel     Component Value Date/Time   CHOL 234 (H) 02/29/2024 1052   TRIG 124 02/29/2024 1052   HDL 49 02/29/2024 1052   CHOLHDL 4.8 (H) 02/29/2024 1052   CHOLHDL 4 05/13/2022 0841   VLDL 18.4 05/13/2022 0841   LDLCALC 163 (H) 02/29/2024 1052   LDLCALC 134 (H) 04/24/2021 0831   Hepatic Function Panel     Component Value Date/Time   PROT 7.7 02/29/2024 1052   ALBUMIN 4.5 02/29/2024 1052   AST 20 02/29/2024 1052   ALT 16 02/29/2024 1052   ALKPHOS 99 02/29/2024 1052   BILITOT 0.5 02/29/2024 1052      Component Value Date/Time   TSH 1.300 02/29/2024 1052   Nutritional Lab Results  Component Value Date   VD25OH 28.2 (L) 02/29/2024   VD25OH 35 11/01/2020   VD25OH 30 05/10/2019    Attestations:   I, Special Puri, acting as a Stage manager for Ariel Jenkins, DO., have  compiled all relevant documentation for today's  office visit on behalf of Ariel Jenkins, DO, while in the presence of Ariel Jenkins, DO.  I have reviewed the above documentation for accuracy and completeness, and I agree with the above. Ariel JINNY [name], D.O.  The 21st Century Cures Act was signed into law in 2016 which includes the topic of electronic health records.  This provides immediate access to information in MyChart.  This includes consultation notes, operative notes, office notes, lab results and pathology reports.  If you have any questions about what you read please let us  know at your next visit so we can discuss your concerns and take corrective action if need be.  We are right here with you.

## 2024-06-06 NOTE — Patient Instructions (Signed)
 Canalith Repositioning Procedure Vertigo is often caused by the displacement of small calcium  carbonate crystals, or canaliths, within your inner ear. The Epley maneuver -- also known as the canalith repositioning procedure (CRP) -- is a method to remove these crystals trapped in your ear's semicircular canal.  What is a canalith repositioning procedure? A canalith repositioning procedure (CRP) is a treatment for benign paroxysmal positional vertigo (BPPV), the most common cause of vertigo. The most well-known and performed CRP is the called the Epley maneuver. It involves a series of head movements that aim to relieve vertigo symptoms.  With BPPV, tiny calcium  carbonate crystals, called otoconia (also known as canaliths), detach from the utricle in your inner ear and move into the semicircular canals instead. When this happens, changes in your head position can trigger episodes of vertigo.  The Epley maneuver focuses on moving the canaliths out of your semicircular canals. It helps reduce BPPV symptoms, but it doesn't work on other types of vertigo.  Treatment Details Step-by-step instructions for the Epley maneuver. The Epley maneuver treats BPPV, a type of vertigo. It moves displaced calcium  crystals out of your semicircular canals. How is the Epley maneuver done? Your healthcare provider will:  Tilt your head to whichever side is causing vertigo. Have you quickly lie flat on your back with your head slightly off the edge of the examination table in the same position. (Your vertigo symptoms may worsen during this portion of the procedure.) Gradually move your head to the opposite side. Rotate the rest of your body so it's in alignment with your head. Ask you to remain on your side for a few moments. Sit you upright. It's important to keep your head down and not lift it up during any portion of the CRP. The canalith repositioning procedure takes about five to 10 minutes to complete.  Can I  do the Epley maneuver myself? Yes. You can perform the Epley maneuver at home on yourself. Sit on the edge of your bed and follow the steps outlined in the section above. For best results, you may need to perform the maneuver three times.  It's a good idea to ask your healthcare provider to demonstrate the Epley maneuver before you try it at home to ensure you're doing it properly. You should also ask them to confirm which ear is causing BPPV symptoms.  When do you do the Epley maneuver? While you can do the Epley maneuver anytime, many people prefer to do it just before they go to sleep. That way, if you have lingering vertigo symptoms after performing the procedure, you can sleep through them.  You should consult a healthcare provider before incorporating any new therapy into your routine.  How long does the Epley maneuver take to work? Most people report relief from BPPV symptoms immediately following a canalith repositioning procedure. However, sometimes, you might have to repeat maneuvers in order to reduce your symptoms.

## 2024-06-13 ENCOUNTER — Ambulatory Visit: Admitting: Family Medicine

## 2024-06-29 ENCOUNTER — Encounter: Payer: Self-pay | Admitting: Family Medicine

## 2024-06-29 ENCOUNTER — Ambulatory Visit: Admitting: Family Medicine

## 2024-06-29 VITALS — BP 122/75 | HR 90 | Temp 98.3°F | Ht 64.5 in | Wt 232.4 lb

## 2024-06-29 DIAGNOSIS — Z0001 Encounter for general adult medical examination with abnormal findings: Secondary | ICD-10-CM

## 2024-06-29 DIAGNOSIS — I10 Essential (primary) hypertension: Secondary | ICD-10-CM

## 2024-06-29 DIAGNOSIS — Z1231 Encounter for screening mammogram for malignant neoplasm of breast: Secondary | ICD-10-CM

## 2024-06-29 DIAGNOSIS — E785 Hyperlipidemia, unspecified: Secondary | ICD-10-CM

## 2024-06-29 DIAGNOSIS — Z0000 Encounter for general adult medical examination without abnormal findings: Secondary | ICD-10-CM | POA: Insufficient documentation

## 2024-06-29 DIAGNOSIS — Z6839 Body mass index (BMI) 39.0-39.9, adult: Secondary | ICD-10-CM

## 2024-06-29 DIAGNOSIS — E1169 Type 2 diabetes mellitus with other specified complication: Secondary | ICD-10-CM | POA: Diagnosis not present

## 2024-06-29 DIAGNOSIS — F419 Anxiety disorder, unspecified: Secondary | ICD-10-CM

## 2024-06-29 DIAGNOSIS — K219 Gastro-esophageal reflux disease without esophagitis: Secondary | ICD-10-CM | POA: Diagnosis not present

## 2024-06-29 NOTE — Assessment & Plan Note (Addendum)
 Well controlled on olmesartan -hydrochlorothiazide  20-12.5mg  daily.  Recommend heart healthy diet such as Mediterranean diet with whole grains, fruits, vegetable, fish, lean meats, nuts, and olive oil. Limit salt. Encouraged moderate walking, 3-5 times/week for 30-50 minutes each session. Aim for at least 150 minutes.week. Goal should be pace of 3 miles/hours, or walking 1.5 miles in 30 minutes. Avoid tobacco products. Avoid excess alcohol. Take medications as prescribed and bring medications and blood pressure log with cuff to each office visit. Seek medical care for chest pain, palpitations, shortness of breath with exertion, dizziness/lightheadedness, vision changes, recurrent headaches, or swelling of extremities. Follow up in 6 months or sooner if needed. Fasting labs ordered

## 2024-06-29 NOTE — Assessment & Plan Note (Signed)
 Well controlled. Not medicated currently. GAD 2.

## 2024-06-29 NOTE — Progress Notes (Signed)
 New Patient Office Visit  Subjective    Patient ID: Ariel [name], female    DOB: 1970-12-19  Age: 53 y.o. MRN: [id number]  CC:  Chief Complaint  Patient presents with   Establish Care    HPI DASHIA CALDEIRA presents to establish care. Oriented to practice routines and expectations. PMH includes Obesity, DM2, GERD, HTN, anxiety and ADD. Does see [facility], OBGYN, and GI. PSH include gastric sleeve. No concerns today.   DM2: controlled since gastric sleeve surgery, last A1c 5.9, on metformin  250mg  BID for 1 month  HTN: controlled on olmesartan -hydrochlorothiazide  20-12.5mg  daily  HLD: not medicated, ASCVD 11%, will recheck today  Anxiety: well controlled, not taking Lexapro  consistently  Vitamin D  Insufficiency: supplementing 50,000 units weekly     06/29/2024   11:46 AM 11/23/2023    9:03 AM 08/23/2023    8:53 AM 06/28/2023   11:33 AM  GAD 7 : Generalized Anxiety Score  Nervous, Anxious, on Edge 1 0 1 1  Control/stop worrying 1 1 0 1  Worry too much - different things 0 1 0 1  Trouble relaxing 0 1 0 1  Restless 0 0 0 1  Easily annoyed or irritable 0 0 0 1  Afraid - awful might happen 0 0 0 0  Total GAD 7 Score 2 3 1 6   Anxiety Difficulty Not difficult at all          06/29/2024   11:46 AM 02/29/2024    9:36 AM 11/23/2023    9:01 AM 08/23/2023    8:51 AM 06/28/2023   11:33 AM  Depression screen PHQ 2/9  Decreased Interest 0 1  0 0  Down, Depressed, Hopeless 0 0 0 0 0  PHQ - 2 Score 0 1 0 0 0  Altered sleeping 2 1 1 1 3   Tired, decreased energy 1 1 1  0 1  Change in appetite 0 1 1 1  0  Feeling bad or failure about yourself  0 0 0 0 0  Trouble concentrating 0 1 0 0 1  Moving slowly or fidgety/restless 0 0 0 0 1  Suicidal thoughts 0 0 0 0 0  PHQ-9 Score 3 5 3 2 6   Difficult doing work/chores Not difficult at all Not difficult at all        Health Maintenance  Topic Date Due   OPHTHALMOLOGY EXAM  Never done   Diabetic kidney evaluation - Urine ACR  11/01/2021    DTaP/Tdap/Td (5 - Td or Tdap) 09/30/2022   Mammogram  06/30/2024   COVID-19 Vaccine (4 - 2025-26 season) 07/15/2024 (Originally 06/12/2024)   Zoster Vaccines- Shingrix  (1 of 2) 09/28/2024 (Originally 10/16/2019)   Pneumococcal Vaccine: 50+ Years (1 of 2 - PCV) 06/29/2025 (Originally 10/15/1988)   Hepatitis B Vaccines 19-59 Average Risk (1 of 3 - 19+ 3-dose series) 06/29/2025 (Originally 10/15/1988)   HIV Screening  06/29/2025 (Originally 10/15/1984)   HEMOGLOBIN A1C  08/31/2024   Diabetic kidney evaluation - eGFR measurement  02/28/2025   FOOT EXAM  06/29/2025   Cervical Cancer Screening (HPV/Pap Cotest)  06/27/2028   Colonoscopy  12/19/2033   Influenza Vaccine  Completed   Hepatitis C Screening  Completed   HPV VACCINES  Aged Out   Meningococcal B Vaccine  Aged Out       Outpatient Encounter Medications as of 06/29/2024  Medication Sig   escitalopram  (LEXAPRO ) 10 MG tablet Take 1 tablet (10 mg total) by mouth daily.   fluticasone  (FLONASE ) 50 MCG/ACT nasal  spray 1 spray each nostril following sinus rinses twice daily   metFORMIN  (GLUCOPHAGE ) 500 MG tablet 1/2 po with lunch and dinner daily   olmesartan -hydrochlorothiazide  (BENICAR  HCT) 20-12.5 MG tablet Take 1 tablet by mouth daily.   Vitamin D , Ergocalciferol , (DRISDOL ) 1.25 MG (50000 UNIT) CAPS capsule Take 1 capsule (50,000 Units total) by mouth every 7 (seven) days.   [DISCONTINUED] amLODipine  (NORVASC ) 10 MG tablet Take 1 tablet (10 mg total) by mouth daily.   [DISCONTINUED] meclizine  (ANTIVERT ) 12.5 MG tablet Take 1 tablet (12.5 mg total) by mouth 3 (three) times daily as needed for dizziness.   No facility-administered encounter medications on file as of 06/29/2024.    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   Vaginal discharge 04/15/2016   Vaginal itching 08/09/2013   no problem now   Yeast infection 04/15/2016    Past Surgical History:  Procedure Laterality Date   COLONOSCOPY WITH PROPOFOL  N/A 12/20/2023   Procedure: COLONOSCOPY WITH PROPOFOL ;  Surgeon: Shaaron Lamar HERO, MD;  Location: [facility];  Service: Endoscopy;  Laterality: N/A;  1230pm, asa 2   LAPAROSCOPIC GASTRIC SLEEVE RESECTION N/A 03/17/2016   Procedure: LAPAROSCOPIC GASTRIC SLEEVE RESECTION WITH UPPER ENDOSCOPY;  Surgeon: Alm Angle, MD;  Location: [facility];  Service: General;  Laterality: N/A;   TUBAL LIGATION     UPPER GI ENDOSCOPY  03/17/2016   Procedure: UPPER GI ENDOSCOPY;  Surgeon: Alm Angle, MD;  Location: [facility];  Service: General;;   WISDOM TOOTH EXTRACTION      Family History  Problem Relation Age of Onset   Hypertension Mother    Obesity Father    Alcohol abuse Father    Kidney disease Father    Diabetes Father    Hypertension Father    Breast cancer Sister    Diabetes Brother    ADD / ADHD Son     Social History   Socioeconomic History   Marital status: Single    Spouse name: Not on file   Number of children: 2   Years of education: Not on file   Highest education level: Not on file  Occupational History   Not on file  Tobacco Use   Smoking status: Never   Smokeless tobacco: Never  Vaping Use   Vaping status: Never Used  Substance and Sexual Activity   Alcohol use: No    Comment: rare -occ   Drug use: Never   Sexual activity: Yes    Birth control/protection: Surgical    Comment: tubal  Other Topics Concern   Not on file  Social History Narrative   Not on file   Social Drivers of Health   Financial Resource Strain: Low Risk  (06/28/2023)   Overall Financial  Resource Strain (CARDIA)    Difficulty of Paying Living Expenses: Not very hard  Food Insecurity: No Food Insecurity (06/28/2023)   Hunger Vital Sign    Worried About Running Out of Food in the Last Year: Never  true    Ran Out of Food in the Last Year: Never true  Transportation Needs: No Transportation Needs (06/28/2023)   PRAPARE - Administrator, Civil Service (Medical): No    Lack of Transportation (Non-Medical): No  Physical Activity: Insufficiently Active (06/28/2023)   Exercise Vital Sign    Days of Exercise per Week: 2 days    Minutes of Exercise per Session: 30 min  Stress: Stress Concern Present (06/28/2023)   [employer] of Occupational Health - Occupational Stress Questionnaire    Feeling of Stress : To some extent  Social Connections: Moderately Isolated (06/28/2023)   Social Connection and Isolation Panel    Frequency of Communication with Friends and Family: Three times a week    Frequency of Social Gatherings with Friends and Family: Once a week    Attends Religious Services: More than 4 times per year    Active Member of [employer] or Organizations: No    Attends Banker Meetings: Never    Marital Status: Never married  Intimate Partner Violence: Not At Risk (06/28/2023)   Humiliation, Afraid, Rape, and Kick questionnaire    Fear of Current or Ex-Partner: No    Emotionally Abused: No    Physically Abused: No    Sexually Abused: No    Review of Systems  Constitutional: Negative.   HENT: Negative.    Eyes: Negative.   Respiratory: Negative.    Cardiovascular: Negative.   Gastrointestinal: Negative.   Genitourinary: Negative.   Musculoskeletal: Negative.   Skin: Negative.   Neurological: Negative.   Endo/Heme/Allergies: Negative.   Psychiatric/Behavioral: Negative.    All other systems reviewed and are negative.       Objective    BP 122/75   Pulse 90   Temp 98.3 F (36.8 C)   Ht 5' 4.5 (1.638 m)   Wt 232 lb 6.4 oz (105.4  kg)   LMP 04/11/2024 (Approximate)   SpO2 98%   BMI 39.28 kg/m   Physical Exam Vitals and nursing note reviewed.  Constitutional:      Appearance: Normal appearance. She is obese.  HENT:     Head: Normocephalic and atraumatic.     Right Ear: Tympanic membrane, ear canal and external ear normal.     Left Ear: Tympanic membrane, ear canal and external ear normal.     Nose: Nose normal.     Mouth/Throat:     Mouth: Mucous membranes are moist.     Pharynx: Oropharynx is clear.  Eyes:     Extraocular Movements: Extraocular movements intact.     Conjunctiva/sclera: Conjunctivae normal.     Pupils: Pupils are equal, round, and reactive to light.  Cardiovascular:     Rate and Rhythm: Normal rate and regular rhythm.     Pulses: Normal pulses.     Heart sounds: Normal heart sounds.  Pulmonary:     Effort: Pulmonary effort is normal.     Breath sounds: Normal breath sounds.  Abdominal:     General: Bowel sounds are normal.     Palpations: Abdomen is soft.  Musculoskeletal:        General: Normal range of motion.     Cervical back: Normal range of motion and neck supple.  Skin:    General: Skin is warm and dry.     Capillary Refill: Capillary refill takes less than 2 seconds.  Neurological:     General: No focal deficit present.     Mental Status: She is  alert and oriented to person, place, and time. Mental status is at baseline.  Psychiatric:        Mood and Affect: Mood normal.        Behavior: Behavior normal.        Thought Content: Thought content normal.        Judgment: Judgment normal.    Diabetic Foot Exam - Simple   Simple Foot Form Visual Inspection No deformities, no ulcerations, no other skin breakdown bilaterally: Yes Sensation Testing Intact to touch and monofilament testing bilaterally: Yes Pulse Check Posterior Tibialis and Dorsalis pulse intact bilaterally: Yes Comments          Assessment & Plan:   Problem List Items Addressed This Visit      Essential hypertension, benign   Well controlled on olmesartan -hydrochlorothiazide  20-12.5mg  daily.  Recommend heart healthy diet such as Mediterranean diet with whole grains, fruits, vegetable, fish, lean meats, nuts, and olive oil. Limit salt. Encouraged moderate walking, 3-5 times/week for 30-50 minutes each session. Aim for at least 150 minutes.week. Goal should be pace of 3 miles/hours, or walking 1.5 miles in 30 minutes. Avoid tobacco products. Avoid excess alcohol. Take medications as prescribed and bring medications and blood pressure log with cuff to each office visit. Seek medical care for chest pain, palpitations, shortness of breath with exertion, dizziness/lightheadedness, vision changes, recurrent headaches, or swelling of extremities. Follow up in 6 months or sooner if needed. Fasting labs ordered      Relevant Orders   Comprehensive metabolic panel with GFR   CBC with Differential/Platelet   Lipid panel   GERD (gastroesophageal reflux disease)   Elevated HOB if needed and avoid lying down 2-3 hours after eating, avoid coffee, alcohol, chocolate, fatty foods, citrus, carbonated beverages, spicy foods, late meals, and smoking. Return to office if symptoms return or worsen and seek medical care for difficulty swallowing, bleeding, anemia, weight loss, or recurrent vomiting.  Follow up in 6 months or sooner if needed. Fasting labs ordered      Type 2 diabetes mellitus with obesity (HCC)   A1c and uACR ordered. Foot exam today. Vaccines UTD. Retinal eye exam UTD. Recommend heart healthy diet such as Mediterranean diet with whole grains, fruits, vegetable, fish, lean meats, nuts, and olive oil. Limit salt. Encouraged moderate walking, 3-5 times/week for 30-50 minutes each session. Aim for at least 150 minutes.week. Goal should be pace of 3 miles/hours, or walking 1.5 miles in 30 minutes. Seek medical care for urinary frequency, extreme thirst, vision changes, lightheadedness, dizziness.   Follow up in 6 months or sooner if needed. Fasting labs ordered      Relevant Orders   Hemoglobin A1c   Microalbumin / creatinine urine ratio   Vitamin B12   Anxiety   Well controlled. Not medicated currently. GAD 2.      Physical exam, annual - Primary   Today your medical history was reviewed and routine physical exam with labs was performed. Recommend 150 minutes of moderate intensity exercise weekly and consuming a well-balanced diet. Advised to stop smoking if a smoker, avoid smoking if a non-smoker, limit alcohol consumption to 1 drink per day for women and 2 drinks per day for men, and avoid illicit drug use. Counseled on safe sex practices and offered STI testing today. Counseled on the importance of sunscreen use. Counseled in mental health awareness and when to seek medical care. Vaccine maintenance discussed. Appropriate health maintenance items reviewed. Return to office in 1 year for annual physical  exam.       Relevant Orders   Comprehensive metabolic panel with GFR   CBC with Differential/Platelet   Lipid panel   VITAMIN D  25 Hydroxy (Vit-D Deficiency, Fractures)   Hemoglobin A1c   Microalbumin / creatinine urine ratio   Vitamin B12   Morbid obesity (HCC)   Counseled on importance of weight management for overall health. Encouraged low calorie, heart healthy diet and moderate intensity exercise 150 minutes weekly. This is 3-5 times weekly for 30-50 minutes each session. Goal should be pace of 3 miles/hours, or walking 1.5 miles in 30 minutes and include strength training. Discussed risks of obesity. Followed by [facility]      Relevant Orders   Comprehensive metabolic panel with GFR   CBC with Differential/Platelet   Lipid panel   VITAMIN D  25 Hydroxy (Vit-D Deficiency, Fractures)   Hyperlipidemia associated with type 2 diabetes mellitus (HCC)   Fasting labs ordered, likely needing statin. I recommend consuming a heart healthy diet such as Mediterranean diet or DASH diet  with whole grains, fruits, vegetable, fish, lean meats, nuts, and olive oil. Limit sweets and processed foods. I also encourage moderate intensity exercise 150 minutes weekly. This is 3-5 times weekly for 30-50 minutes each session. Goal should be pace of 3 miles/hours, or walking 1.5 miles in 30 minutes. The 10-year ASCVD risk score (Arnett DK, et al., 2019) is: 11.7%       Relevant Orders   Lipid panel   Other Visit Diagnoses       Encounter for screening mammogram for malignant neoplasm of breast       Relevant Orders   MM DIGITAL SCREENING BILATERAL       Return in about 6 months (around 12/27/2024) for chronic follow-up with labs 1 week prior.   Jeoffrey GORMAN Barrio, FNP

## 2024-06-29 NOTE — Assessment & Plan Note (Signed)
 A1c and uACR ordered. Foot exam today. Vaccines UTD. Retinal eye exam UTD. Recommend heart healthy diet such as Mediterranean diet with whole grains, fruits, vegetable, fish, lean meats, nuts, and olive oil. Limit salt. Encouraged moderate walking, 3-5 times/week for 30-50 minutes each session. Aim for at least 150 minutes.week. Goal should be pace of 3 miles/hours, or walking 1.5 miles in 30 minutes. Seek medical care for urinary frequency, extreme thirst, vision changes, lightheadedness, dizziness.  Follow up in 6 months or sooner if needed. Fasting labs ordered

## 2024-06-29 NOTE — Assessment & Plan Note (Signed)
 Elevated HOB if needed and avoid lying down 2-3 hours after eating, avoid coffee, alcohol, chocolate, fatty foods, citrus, carbonated beverages, spicy foods, late meals, and smoking. Return to office if symptoms return or worsen and seek medical care for difficulty swallowing, bleeding, anemia, weight loss, or recurrent vomiting.  Follow up in 6 months or sooner if needed. Fasting labs ordered

## 2024-06-29 NOTE — Assessment & Plan Note (Signed)

## 2024-06-29 NOTE — Patient Instructions (Signed)
 It was great to meet you today and I'm excited to have you join the [facility] practice. I hope you had a positive experience today! If you feel so inclined, please feel free to recommend our practice to friends and family. Jeoffrey Barrio, FNP-C

## 2024-06-29 NOTE — Assessment & Plan Note (Signed)
 Fasting labs ordered, likely needing statin. I recommend consuming a heart healthy diet such as Mediterranean diet or DASH diet with whole grains, fruits, vegetable, fish, lean meats, nuts, and olive oil. Limit sweets and processed foods. I also encourage moderate intensity exercise 150 minutes weekly. This is 3-5 times weekly for 30-50 minutes each session. Goal should be pace of 3 miles/hours, or walking 1.5 miles in 30 minutes. The 10-year ASCVD risk score (Arnett DK, et al., 2019) is: 11.7%

## 2024-06-29 NOTE — Assessment & Plan Note (Signed)
 Counseled on importance of weight management for overall health. Encouraged low calorie, heart healthy diet and moderate intensity exercise 150 minutes weekly. This is 3-5 times weekly for 30-50 minutes each session. Goal should be pace of 3 miles/hours, or walking 1.5 miles in 30 minutes and include strength training. Discussed risks of obesity. Followed by [facility]

## 2024-06-30 ENCOUNTER — Other Ambulatory Visit

## 2024-06-30 DIAGNOSIS — Z0000 Encounter for general adult medical examination without abnormal findings: Secondary | ICD-10-CM

## 2024-06-30 DIAGNOSIS — E785 Hyperlipidemia, unspecified: Secondary | ICD-10-CM

## 2024-06-30 DIAGNOSIS — E1169 Type 2 diabetes mellitus with other specified complication: Secondary | ICD-10-CM

## 2024-06-30 DIAGNOSIS — I10 Essential (primary) hypertension: Secondary | ICD-10-CM

## 2024-07-03 ENCOUNTER — Ambulatory Visit: Payer: Self-pay | Admitting: Family Medicine

## 2024-07-04 ENCOUNTER — Encounter: Payer: Self-pay | Admitting: Family Medicine

## 2024-07-04 ENCOUNTER — Ambulatory Visit: Admitting: Family Medicine

## 2024-07-04 VITALS — BP 140/84 | HR 84 | Temp 97.7°F | Ht 64.5 in | Wt 234.0 lb

## 2024-07-04 DIAGNOSIS — N760 Acute vaginitis: Secondary | ICD-10-CM | POA: Diagnosis not present

## 2024-07-04 DIAGNOSIS — B9689 Other specified bacterial agents as the cause of diseases classified elsewhere: Secondary | ICD-10-CM

## 2024-07-04 DIAGNOSIS — E1169 Type 2 diabetes mellitus with other specified complication: Secondary | ICD-10-CM

## 2024-07-04 DIAGNOSIS — I10 Essential (primary) hypertension: Secondary | ICD-10-CM

## 2024-07-04 DIAGNOSIS — N898 Other specified noninflammatory disorders of vagina: Secondary | ICD-10-CM

## 2024-07-04 DIAGNOSIS — D508 Other iron deficiency anemias: Secondary | ICD-10-CM

## 2024-07-04 DIAGNOSIS — E785 Hyperlipidemia, unspecified: Secondary | ICD-10-CM

## 2024-07-04 LAB — WET PREP FOR TRICH, YEAST, CLUE

## 2024-07-04 MED ORDER — METFORMIN HCL ER 500 MG PO TB24
500.0000 mg | ORAL_TABLET | Freq: Two times a day (BID) | ORAL | 4 refills | Status: AC
Start: 2024-07-04 — End: ?

## 2024-07-04 MED ORDER — OLMESARTAN MEDOXOMIL 40 MG PO TABS: 40.0000 mg | ORAL_TABLET | Freq: Every day | ORAL | 3 refills | Status: DC

## 2024-07-04 MED ORDER — METRONIDAZOLE 500 MG PO TABS: 500.0000 mg | ORAL_TABLET | Freq: Two times a day (BID) | ORAL | 0 refills | Status: AC

## 2024-07-04 MED ORDER — ROSUVASTATIN CALCIUM 10 MG PO TABS: 10.0000 mg | ORAL_TABLET | Freq: Every day | ORAL | 3 refills | Status: AC

## 2024-07-04 MED ORDER — IRON (FERROUS SULFATE) 325 (65 FE) MG PO TABS: 325.0000 mg | ORAL_TABLET | ORAL | 1 refills | Status: AC

## 2024-07-04 NOTE — Assessment & Plan Note (Signed)
 Start Ferrous Sulfate  325mg  every other day. Repeat 6 weeks.

## 2024-07-04 NOTE — Assessment & Plan Note (Signed)
Start flagyl 500mg  BID x7d

## 2024-07-04 NOTE — Assessment & Plan Note (Signed)
 A1c 6.2% and uACR >300. Increase Metformin  to XR 500mg  BID, repeat uACR in 4 weeks.   Foot exam UTD. Vaccines UTD. Retinal eye exam UTD. Recommend heart healthy diet such as Mediterranean diet with whole grains, fruits, vegetable, fish, lean meats, nuts, and olive oil. Limit salt. Encouraged moderate walking, 3-5 times/week for 30-50 minutes each session. Aim for at least 150 minutes.week. Goal should be pace of 3 miles/hours, or walking 1.5 miles in 30 minutes. Seek medical care for urinary frequency, extreme thirst, vision changes, lightheadedness, dizziness.  Follow up in 6 weeks or sooner if needed.

## 2024-07-04 NOTE — Progress Notes (Signed)
 Subjective:  HPI: Ariel [name] is a 53 y.o. female presenting on 07/04/2024 for Acute Visit (Vaginal itching and pressure since Thursday )   HPI Patient is in today for vaginal itching and lab discussion. She reports several days of vaginal itching. Denies vaginal discharge, abnormal uterine bleeding, dysuria, hematuria, polyuria, pelvic pain, new sexual partner.  HTN: uncontrolled, hyperkalemia on labs, DC olmesartan  hydrochlorothiazide  and start olmesartan  40mg  daily, monitor BP at home, denies chest pain, palpitations, recurrent headaches, vision changes, lightheadedness, dizziness, dyspnea on exertion, or swelling of extremities.  DM2: A1c 6.2%, change Metformin  to XR 500mg  BID Lab Results  Component Value Date   HGBA1C 6.2 (H) 06/30/2024   HGBA1C 5.9 (H) 02/29/2024   HGBA1C 5.8 05/13/2022    Anemia: iron  panel pending, likely dietary, denies melena or hematochezia, start ferrous sulfate  325 mg every other day Hemoglobin & Hematocrit     Component Value Date/Time   HGB 11.3 (L) 06/30/2024 0930   HGB 12.6 02/29/2024 1052   HCT 35.0 06/30/2024 0930   HCT 40.4 02/29/2024 1052     HLD: discussed goal LDL <70, is ok with starting statin The 10-year ASCVD risk score (Arnett DK, et al., 2019) is: 17% Lipid Panel     Component Value Date/Time   CHOL 177 06/30/2024 0930   CHOL 234 (H) 02/29/2024 1052   TRIG 119 06/30/2024 0930   HDL 42 (L) 06/30/2024 0930   HDL 49 02/29/2024 1052   CHOLHDL 4.2 06/30/2024 0930   VLDL 18.4 05/13/2022 0841   LDLCALC 112 (H) 06/30/2024 0930   LABVLDL 22 02/29/2024 1052     Review of Systems  All other systems reviewed and are negative.   Relevant past medical history reviewed and updated as indicated.   Past Medical History:  Diagnosis Date   ADD (attention deficit disorder)    Boil of buttock 09/12/2015   comes and goes- not at present   BV (bacterial vaginosis) 08/09/2013   03-05-16 no problems now   Diabetes mellitus without  complication (HCC)    GERD (gastroesophageal reflux disease)    Heartburn    Hypertension    Obesity    Panic attack 07/13/2015   wakes up from sleep with panic attack   Pre-diabetes    Transfusion history    25 yrs ago after childbirth -NVD   Vaginal discharge 04/15/2016   Vaginal itching 08/09/2013   no problem now   Yeast infection 04/15/2016     Past Surgical History:  Procedure Laterality Date   COLONOSCOPY WITH PROPOFOL  N/A 12/20/2023   Procedure: COLONOSCOPY WITH PROPOFOL ;  Surgeon: Shaaron Lamar HERO, MD;  Location: [facility];  Service: Endoscopy;  Laterality: N/A;  1230pm, asa 2   LAPAROSCOPIC GASTRIC SLEEVE RESECTION N/A 03/17/2016   Procedure: LAPAROSCOPIC GASTRIC SLEEVE RESECTION WITH UPPER ENDOSCOPY;  Surgeon: Alm Angle, MD;  Location: [facility];  Service: General;  Laterality: N/A;   TUBAL LIGATION     UPPER GI ENDOSCOPY  03/17/2016   Procedure: UPPER GI ENDOSCOPY;  Surgeon: Alm Angle, MD;  Location: [facility];  Service: General;;   WISDOM TOOTH EXTRACTION      Allergies and medications reviewed and updated.   Current Outpatient Medications:    escitalopram  (LEXAPRO ) 10 MG tablet, Take 1 tablet (10 mg total) by mouth daily., Disp: 30 tablet, Rfl: 6   fluticasone  (FLONASE ) 50 MCG/ACT nasal spray, 1 spray each nostril following sinus rinses twice daily, Disp: 16 g, Rfl: 2   Iron , Ferrous Sulfate ,  325 (65 Fe) MG TABS, Take 325 mg by mouth every other day., Disp: 30 tablet, Rfl: 1   magnesium hydroxide (PHILLIPS CHEWS) 311 MG CHEW chewable tablet, Chew 311 mg by mouth every 4 (four) hours as needed., Disp: , Rfl:    metFORMIN  (GLUCOPHAGE -XR) 500 MG 24 hr tablet, Take 1 tablet (500 mg total) by mouth 2 (two) times daily with a meal., Disp: 60 tablet, Rfl: 4   metroNIDAZOLE  (FLAGYL ) 500 MG tablet, Take 1 tablet (500 mg total) by mouth 2 (two) times daily for 7 days., Disp: 14 tablet, Rfl: 0   olmesartan  (BENICAR ) 40 MG tablet, Take 1 tablet (40 mg total) by mouth daily.,  Disp: 30 tablet, Rfl: 3   rosuvastatin  (CRESTOR ) 10 MG tablet, Take 1 tablet (10 mg total) by mouth daily., Disp: 30 tablet, Rfl: 3   Vitamin D , Ergocalciferol , (DRISDOL ) 1.25 MG (50000 UNIT) CAPS capsule, Take 1 capsule (50,000 Units total) by mouth every 7 (seven) days., Disp: 5 capsule, Rfl: 1  No Known Allergies  Objective:   BP (!) 140/84   Pulse 84   Temp 97.7 F (36.5 C)   Ht 5' 4.5 (1.638 m)   Wt 234 lb (106.1 kg)   LMP 04/11/2024 (Approximate)   SpO2 98%   BMI 39.55 kg/m      07/04/2024    9:19 AM 06/29/2024   11:12 AM 06/06/2024   10:52 AM  Vitals with BMI  Height 5' 4.5 5' 4.5   Weight 234 lbs 232 lbs 6 oz   BMI 39.56 39.29   Systolic 140 122 853  Diastolic 84 75 83  Pulse 84 90      Physical Exam Vitals and nursing note reviewed.  Constitutional:      Appearance: Normal appearance. She is obese.  HENT:     Head: Normocephalic and atraumatic.  Cardiovascular:     Rate and Rhythm: Normal rate and regular rhythm.     Pulses: Normal pulses.     Heart sounds: Normal heart sounds.  Pulmonary:     Effort: Pulmonary effort is normal.     Breath sounds: Normal breath sounds.  Skin:    General: Skin is warm and dry.  Neurological:     General: No focal deficit present.     Mental Status: She is alert and oriented to person, place, and time. Mental status is at baseline.  Psychiatric:        Mood and Affect: Mood normal.        Behavior: Behavior normal.        Thought Content: Thought content normal.        Judgment: Judgment normal.     Assessment & Plan:  Type 2 diabetes mellitus with obesity Assessment & Plan: A1c 6.2% and uACR >300. Increase Metformin  to XR 500mg  BID, repeat uACR in 4 weeks.   Foot exam UTD. Vaccines UTD. Retinal eye exam UTD. Recommend heart healthy diet such as Mediterranean diet with whole grains, fruits, vegetable, fish, lean meats, nuts, and olive oil. Limit salt. Encouraged moderate walking, 3-5 times/week for 30-50 minutes  each session. Aim for at least 150 minutes.week. Goal should be pace of 3 miles/hours, or walking 1.5 miles in 30 minutes. Seek medical care for urinary frequency, extreme thirst, vision changes, lightheadedness, dizziness.  Follow up in 6 weeks or sooner if needed.  Orders: -     CBC with Differential/Platelet; Future -     Comprehensive metabolic panel with GFR; Future -  Lipid panel; Future -     Microalbumin / creatinine urine ratio; Future  Vaginal itching -     WET PREP FOR TRICH, YEAST, CLUE  Essential hypertension, benign Assessment & Plan: DC olmesartan -hydrochlorothiazide  20-12.5. start olmesartan  40mg  daily. Montior at home and report to office if sustains >160/100 or <100/60. Recommend heart healthy diet such as Mediterranean diet with whole grains, fruits, vegetable, fish, lean meats, nuts, and olive oil. Limit salt. Encouraged moderate walking, 3-5 times/week for 30-50 minutes each session. Aim for at least 150 minutes.week. Goal should be pace of 3 miles/hours, or walking 1.5 miles in 30 minutes. Avoid tobacco products. Avoid excess alcohol. Take medications as prescribed and bring medications and blood pressure log with cuff to each office visit. Seek medical care for chest pain, palpitations, shortness of breath with exertion, dizziness/lightheadedness, vision changes, recurrent headaches, or swelling of extremities. Follow up in 4-6 weeks with labs week prior   Other iron  deficiency anemia Assessment & Plan: Start Ferrous Sulfate  325mg  every other day. Repeat 6 weeks.   Bacterial vaginosis Assessment & Plan: Start flagyl  500mg  BID x7d.    Hyperlipidemia associated with type 2 diabetes mellitus (HCC) Assessment & Plan: Start rosuvastatin  10mg  daily. Your labs showed elevated cholesterol. I recommend consuming a heart healthy diet such as Mediterranean diet or DASH diet with whole grains, fruits, vegetable, fish, lean meats, nuts, and olive oil. Limit sweets and  processed foods. I also encourage moderate intensity exercise 150 minutes weekly. This is 3-5 times weekly for 30-50 minutes each session. Goal should be pace of 3 miles/hours, or walking 1.5 miles in 30 minutes. The 10-year ASCVD risk score (Arnett DK, et al., 2019) is: 17%    Morbid obesity (HCC) Assessment & Plan: Followed by [facility], considering GLP   Other orders -     Olmesartan  Medoxomil; Take 1 tablet (40 mg total) by mouth daily.  Dispense: 30 tablet; Refill: 3 -     Rosuvastatin  Calcium ; Take 1 tablet (10 mg total) by mouth daily.  Dispense: 30 tablet; Refill: 3 -     Iron  (Ferrous Sulfate ); Take 325 mg by mouth every other day.  Dispense: 30 tablet; Refill: 1 -     metFORMIN  HCl ER; Take 1 tablet (500 mg total) by mouth 2 (two) times daily with a meal.  Dispense: 60 tablet; Refill: 4 -     metroNIDAZOLE ; Take 1 tablet (500 mg total) by mouth 2 (two) times daily for 7 days.  Dispense: 14 tablet; Refill: 0     Follow up plan: Return in about 6 weeks (around 08/15/2024) for chronic follow-up with labs 1 week prior.  Jeoffrey GORMAN Barrio, FNP

## 2024-07-04 NOTE — Assessment & Plan Note (Signed)
 Followed by [facility], considering GLP

## 2024-07-04 NOTE — Assessment & Plan Note (Signed)
 Start rosuvastatin  10mg  daily. Your labs showed elevated cholesterol. I recommend consuming a heart healthy diet such as Mediterranean diet or DASH diet with whole grains, fruits, vegetable, fish, lean meats, nuts, and olive oil. Limit sweets and processed foods. I also encourage moderate intensity exercise 150 minutes weekly. This is 3-5 times weekly for 30-50 minutes each session. Goal should be pace of 3 miles/hours, or walking 1.5 miles in 30 minutes. The 10-year ASCVD risk score (Arnett DK, et al., 2019) is: 17%

## 2024-07-04 NOTE — Assessment & Plan Note (Signed)
 DC olmesartan -hydrochlorothiazide  20-12.5. start olmesartan  40mg  daily. Montior at home and report to office if sustains >160/100 or <100/60. Recommend heart healthy diet such as Mediterranean diet with whole grains, fruits, vegetable, fish, lean meats, nuts, and olive oil. Limit salt. Encouraged moderate walking, 3-5 times/week for 30-50 minutes each session. Aim for at least 150 minutes.week. Goal should be pace of 3 miles/hours, or walking 1.5 miles in 30 minutes. Avoid tobacco products. Avoid excess alcohol. Take medications as prescribed and bring medications and blood pressure log with cuff to each office visit. Seek medical care for chest pain, palpitations, shortness of breath with exertion, dizziness/lightheadedness, vision changes, recurrent headaches, or swelling of extremities. Follow up in 4-6 weeks with labs week prior

## 2024-07-05 LAB — IRON,TIBC AND FERRITIN PANEL
%SAT: 24 % (ref 16–45)
Ferritin: 50 ng/mL (ref 16–232)
Iron: 69 ug/dL (ref 45–160)
TIBC: 293 ug/dL (ref 250–450)

## 2024-07-05 LAB — CBC WITH DIFFERENTIAL/PLATELET
Absolute Lymphocytes: 1566 {cells}/uL (ref 850–3900)
Absolute Monocytes: 500 {cells}/uL (ref 200–950)
Basophils Absolute: 22 {cells}/uL (ref 0–200)
Basophils Relative: 0.6 %
Eosinophils Absolute: 101 {cells}/uL (ref 15–500)
Eosinophils Relative: 2.8 %
HCT: 35 % (ref 35.0–45.0)
Hemoglobin: 11.3 g/dL — ABNORMAL LOW (ref 11.7–15.5)
MCH: 26.8 pg — ABNORMAL LOW (ref 27.0–33.0)
MCHC: 32.3 g/dL (ref 32.0–36.0)
MCV: 83.1 fL (ref 80.0–100.0)
MPV: 11.1 fL (ref 7.5–12.5)
Monocytes Relative: 13.9 %
Neutro Abs: 1411 {cells}/uL — ABNORMAL LOW (ref 1500–7800)
Neutrophils Relative %: 39.2 %
Platelets: 268 Thousand/uL (ref 140–400)
RBC: 4.21 Million/uL (ref 3.80–5.10)
RDW: 12.8 % (ref 11.0–15.0)
Total Lymphocyte: 43.5 %
WBC: 3.6 Thousand/uL — ABNORMAL LOW (ref 3.8–10.8)

## 2024-07-05 LAB — COMPREHENSIVE METABOLIC PANEL WITH GFR
AG Ratio: 1.5 (calc) (ref 1.0–2.5)
ALT: 15 U/L (ref 6–29)
AST: 19 U/L (ref 10–35)
Albumin: 3.9 g/dL (ref 3.6–5.1)
Alkaline phosphatase (APISO): 69 U/L (ref 37–153)
BUN: 10 mg/dL (ref 7–25)
CO2: 25 mmol/L (ref 20–32)
Calcium: 8.6 mg/dL (ref 8.6–10.4)
Chloride: 104 mmol/L (ref 98–110)
Creat: 0.72 mg/dL (ref 0.50–1.03)
Globulin: 2.6 g/dL (ref 1.9–3.7)
Glucose, Bld: 129 mg/dL — ABNORMAL HIGH (ref 65–99)
Potassium: 3.4 mmol/L — ABNORMAL LOW (ref 3.5–5.3)
Sodium: 140 mmol/L (ref 135–146)
Total Bilirubin: 0.4 mg/dL (ref 0.2–1.2)
Total Protein: 6.5 g/dL (ref 6.1–8.1)
eGFR: 99 mL/min/1.73m2 (ref >=60)

## 2024-07-05 LAB — TEST AUTHORIZATION

## 2024-07-05 LAB — LIPID PANEL
Cholesterol: 177 mg/dL (ref <200)
HDL: 42 mg/dL — ABNORMAL LOW (ref >=50)
LDL Cholesterol (Calc): 112 mg/dL — ABNORMAL HIGH
Non-HDL Cholesterol (Calc): 135 mg/dL — ABNORMAL HIGH (ref <130)
Total CHOL/HDL Ratio: 4.2 (calc) (ref <5.0)
Triglycerides: 119 mg/dL (ref <150)

## 2024-07-05 LAB — MICROALBUMIN / CREATININE URINE RATIO
Creatinine, Urine: 340 mg/dL — ABNORMAL HIGH (ref 20–275)
Microalb Creat Ratio: 4 mg/g{creat} (ref <30)
Microalb, Ur: 1.4 mg/dL

## 2024-07-05 LAB — HEMOGLOBIN A1C
Hgb A1c MFr Bld: 6.2 % — ABNORMAL HIGH (ref <5.7)
Mean Plasma Glucose: 131 mg/dL
eAG (mmol/L): 7.3 mmol/L

## 2024-07-05 LAB — VITAMIN D 25 HYDROXY (VIT D DEFICIENCY, FRACTURES): Vit D, 25-Hydroxy: 65 ng/mL (ref 30–100)

## 2024-07-05 LAB — VITAMIN B12: Vitamin B-12: 418 pg/mL (ref 200–1100)

## 2024-07-10 ENCOUNTER — Encounter (INDEPENDENT_AMBULATORY_CARE_PROVIDER_SITE_OTHER): Payer: Self-pay | Admitting: Family Medicine

## 2024-07-10 ENCOUNTER — Ambulatory Visit (INDEPENDENT_AMBULATORY_CARE_PROVIDER_SITE_OTHER): Admitting: Family Medicine

## 2024-07-10 VITALS — BP 132/73 | HR 75 | Temp 97.9°F | Ht 64.5 in | Wt 232.0 lb

## 2024-07-10 DIAGNOSIS — E119 Type 2 diabetes mellitus without complications: Secondary | ICD-10-CM

## 2024-07-10 DIAGNOSIS — E65 Localized adiposity: Secondary | ICD-10-CM

## 2024-07-10 DIAGNOSIS — Z6839 Body mass index (BMI) 39.0-39.9, adult: Secondary | ICD-10-CM

## 2024-07-10 DIAGNOSIS — E1159 Type 2 diabetes mellitus with other circulatory complications: Secondary | ICD-10-CM

## 2024-07-10 DIAGNOSIS — E1169 Type 2 diabetes mellitus with other specified complication: Secondary | ICD-10-CM

## 2024-07-10 DIAGNOSIS — I152 Hypertension secondary to endocrine disorders: Secondary | ICD-10-CM

## 2024-07-10 DIAGNOSIS — E66812 Obesity, class 2: Secondary | ICD-10-CM

## 2024-07-10 DIAGNOSIS — E785 Hyperlipidemia, unspecified: Secondary | ICD-10-CM | POA: Diagnosis not present

## 2024-07-10 DIAGNOSIS — E559 Vitamin D deficiency, unspecified: Secondary | ICD-10-CM

## 2024-07-10 DIAGNOSIS — Z7984 Long term (current) use of oral hypoglycemic drugs: Secondary | ICD-10-CM

## 2024-07-10 DIAGNOSIS — Z6838 Body mass index (BMI) 38.0-38.9, adult: Secondary | ICD-10-CM

## 2024-07-10 MED ORDER — TIRZEPATIDE 2.5 MG/0.5ML ~~LOC~~ SOAJ
2.5000 mg | SUBCUTANEOUS | 0 refills | Status: DC
Start: 2024-07-10 — End: 2024-08-09

## 2024-07-10 NOTE — Patient Instructions (Signed)
 The 10-year ASCVD risk score (Arnett DK, et al., 2019) is: 13.9%   Values used to calculate the score:     Age: 53 years     Clincally relevant sex: Female     Is Non-Hispanic African American: Yes     Diabetic: Yes     Tobacco smoker: No     Systolic Blood Pressure: 132 mmHg     Is BP treated: Yes     HDL Cholesterol: 42 mg/dL     Total Cholesterol: 177 mg/dL

## 2024-07-10 NOTE — Progress Notes (Signed)
 [name], D.O.  ABFM, ABOM Specializing in Clinical Bariatric Medicine  Office located at: [address] [place], [place]  [address]   Assessment and Plan:  No orders of the defined types were placed in this encounter.   There are no discontinued medications.   No orders of the defined types were placed in this encounter.     FOR THE DISEASE OF OBESITY:    Class 2 obesity - START BMI 38.04 (02/29/2024) BMI 38.0-38.9,adult current 39.22 Assessment & Plan:    06/06/24 07/10/24 09:00   Body Fat % 46.7 % 47 %  Muscle Mass (lbs) 117.6 lbs 117.2 lbs  Fat Mass (lbs) 108.6 lbs 109.4 lbs  Total Body Water (lbs) 86.4 lbs 86 lbs  Visceral Fat Rating  14 14     Total lbs lost to date: +7 lbs Total weight loss percentage to date: +3.11 %   Recommended Dietary Goals Ariel [name] is currently in the action stage of change. As such, her goal is to continue weight management plan.  She has agreed to: switch to 1000-1050 calories to 80+ grams of protein per day.   Behavioral Intervention We discussed the following today: increasing lean protein intake to established goals and using GPT or another AI platform for recipe ideas- searching low calorie, low carb, high protein chicken recipes etc  Additional resources provided today: {DOhandouts:[id number]::None}  Evidence-based interventions for health behavior change were utilized today including the discussion of self monitoring techniques, problem-solving barriers and SMART goal setting techniques.   Regarding patient's less desirable eating habits and patterns, we employed the technique of small changes.   Goal(s) for next OV: ***    Recommended Physical Activity Goals Ariel [name] has been advised to work up to 300-450 minutes of moderate intensity aerobic activity a week and strengthening exercises 2-3 times per week for cardiovascular health, weight loss maintenance and preservation of muscle mass.   She has agreed to:  {EMEXERCISE:[id number]::Think about enjoyable ways to increase daily physical activity and overcoming barriers to exercise,Increase physical activity in their day and reduce sedentary time (increase NEAT).,Increase volume of physical activity to a goal of 240 minutes a week,Combine aerobic and strengthening exercises for efficiency and improved cardiometabolic health.}   Pharmacotherapy We both agreed to: Start Mounjaro 2.5 mg weekly. Was educated about the possible side effects.    ASSOCIATED CONDITIONS ADDRESSED TODAY:  Type 2 diabetes mellitus with obesity Assessment & Plan:  Lab Results  Component Value Date   HGBA1C 6.2 (H) 06/30/2024   HGBA1C 5.9 (H) 02/29/2024   HGBA1C 5.8 05/13/2022   INSULIN  11.4 02/29/2024   Lab Results  Component Value Date   CREATININE 0.72 06/30/2024   BUN 10 06/30/2024   NA 140 06/30/2024   K 3.4 (L) 06/30/2024   CL 104 06/30/2024   CO2 25 06/30/2024  Potassium levels elevated.  Reviewed labs with pt from 06/30/2024. A1c increased to 6.2 from 5.9 level Hunger and cravings controlled.   Was on metformin  500 mg 1/2 tab at lunch and 1/2 tab at dinner switch to 2 tablets once a day in the morning.   Discussed the option of starting Mounjaro and educated about the possible side effect. Started 2.5 mg Mounjaro once a week.      Hypertension associated with diabetes (HCC) Assessment & Plan:  Last 3 blood pressure readings in our office are as follows: BP Readings from Last 3 Encounters:  07/10/24 132/73  07/04/24 (!) 140/84  06/29/24 122/75   The  10-year ASCVD risk score (Arnett DK, et al., 2019) is: 13.9%  Lab Results  Component Value Date   CREATININE 0.72 06/30/2024   - Othelia T Lovering BP is 132/73 stable today. Optimal level <120/80.    Didn't increase benicar  to 1 tablet after 5 days.  But good compliance with amlodipine    Hyperlipidemia associated with type 2 diabetes mellitus [facility] Assessment & Plan:  Lab Results   Component Value Date   CHOL 177 06/30/2024   HDL 42 (L) 06/30/2024   LDLCALC 112 (H) 06/30/2024   TRIG 119 06/30/2024   CHOLHDL 4.2 06/30/2024  Reviewed labs with pt from 06/30/2024. HDL decreased from 49 on (4/79/7974)  to 42 (06/30/2024) optimal >60. LDL has decreased from 163 on 02/29/2024 to 112, optimal level <70.     Visceral obesity Assessment & Plan:  Current visceral fat rating: 14.  The visceral fat rating should be < 12 in a female.  Visceral adipose tissue is a hormonally active component of total body fat. This body composition phenotype is associated with medical disorders such as metabolic syndrome, cardiovascular disease and several malignancies including prostate, breast, and colorectal cancers.Cont. lose 7-10% of weight via prudent nutritional plan and lifestyle changes   Vitamin D  deficiency Assessment & Plan:   Lab Results  Component Value Date   VD25OH 65 06/30/2024   VD25OH 28.2 (L) 02/29/2024   VD25OH 35 11/01/2020   Currently on  labs with pt from 06/30/2024. Vit D levels at goal today of 65 (optimal 50-70.  Plan:*** - I discussed the importance of vitamin D  to the patient's health and well-being as well as to their ability to lose weight.  - I reviewed possible symptoms of low Vitamin D :  low energy, depressed mood, muscle aches, joint aches, osteoporosis etc. with patient - It has been show that administration of vitamin D  supplementation leads to improved satiety and a decrease in inflammatory markers.  Hence, low Vitamin D  levels may be linked to an increased risk of cardiovascular events and even increased risk of cancers- such as colon and breast. - ideal vitamin D  levels reviewed with patient   - Informed patient this may be a lifelong thing, and she was encouraged to continue to take the medicine until told otherwise.    - weight loss will likely improve availability of vitamin D , thus encouraged Ariel [name] to continue with meal plan and their weight loss  efforts to further improve this condition.  Thus, we will need to monitor levels regularly (every 3-4 mo on average) to keep levels within normal limits and prevent over supplementation. -            Follow up:   No follow-ups on file. *** She was informed of the importance of frequent follow up visits to maximize her success with intensive lifestyle modifications for her multiple health conditions.    Subjective:    Chief complaint: Obesity Ariel [name] is here to discuss her progress with her obesity treatment plan. She is journaling 1100 calories and 80++ grams protein daily with the CAT 1 MP as a guide and states she is following her eating plan approximately 55% of the time. Pt is walking 2 miles 3 days a week.    1000-1250 calories to 80+ grams of protein.    Interval History:  Ariel [name] is here for a follow up office visit. Pt has experienced no weight change since last OV on 06/06/2024.   Not getting enough calroies usually aroudn 800-900 calories.  Their dietary and life style habits include:  - Tracking Calories/Macros: yes  - Eating More Whole Foods: yes  - Adequate Protein Intake: no - ***  - Adequate Water Intake: no - ***  - Skipping Meals: yes  - Sleeping 7-9 Hours/ Night: no - ***    Pharmacotherapy that aid with weight loss: She is currently taking Metformin  XR 500 mg daily???   Review of Systems:  Pertinent positives were addressed with patient today.  Reviewed by clinician on day of visit: allergies, medications, problem list, medical history, surgical history, family history, social history, and previous encounter notes.  Weight Summary and Biometrics   Weight Lost Since Last Visit: 0lb  Weight Gained Since Last Visit: 0lb  ***  Vitals Temp: 97.9 F (36.6 C) BP: 132/73 Pulse Rate: 75 SpO2: 98 %   Anthropometric Measurements Height: 5' 4.5 (1.638 m) Weight: 232 lb (105.2 kg) BMI (Calculated): 39.22 Weight at Last Visit:  232lb Weight Lost Since Last Visit: 0lb Weight Gained Since Last Visit: 0lb Starting Weight: 225lb Total Weight Loss (lbs): 0 lb (0 kg) Peak Weight: 259lb   Body Composition  Body Fat %: 47 % Fat Mass (lbs): 109.4 lbs Muscle Mass (lbs): 117.2 lbs Total Body Water (lbs): 86 lbs Visceral Fat Rating : 14   Other Clinical Data Fasting: No Labs: Yes Today's Visit #: 6 Starting Date: 02/29/24    Objective:   PHYSICAL EXAM: Blood pressure 132/73, pulse 75, temperature 97.9 F (36.6 C), height 5' 4.5 (1.638 m), weight 232 lb (105.2 kg), last menstrual period 04/11/2024, SpO2 98%. Body mass index is 39.21 kg/m.  General: she is overweight, cooperative and in no acute distress. PSYCH: Has normal mood, affect and thought process.   HEENT: EOMI, sclerae are anicteric. Lungs: Normal breathing effort, no conversational dyspnea. Extremities: Moves * 4 Neurologic: A and O * 3, good insight  DIAGNOSTIC DATA REVIEWED: BMET    Component Value Date/Time   NA 140 06/30/2024 0930   NA 139 02/29/2024 1052   K 3.4 (L) 06/30/2024 0930   CL 104 06/30/2024 0930   CO2 25 06/30/2024 0930   GLUCOSE 129 (H) 06/30/2024 0930   BUN 10 06/30/2024 0930   BUN 9 02/29/2024 1052   CREATININE 0.72 06/30/2024 0930   CALCIUM  8.6 06/30/2024 0930   GFRNONAA 94 02/13/2021 1050   GFRAA 109 02/13/2021 1050   Lab Results  Component Value Date   HGBA1C 6.2 (H) 06/30/2024   HGBA1C 6.1 (H) 05/27/2012   Lab Results  Component Value Date   INSULIN  11.4 02/29/2024   Lab Results  Component Value Date   TSH 1.300 02/29/2024   CBC    Component Value Date/Time   WBC 3.6 (L) 06/30/2024 0930   RBC 4.21 06/30/2024 0930   HGB 11.3 (L) 06/30/2024 0930   HGB 12.6 02/29/2024 1052   HCT 35.0 06/30/2024 0930   HCT 40.4 02/29/2024 1052   PLT 268 06/30/2024 0930   PLT 266 02/29/2024 1052   MCV 83.1 06/30/2024 0930   MCV 85 02/29/2024 1052   MCH 26.8 (L) 06/30/2024 0930   MCHC 32.3 06/30/2024 0930    RDW 12.8 06/30/2024 0930   RDW 13.2 02/29/2024 1052   Iron  Studies    Component Value Date/Time   IRON  69 06/30/2024 0930   TIBC 293 06/30/2024 0930   FERRITIN 50 06/30/2024 0930   IRONPCTSAT 24 06/30/2024 0930   Lipid Panel     Component Value Date/Time   CHOL 177 06/30/2024  0930   CHOL 234 (H) 02/29/2024 1052   TRIG 119 06/30/2024 0930   HDL 42 (L) 06/30/2024 0930   HDL 49 02/29/2024 1052   CHOLHDL 4.2 06/30/2024 0930   VLDL 18.4 05/13/2022 0841   LDLCALC 112 (H) 06/30/2024 0930   Hepatic Function Panel     Component Value Date/Time   PROT 6.5 06/30/2024 0930   PROT 7.7 02/29/2024 1052   ALBUMIN 4.5 02/29/2024 1052   AST 19 06/30/2024 0930   ALT 15 06/30/2024 0930   ALKPHOS 99 02/29/2024 1052   BILITOT 0.4 06/30/2024 0930   BILITOT 0.5 02/29/2024 1052      Component Value Date/Time   TSH 1.300 02/29/2024 1052   Nutritional Lab Results  Component Value Date   VD25OH 65 06/30/2024   VD25OH 28.2 (L) 02/29/2024   VD25OH 35 11/01/2020    Attestations:   I, ***, acting as a medical scribe for Barnie Jenkins, DO., have compiled all relevant documentation for today's office visit on behalf of Barnie Jenkins, DO, while in the presence of [employer], DO.  I have spent 51 minutes in the care of the patient today including 40 minutes face-to-face assessing and reviewing listed medical problems above as outlined in office visit note and providing nutritional and behavioral counseling as outlined in obesity care plan.   I have reviewed the above documentation for accuracy and completeness, and I agree with the above. Barnie JINNY Jenkins, D.O.  The 21st Century Cures Act was signed into law in 2016 which includes the topic of electronic health records.  This provides immediate access to information in MyChart.  This includes consultation notes, operative notes, office notes, lab results and pathology reports.  If you have any questions about what you read please let us   know at your next visit so we can discuss your concerns and take corrective action if need be.  We are right here with you.

## 2024-08-01 ENCOUNTER — Encounter (HOSPITAL_COMMUNITY): Payer: Self-pay | Admitting: Radiology

## 2024-08-01 ENCOUNTER — Other Ambulatory Visit (HOSPITAL_COMMUNITY): Payer: Self-pay | Admitting: Family Medicine

## 2024-08-01 DIAGNOSIS — Z1231 Encounter for screening mammogram for malignant neoplasm of breast: Secondary | ICD-10-CM

## 2024-08-03 ENCOUNTER — Ambulatory Visit (HOSPITAL_COMMUNITY)
Admission: RE | Admit: 2024-08-03 | Discharge: 2024-08-03 | Disposition: A | Discharge status: Home / Self Care | Source: Ambulatory Visit | Attending: Family Medicine | Admitting: Family Medicine

## 2024-08-03 ENCOUNTER — Encounter (HOSPITAL_COMMUNITY): Payer: Self-pay

## 2024-08-03 DIAGNOSIS — Z1231 Encounter for screening mammogram for malignant neoplasm of breast: Secondary | ICD-10-CM | POA: Diagnosis present

## 2024-08-08 ENCOUNTER — Other Ambulatory Visit

## 2024-08-08 DIAGNOSIS — I10 Essential (primary) hypertension: Secondary | ICD-10-CM

## 2024-08-08 DIAGNOSIS — E1169 Type 2 diabetes mellitus with other specified complication: Secondary | ICD-10-CM

## 2024-08-08 DIAGNOSIS — E119 Type 2 diabetes mellitus without complications: Secondary | ICD-10-CM

## 2024-08-09 ENCOUNTER — Ambulatory Visit: Payer: Self-pay | Admitting: Family Medicine

## 2024-08-09 ENCOUNTER — Encounter (INDEPENDENT_AMBULATORY_CARE_PROVIDER_SITE_OTHER): Payer: Self-pay | Admitting: Family Medicine

## 2024-08-09 ENCOUNTER — Ambulatory Visit (INDEPENDENT_AMBULATORY_CARE_PROVIDER_SITE_OTHER): Admitting: Family Medicine

## 2024-08-09 VITALS — BP 149/88 | HR 73 | Temp 97.6°F | Ht 64.5 in | Wt 228.0 lb

## 2024-08-09 DIAGNOSIS — E538 Deficiency of other specified B group vitamins: Secondary | ICD-10-CM

## 2024-08-09 DIAGNOSIS — Z7984 Long term (current) use of oral hypoglycemic drugs: Secondary | ICD-10-CM

## 2024-08-09 DIAGNOSIS — E785 Hyperlipidemia, unspecified: Secondary | ICD-10-CM

## 2024-08-09 DIAGNOSIS — K5909 Other constipation: Secondary | ICD-10-CM

## 2024-08-09 DIAGNOSIS — E559 Vitamin D deficiency, unspecified: Secondary | ICD-10-CM

## 2024-08-09 DIAGNOSIS — E1169 Type 2 diabetes mellitus with other specified complication: Secondary | ICD-10-CM

## 2024-08-09 DIAGNOSIS — I152 Hypertension secondary to endocrine disorders: Secondary | ICD-10-CM | POA: Diagnosis not present

## 2024-08-09 DIAGNOSIS — Z7985 Long-term (current) use of injectable non-insulin antidiabetic drugs: Secondary | ICD-10-CM

## 2024-08-09 DIAGNOSIS — Z6838 Body mass index (BMI) 38.0-38.9, adult: Secondary | ICD-10-CM

## 2024-08-09 DIAGNOSIS — E669 Obesity, unspecified: Secondary | ICD-10-CM

## 2024-08-09 DIAGNOSIS — E1159 Type 2 diabetes mellitus with other circulatory complications: Secondary | ICD-10-CM | POA: Diagnosis not present

## 2024-08-09 DIAGNOSIS — E66812 Obesity, class 2: Secondary | ICD-10-CM

## 2024-08-09 LAB — COMPREHENSIVE METABOLIC PANEL WITH GFR
AG Ratio: 1.5 (calc) (ref 1.0–2.5)
ALT: 11 U/L (ref 6–29)
AST: 14 U/L (ref 10–35)
Albumin: 4.1 g/dL (ref 3.6–5.1)
Alkaline phosphatase (APISO): 67 U/L (ref 37–153)
BUN: 8 mg/dL (ref 7–25)
CO2: 28 mmol/L (ref 20–32)
Calcium: 8.6 mg/dL (ref 8.6–10.4)
Chloride: 105 mmol/L (ref 98–110)
Creat: 0.7 mg/dL (ref 0.50–1.03)
Globulin: 2.8 g/dL (ref 1.9–3.7)
Glucose, Bld: 80 mg/dL (ref 65–99)
Potassium: 3.8 mmol/L (ref 3.5–5.3)
Sodium: 139 mmol/L (ref 135–146)
Total Bilirubin: 0.6 mg/dL (ref 0.2–1.2)
Total Protein: 6.9 g/dL (ref 6.1–8.1)
eGFR: 103 mL/min/1.73m2 (ref >=60)

## 2024-08-09 LAB — LIPID PANEL
Cholesterol: 120 mg/dL (ref <200)
HDL: 44 mg/dL — ABNORMAL LOW (ref >=50)
LDL Cholesterol (Calc): 58 mg/dL
Non-HDL Cholesterol (Calc): 76 mg/dL (ref <130)
Total CHOL/HDL Ratio: 2.7 (calc) (ref <5.0)
Triglycerides: 95 mg/dL (ref <150)

## 2024-08-09 LAB — VITAMIN B12: Vitamin B-12: 356 pg/mL (ref 200–1100)

## 2024-08-09 MED ORDER — VITAMIN D (ERGOCALCIFEROL) 1.25 MG (50000 UNIT) PO CAPS: 50000.0000 [IU] | ORAL_CAPSULE | ORAL | 1 refills | Status: DC

## 2024-08-09 MED ORDER — POLYETHYLENE GLYCOL 3350 17 G PO PACK: 17.0000 g | PACK | Freq: Two times a day (BID) | ORAL | Status: AC

## 2024-08-09 MED ORDER — TIRZEPATIDE 2.5 MG/0.5ML ~~LOC~~ SOAJ: 2.5000 mg | SUBCUTANEOUS | 0 refills | Status: DC

## 2024-08-09 MED ORDER — CYANOCOBALAMIN 500 MCG PO TABS: 500.0000 ug | ORAL_TABLET | Freq: Every day | ORAL | Status: AC

## 2024-08-09 NOTE — Progress Notes (Signed)
 Ariel [name], D.O.  ABFM, ABOM Specializing in Clinical Bariatric Medicine  Office located at: [address] [place], [place]  [address]    FOR THE CHRONIC DISEASE OF OBESITY:   Class 2 obesity - START BMI 38.04 (02/29/2024); BMI 38.0-38.9,adult current 38.55  Weight Summary and Body Composition Analysis  Weight Lost Since Last Visit: 4lb  Weight Gained Since Last Visit: 0lb    Vitals Temp: 97.6 F (36.4 C) BP: (!) 149/88 Pulse Rate: 73 SpO2: 100 %   Anthropometric Measurements Height: 5' 4.5 (1.638 m) Weight: 228 lb (103.4 kg) BMI (Calculated): 38.55 Weight at Last Visit: 232lb Weight Lost Since Last Visit: 4lb Weight Gained Since Last Visit: 0lb Starting Weight: 225lb Total Weight Loss (lbs): 0 lb (0 kg) Peak Weight: 259lb   Body Composition  Body Fat %: 46 % Fat Mass (lbs): 105.2 lbs Muscle Mass (lbs): 117.2 lbs Total Body Water (lbs): 86.6 lbs Visceral Fat Rating : 14   Other Clinical Data Fasting: no Labs: no Today's Visit #: 7 Starting Date: 02/29/24    Chief complaint: Obesity  Interval History Ariel [name] is here for a follow-up office visit to discuss her progress with her obesity treatment plan. She is on {MWMwtlossportion/plan2:23431} and states she is following her eating plan approximately 70 % of the time. She is walking 30  minutes 3 days per week  She has experienced a weight loss of 4 lbs since last OV on 07/10/2024.   Her dietary and life habits include:  - Tracking Macros: yes - ***  - Eating More Whole Foods: yes  - Adequate Water Intake: no - drinking 24 ounces of water daily  - Skipping Meals: no  - Sleeping 7-9 Hours/ Night: no    07/10/24 09:00 08/09/24   Body Fat % 47 % 46 %  Muscle Mass (lbs) 117.2 lbs 117.2 lbs  Fat Mass (lbs) 109.4 lbs 105.2 lbs  Total Body Water (lbs) 86 lbs 86.6 lbs  Visceral Fat Rating  14 14   Counseling done on how various foods will affect these numbers and how to  maximize success  Total lbs lost to date: *** lbs Total Fat Mass lost to date: *** lbs Total weight loss percentage to date: *** %   Nutritional and Behavioral Counseling:  We discussed the following today: {spwtlossstrategies:33761}  Additional resources provided today: {DOhandouts:31655::None}  Evidence-based interventions for health behavior change were utilized today including the discussion of self monitoring techniques, problem-solving barriers and SMART goal setting techniques.   Regarding patient's less desirable eating habits and patterns, we employed the technique of small changes.   SMART Goal(s) created today:    Recommended Dietary Goals Ariel [name] is currently in the action stage of change. As such, her goal is to continue weight management plan.  She has agreed to: {EMWTLOSSPLAN:29297::continue current plan}   Recommended Physical Activity Goals Ariel [name] has been advised to work up to 300-450 minutes of moderate intensity aerobic activity a week and strengthening exercises 2-3 times per week for cardiovascular health, weight loss maintenance and preservation of muscle mass.   She may Continue to gradually increase the amount and intensity of exercise routine   Medical Interventions and Pharmacotherapy Previous Bariatric surgery: S/P gastric sleeve procedure- 2017- Dr Ethyl [facility]  Pharmacotherapy: Cont Mounjaro at current dose.   OBESITY RELATED CONDITIONS ADDRESSED TODAY:    Medications Discontinued During This Encounter  Medication Reason   Vitamin D , Ergocalciferol , (DRISDOL ) 1.25 MG (50000 UNIT) CAPS capsule Reorder  tirzepatide (MOUNJARO) 2.5 MG/0.5ML Pen Reorder     Meds ordered this encounter  Medications   polyethylene glycol (MIRALAX / GLYCOLAX) 17 g packet    Sig: Take 17 g by mouth 2 (two) times daily. Until stooling regularly, then daily or prn   cyanocobalamin (VITAMIN B12) 500 MCG tablet    Sig: Take 1 tablet (500 mcg total) by mouth daily.    Vitamin D , Ergocalciferol , (DRISDOL ) 1.25 MG (50000 UNIT) CAPS capsule    Sig: Take 1 capsule (50,000 Units total) by mouth every 7 (seven) days.    Dispense:  5 capsule    Refill:  1   tirzepatide (MOUNJARO) 2.5 MG/0.5ML Pen    Sig: Inject 2.5 mg into the skin once a week.    Dispense:  2 mL    Refill:  0    Type 2 diabetes mellitus in patient with obesity Ariel [name]) Assessment & Plan: Lab Results  Component Value Date   HGBA1C 6.2 (H) 06/30/2024   HGBA1C 5.9 (H) 02/29/2024   HGBA1C 5.8 05/13/2022   INSULIN  11.4 02/29/2024      Component Value Date/Time   PROT 6.9 08/08/2024 0808   PROT 7.7 02/29/2024 1052   ALBUMIN 4.5 02/29/2024 1052   AST 14 08/08/2024 0808   ALT 11 08/08/2024 0808   ALKPHOS 99 02/29/2024 1052   BILITOT 0.6 08/08/2024 0808   BILITOT 0.5 02/29/2024 1052          Hyperlipidemia associated with type 2 diabetes mellitus Ariel [name]) Assessment & Plan: Lab Results  Component Value Date   CHOL 120 08/08/2024   HDL 44 (L) 08/08/2024   LDLCALC 58 08/08/2024   TRIG 95 08/08/2024   CHOLHDL 2.7 08/08/2024   On Crestor  10 mg with reported good compliance and tolerance. Reviewed labs that PCP obtained per pt request. Lipid panel WNL; TRIG, LDL, and HDL at goal. Continue statin therapy and working on nutrition plan -decreasing simple carbohydrates, increasing lean proteins, decreasing saturated fats and cholesterol , avoiding trans fats and exercise as able to promote weight loss and decrease cardiovascular risks.     Hypertension associated with diabetes Ariel [name]) Assessment & Plan: BP Readings from Last 3 Encounters:  08/09/24 (!) 149/88  07/10/24 132/73  07/04/24 (!) 140/84   Lab Results  Component Value Date   CREATININE 0.70 08/08/2024   BUN 8 08/08/2024   NA 139 08/08/2024   K 3.8 08/08/2024   CL 105 08/08/2024   CO2 28 08/08/2024   Reviewed recent renal parameters which are within acceptable ranges. BP above goal today. Pt completely asx. She sometimes  checks BP at home or work; averages 128/70s-80s. Goal BP: 130/80 or less on a regular basis. Check BP at home 2-3x/wk. F/up w/ PCP if BP is not at goal. Cont Olmesartan  40 mg daily and low sodium diet.    Other constipation Assessment & Plan: She reports experiencing increased constipation, which can be secondary to GLP-1 therapy, her daily iron  supplement, and her increased protein intake. Adequate hydration encouraged along with activity/ movement. Eat plenty of fiber (goal is over 25 grams each day). Pt also advised to begin Miralax twice daily until stooling regularly, then daily or prn.    Vitamin D  deficiency Assessment & Plan: Lab Results  Component Value Date   VD25OH 65 06/30/2024   VD25OH 28.2 (L) 02/29/2024   VD25OH 35 11/01/2020   Currently on Ergocalciferol  50,000 units weekly with good compliance and tolerance. No acute concerns. Continue regimen.  Recheck as deemed  medically necessary.    B12 insufficiency Assessment & Plan: Lab Results  Component Value Date   VITAMINB12 356 08/08/2024   B12 level is 356, not at goal of over 500. Discussed the importance of Vit B12 to the patients health. Pt is agreeable to STARTING OTC B12 500 mcg daily. Continue nutrient rich diet. Recheck: 3 months.    Objective:   PHYSICAL EXAM: Blood pressure (!) 149/88, pulse 73, temperature 97.6 F (36.4 C), height 5' 4.5 (1.638 m), weight 228 lb (103.4 kg), SpO2 100%. Body mass index is 38.53 kg/m.  General: she is overweight, cooperative and in no acute distress. PSYCH: Has normal mood, affect and thought process.   HEENT: EOMI, sclerae are anicteric. Lungs: Normal breathing effort, no conversational dyspnea. Extremities: Moves * 4 Neurologic: A and O * 3, good insight  DIAGNOSTIC DATA REVIEWED: BMET    Component Value Date/Time   NA 139 08/08/2024 0808   NA 139 02/29/2024 1052   K 3.8 08/08/2024 0808   CL 105 08/08/2024 0808   CO2 28 08/08/2024 0808   GLUCOSE 80  08/08/2024 0808   BUN 8 08/08/2024 0808   BUN 9 02/29/2024 1052   CREATININE 0.70 08/08/2024 0808   CALCIUM  8.6 08/08/2024 0808   GFRNONAA 94 02/13/2021 1050   GFRAA 109 02/13/2021 1050   Lab Results  Component Value Date   HGBA1C 6.2 (H) 06/30/2024   HGBA1C 6.1 (H) 05/27/2012   Lab Results  Component Value Date   INSULIN  11.4 02/29/2024   Lab Results  Component Value Date   TSH 1.300 02/29/2024   CBC    Component Value Date/Time   WBC 3.6 (L) 06/30/2024 0930   RBC 4.21 06/30/2024 0930   HGB 11.3 (L) 06/30/2024 0930   HGB 12.6 02/29/2024 1052   HCT 35.0 06/30/2024 0930   HCT 40.4 02/29/2024 1052   PLT 268 06/30/2024 0930   PLT 266 02/29/2024 1052   MCV 83.1 06/30/2024 0930   MCV 85 02/29/2024 1052   MCH 26.8 (L) 06/30/2024 0930   MCHC 32.3 06/30/2024 0930   RDW 12.8 06/30/2024 0930   RDW 13.2 02/29/2024 1052   Iron  Studies    Component Value Date/Time   IRON  69 06/30/2024 0930   TIBC 293 06/30/2024 0930   FERRITIN 50 06/30/2024 0930   IRONPCTSAT 24 06/30/2024 0930   Lipid Panel     Component Value Date/Time   CHOL 120 08/08/2024 0808   CHOL 234 (H) 02/29/2024 1052   TRIG 95 08/08/2024 0808   HDL 44 (L) 08/08/2024 0808   HDL 49 02/29/2024 1052   CHOLHDL 2.7 08/08/2024 0808   VLDL 18.4 05/13/2022 0841   LDLCALC 58 08/08/2024 0808   Hepatic Function Panel     Component Value Date/Time   PROT 6.9 08/08/2024 0808   PROT 7.7 02/29/2024 1052   ALBUMIN 4.5 02/29/2024 1052   AST 14 08/08/2024 0808   ALT 11 08/08/2024 0808   ALKPHOS 99 02/29/2024 1052   BILITOT 0.6 08/08/2024 0808   BILITOT 0.5 02/29/2024 1052      Component Value Date/Time   TSH 1.300 02/29/2024 1052   Nutritional Lab Results  Component Value Date   VD25OH 65 06/30/2024   VD25OH 28.2 (L) 02/29/2024   VD25OH 35 11/01/2020     Follow up:   Return 09/11/2024 at 10:20 AM.  She was informed of the importance of frequent follow up visits to maximize her success with intensive  lifestyle modifications for her multiple health  conditions.   Attestations:   I, Ariel [name], acting as a stage manager for [employer]el [employer] Mclennan, DO., have compiled all relevant documentation for today's office visit on behalf of Ariel Jenkins, DO, while in the presence of Ariel [employer] Mclennan, DO.  Pertinent positives were addressed with patient today. Reviewed by clinician on day of visit: allergies, medications, problem list, medical history, surgical history, family history, social history, and previous encounter notes.  I have reviewed the above documentation for accuracy and completeness, and I agree with the above. Ariel JINNY [name], D.O.  The 21st Century Cures Act was signed into law in 2016 which includes the topic of electronic health records.  This provides immediate access to information in MyChart. This includes consultation notes, operative notes, office notes, lab results and pathology reports.  If you have any questions about what you read please let us  know at your next visit so we can discuss your concerns and take corrective action if need be.  We are right here with you.

## 2024-08-15 ENCOUNTER — Ambulatory Visit: Admitting: Family Medicine

## 2024-08-15 ENCOUNTER — Encounter: Payer: Self-pay | Admitting: Family Medicine

## 2024-08-15 VITALS — BP 137/83 | HR 75 | Temp 98.3°F | Ht 64.5 in | Wt 232.6 lb

## 2024-08-15 DIAGNOSIS — E1169 Type 2 diabetes mellitus with other specified complication: Secondary | ICD-10-CM | POA: Diagnosis not present

## 2024-08-15 DIAGNOSIS — I10 Essential (primary) hypertension: Secondary | ICD-10-CM

## 2024-08-15 DIAGNOSIS — D508 Other iron deficiency anemias: Secondary | ICD-10-CM

## 2024-08-15 DIAGNOSIS — N3000 Acute cystitis without hematuria: Secondary | ICD-10-CM | POA: Insufficient documentation

## 2024-08-15 DIAGNOSIS — Z7984 Long term (current) use of oral hypoglycemic drugs: Secondary | ICD-10-CM

## 2024-08-15 DIAGNOSIS — E669 Obesity, unspecified: Secondary | ICD-10-CM

## 2024-08-15 DIAGNOSIS — Z7985 Long-term (current) use of injectable non-insulin antidiabetic drugs: Secondary | ICD-10-CM

## 2024-08-15 DIAGNOSIS — R300 Dysuria: Secondary | ICD-10-CM

## 2024-08-15 DIAGNOSIS — E785 Hyperlipidemia, unspecified: Secondary | ICD-10-CM

## 2024-08-15 LAB — CBC
HCT: 38.6 % (ref 35.0–45.0)
Hemoglobin: 12.3 g/dL (ref 11.7–15.5)
MCH: 26.6 pg — ABNORMAL LOW (ref 27.0–33.0)
MCHC: 31.9 g/dL — ABNORMAL LOW (ref 32.0–36.0)
MCV: 83.5 fL (ref 80.0–100.0)
MPV: 10.9 fL (ref 7.5–12.5)
Platelets: 284 Thousand/uL (ref 140–400)
RBC: 4.62 Million/uL (ref 3.80–5.10)
RDW: 12.9 % (ref 11.0–15.0)
WBC: 4.5 Thousand/uL (ref 3.8–10.8)

## 2024-08-15 LAB — URINALYSIS, ROUTINE W REFLEX MICROSCOPIC
Glucose, UA: NEGATIVE
Hgb urine dipstick: NEGATIVE
Hyaline Cast: NONE SEEN /LPF
Nitrite: NEGATIVE
RBC / HPF: NONE SEEN /HPF (ref 0–2)
Specific Gravity, Urine: 1.025 (ref 1.001–1.035)
pH: 5.5 (ref 5.0–8.0)

## 2024-08-15 LAB — MICROSCOPIC MESSAGE

## 2024-08-15 MED ORDER — SULFAMETHOXAZOLE-TRIMETHOPRIM 800-160 MG PO TABS: 1.0000 | ORAL_TABLET | Freq: Two times a day (BID) | ORAL | 0 refills | Status: DC

## 2024-08-15 NOTE — Assessment & Plan Note (Signed)
 Urine dipstick shows positive for protein, positive for leukocytes, positive for urobilinogen, and positive for ketones.  Micro exam: 10-20 WBC's per HPF and many+ bacteria. Start Bactrim  DS BID x5d. Follow up PRN

## 2024-08-15 NOTE — Assessment & Plan Note (Signed)
 Continue olmesartan  40mg  daily. Montior at home and report to office if sustains >130/80. Recommend heart healthy diet such as Mediterranean diet with whole grains, fruits, vegetable, fish, lean meats, nuts, and olive oil. Limit salt. Encouraged moderate walking, 3-5 times/week for 30-50 minutes each session. Aim for at least 150 minutes.week. Goal should be pace of 3 miles/hours, or walking 1.5 miles in 30 minutes. Avoid tobacco products. Avoid excess alcohol. Take medications as prescribed and bring medications and blood pressure log with cuff to each office visit. Seek medical care for chest pain, palpitations, shortness of breath with exertion, dizziness/lightheadedness, vision changes, recurrent headaches, or swelling of extremities. Follow up in 3 months with labs week prior

## 2024-08-15 NOTE — Progress Notes (Signed)
 Subjective:  HPI: Ariel [name] is a 53 y.o. female presenting on 08/15/2024 for Medical Management of Chronic Issues (6 wk f/u /Still experiencing discharge, and pain in bottom of abdomen after urinating since finishing antibiotic )   HPI Patient is in today for follow up for chronic condition management  DM2: on Metformin  XR 500mg  daily and Mounjaro 2.5mg  weekly, needs repeat uACR today Denies polyuria, polydypsia, paresthesias, or chest pain. Lab Results  Component Value Date   HGBA1C 6.2 (H) 06/30/2024   HGBA1C 5.9 (H) 02/29/2024   HGBA1C 5.8 05/13/2022   HTN: on Olmesartan  40mg  daily, 120/70s at home Denies chest pain, palpitations, recurrent headaches, vision changes, lightheadedness, dizziness, dyspnea on exertion, or swelling of extremities.   Anemia: on Ferrous Sulfate  325mg  every other day Denies lightheadedness, dizziness, chest pain, shortness of breath Hemoglobin & Hematocrit     Component Value Date/Time   HGB 11.3 (L) 06/30/2024 0930   HGB 12.6 02/29/2024 1052   HCT 35.0 06/30/2024 0930   HCT 40.4 02/29/2024 1052   HLD: on Rosuvastatin  10mg  daily Lipid Panel     Component Value Date/Time   CHOL 120 08/08/2024 0808   CHOL 234 (H) 02/29/2024 1052   TRIG 95 08/08/2024 0808   HDL 44 (L) 08/08/2024 0808   HDL 49 02/29/2024 1052   CHOLHDL 2.7 08/08/2024 0808   VLDL 18.4 05/13/2022 0841   LDLCALC 58 08/08/2024 0808   LABVLDL 22 02/29/2024 1052   Discussed the use of AI scribe software for clinical note transcription with the patient, who gave verbal consent to proceed.  History of Present Illness Ariel [name] is a 53 year old female with a history of diabetes, hypertension, hyperlipidemia, anemia, and obesity who presents for chronic condition management with ongoing dysuria and lower abdominal pressure when voiding.  She experiences pain in the lower abdomen, particularly after urination, accompanied by clear white vaginal discharge. The discharge  lacks a foul smell, which she notes is different from previous urinary tract infections. She has a history of bacterial vaginosis, for which she completed a course of metronidazole .  She has type 2 diabetes, managed with metformin  once daily and Mounjaro, an injectable medication at a dose of 0.5 mL. She reports a weight loss of six pounds since starting the injection. She experiences mild constipation, which she attributes to a high-protein diet. She takes ferrous sulfate  325 mg, though not daily, and is aware it can cause constipation. She is also on rosuvastatin  for cholesterol management.  Her blood pressure readings at home are typically around 125-128 mmHg systolic, but she notes a recent higher reading of 150 mmHg at a doctor's visit. She has not taken her blood pressure medication today, and her current reading is in the 120s over 70s. No vision changes, headaches, chest pain, shortness of breath, or swelling in her extremities.  She has a history of gastric sleeve surgery, which may affect her nutrient absorption.     Review of Systems  All other systems reviewed and are negative.   Relevant past medical history reviewed and updated as indicated.   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   Vaginal discharge 04/15/2016   Vaginal itching 08/09/2013   no problem now   Yeast infection 04/15/2016     Past Surgical History:  Procedure Laterality Date   COLONOSCOPY WITH PROPOFOL  N/A 12/20/2023   Procedure: COLONOSCOPY WITH PROPOFOL ;  Surgeon: Shaaron Lamar HERO, MD;  Location: [facility];  Service: Endoscopy;  Laterality: N/A;  1230pm, asa 2   LAPAROSCOPIC GASTRIC SLEEVE RESECTION N/A 03/17/2016   Procedure: LAPAROSCOPIC GASTRIC SLEEVE RESECTION WITH UPPER ENDOSCOPY;  Surgeon: Alm Angle, MD;  Location: [facility];  Service: General;  Laterality: N/A;   TUBAL LIGATION     UPPER GI ENDOSCOPY  03/17/2016   Procedure: UPPER GI ENDOSCOPY;  Surgeon: Alm Angle, MD;  Location: [facility];  Service: General;;   WISDOM TOOTH EXTRACTION      Allergies and medications reviewed and updated.   Current Outpatient Medications:    cyanocobalamin (VITAMIN B12) 500 MCG tablet, Take 1 tablet (500 mcg total) by mouth daily., Disp: , Rfl:    escitalopram  (LEXAPRO ) 10 MG tablet, Take 1 tablet (10 mg total) by mouth daily., Disp: 30 tablet, Rfl: 6   fluticasone  (FLONASE ) 50 MCG/ACT nasal spray, 1 spray each nostril following sinus rinses twice daily, Disp: 16 g, Rfl: 2   Iron , Ferrous Sulfate , 325 (65 Fe) MG TABS, Take 325 mg by mouth every other day., Disp: 30 tablet, Rfl: 1   magnesium hydroxide (PHILLIPS CHEWS) 311 MG CHEW chewable tablet, Chew 311 mg by mouth every 4 (four) hours as needed., Disp: , Rfl:    metFORMIN  (GLUCOPHAGE -XR) 500 MG 24 hr tablet, Take 1 tablet (500 mg total) by mouth 2 (two) times daily with a meal., Disp: 60 tablet, Rfl: 4   olmesartan  (BENICAR ) 40 MG tablet, Take 1 tablet (40 mg total) by mouth daily., Disp: 30 tablet, Rfl: 3   polyethylene glycol (MIRALAX / GLYCOLAX) 17 g packet, Take 17 g by mouth 2 (two) times daily. Until stooling regularly, then daily or prn, Disp: , Rfl:    rosuvastatin  (CRESTOR ) 10 MG tablet, Take 1 tablet (10 mg total) by mouth daily., Disp: 30 tablet, Rfl: 3   tirzepatide (MOUNJARO) 2.5 MG/0.5ML Pen, Inject 2.5 mg into the skin once a week., Disp: 2 mL, Rfl: 0   Vitamin D , Ergocalciferol , (DRISDOL ) 1.25 MG (50000 UNIT) CAPS capsule, Take 1 capsule (50,000 Units total) by mouth every 7 (seven) days., Disp: 5  capsule, Rfl: 1   amLODipine  (NORVASC ) 10 MG tablet, Take 10 mg by mouth daily. (Patient not taking: Reported on 08/15/2024), Disp: , Rfl:    sulfamethoxazole -trimethoprim  (BACTRIM  DS) 800-160 MG tablet, Take 1 tablet by mouth 2 (two) times daily., Disp: 10 tablet, Rfl: 0  No Known Allergies  Objective:   BP 137/83   Pulse 75   Temp 98.3 F (36.8 C)   Ht 5' 4.5 (1.638 m)   Wt 232 lb 9.6 oz (105.5 kg)   LMP 04/11/2024 (Approximate)   SpO2 99%   BMI 39.31 kg/m      08/15/2024    9:19 AM 08/09/2024    9:38 AM 08/09/2024    9:00 AM  Vitals with BMI  Height 5' 4.5  5' 4.5  Weight 232 lbs 10 oz  228 lbs  BMI 39.32  38.55  Systolic 137 149 849  Diastolic 83 88 84  Pulse 75  73     Physical Exam Vitals and nursing note reviewed.  Constitutional:      Appearance: Normal appearance. She is obese.  HENT:     Head: Normocephalic and atraumatic.  Cardiovascular:     Rate and Rhythm: Normal rate and regular rhythm.     Pulses: Normal pulses.     Heart sounds: Normal heart sounds.  Pulmonary:     Effort: Pulmonary effort is normal.     Breath sounds: Normal breath sounds.  Skin:    General: Skin is warm and dry.  Neurological:     General: No focal deficit present.     Mental Status: She is alert and oriented to person, place, and time. Mental status is at baseline.  Psychiatric:        Mood and Affect: Mood normal.        Behavior: Behavior normal.        Thought Content: Thought content normal.        Judgment: Judgment normal.     Assessment & Plan:  Essential hypertension, benign Assessment & Plan: Continue olmesartan  40mg  daily. Montior at home and report to office if sustains >130/80. Recommend heart healthy diet such as Mediterranean diet with whole grains, fruits, vegetable, fish, lean meats, nuts, and olive oil. Limit salt. Encouraged moderate walking, 3-5 times/week for 30-50 minutes each session. Aim for at least 150 minutes.week. Goal should be pace of 3  miles/hours, or walking 1.5 miles in 30 minutes. Avoid tobacco products. Avoid excess alcohol. Take medications as prescribed and bring medications and blood pressure log with cuff to each office visit. Seek medical care for chest pain, palpitations, shortness of breath with exertion, dizziness/lightheadedness, vision changes, recurrent headaches, or swelling of extremities. Follow up in 3 months with labs week prior  Orders: -     CBC  Type 2 diabetes mellitus in patient with obesity [facility]) Assessment & Plan: Continue Metformin  XR 500mg  daily and Mounjaro 2.5mg  weekly, repeat uACR today.   Foot exam UTD. Vaccines UTD. Retinal eye exam UTD. Recommend heart healthy diet such as Mediterranean diet with whole grains, fruits, vegetable, fish, lean meats, nuts, and olive oil. Limit salt. Encouraged moderate walking, 3-5 times/week for 30-50 minutes each session. Aim for at least 150 minutes.week. Goal should be pace of 3 miles/hours, or walking 1.5 miles in 30 minutes. Seek medical care for urinary frequency, extreme thirst, vision changes, lightheadedness, dizziness.  Follow up in 3 months or sooner if needed.  Orders: -     Microalbumin / creatinine urine ratio  Hyperlipidemia associated with type 2 diabetes mellitus (HCC) Assessment & Plan: Continue rosuvastatin  10mg  daily. LDL at goal <70. I recommend consuming a heart healthy diet such as Mediterranean diet or DASH diet with whole grains, fruits, vegetable, fish, lean meats, nuts, and olive oil. Limit sweets and processed foods. I also encourage moderate intensity exercise 150 minutes weekly. This is 3-5 times weekly for 30-50 minutes each session. Goal should be pace of 3 miles/hours, or walking 1.5 miles in 30 minutes. The ASCVD Risk score (Arnett DK, et al., 2019) failed to calculate for the following reasons:   The valid total cholesterol range is 130 to 320 mg/dL    Other iron  deficiency anemia Assessment & Plan: Continue Ferrous  Sulfate 325mg  every other day. CBC and stool card today. Encouraged increase dietary intake. History of gastric sleeve.   Orders: -     Fecal Globin By Immunochemistry  Dysuria -  Urinalysis, Routine w reflex microscopic  Acute cystitis without hematuria Assessment & Plan: Urine dipstick shows positive for protein, positive for leukocytes, positive for urobilinogen, and positive for ketones.  Micro exam: 10-20 WBC's per HPF and many+ bacteria. Start Bactrim  DS BID x5d. Follow up PRN    Other orders -     Sulfamethoxazole -Trimethoprim ; Take 1 tablet by mouth 2 (two) times daily.  Dispense: 10 tablet; Refill: 0     Follow up plan: Return in about 3 months (around 11/15/2024) for chronic follow-up with labs 1 week prior.  Jeoffrey GORMAN Barrio, FNP

## 2024-08-15 NOTE — Assessment & Plan Note (Signed)
 Continue rosuvastatin  10mg  daily. LDL at goal <70. I recommend consuming a heart healthy diet such as Mediterranean diet or DASH diet with whole grains, fruits, vegetable, fish, lean meats, nuts, and olive oil. Limit sweets and processed foods. I also encourage moderate intensity exercise 150 minutes weekly. This is 3-5 times weekly for 30-50 minutes each session. Goal should be pace of 3 miles/hours, or walking 1.5 miles in 30 minutes. The ASCVD Risk score (Arnett DK, et al., 2019) failed to calculate for the following reasons:   The valid total cholesterol range is 130 to 320 mg/dL

## 2024-08-15 NOTE — Assessment & Plan Note (Signed)
 Continue Metformin  XR 500mg  daily and Mounjaro 2.5mg  weekly, repeat uACR today.   Foot exam UTD. Vaccines UTD. Retinal eye exam UTD. Recommend heart healthy diet such as Mediterranean diet with whole grains, fruits, vegetable, fish, lean meats, nuts, and olive oil. Limit salt. Encouraged moderate walking, 3-5 times/week for 30-50 minutes each session. Aim for at least 150 minutes.week. Goal should be pace of 3 miles/hours, or walking 1.5 miles in 30 minutes. Seek medical care for urinary frequency, extreme thirst, vision changes, lightheadedness, dizziness.  Follow up in 3 months or sooner if needed.

## 2024-08-15 NOTE — Assessment & Plan Note (Signed)
 Continue Ferrous Sulfate  325mg  every other day. CBC and stool card today. Encouraged increase dietary intake. History of gastric sleeve.

## 2024-08-16 ENCOUNTER — Ambulatory Visit: Payer: Self-pay | Admitting: Family Medicine

## 2024-08-16 DIAGNOSIS — R809 Proteinuria, unspecified: Secondary | ICD-10-CM

## 2024-08-16 LAB — MICROALBUMIN / CREATININE URINE RATIO
Creatinine, Urine: 339 mg/dL — ABNORMAL HIGH (ref 20–275)
Microalb Creat Ratio: 3 mg/g{creat} (ref <30)
Microalb, Ur: 0.9 mg/dL

## 2024-08-28 ENCOUNTER — Ambulatory Visit: Admitting: Family Medicine

## 2024-08-29 ENCOUNTER — Other Ambulatory Visit: Payer: Self-pay | Admitting: Adult Health

## 2024-08-31 LAB — FECAL GLOBIN BY IMMUNOCHEMISTRY
FECAL GLOBIN RESULT:: NOT DETECTED
MICRO NUMBER:: 17256509
SPECIMEN QUALITY:: ADEQUATE

## 2024-09-04 ENCOUNTER — Encounter (INDEPENDENT_AMBULATORY_CARE_PROVIDER_SITE_OTHER): Payer: Self-pay

## 2024-09-11 ENCOUNTER — Ambulatory Visit (INDEPENDENT_AMBULATORY_CARE_PROVIDER_SITE_OTHER): Admitting: Family Medicine

## 2024-09-14 ENCOUNTER — Telehealth (INDEPENDENT_AMBULATORY_CARE_PROVIDER_SITE_OTHER): Admitting: Family Medicine

## 2024-09-14 ENCOUNTER — Encounter (INDEPENDENT_AMBULATORY_CARE_PROVIDER_SITE_OTHER): Payer: Self-pay | Admitting: Family Medicine

## 2024-09-14 VITALS — Ht 64.5 in

## 2024-09-14 DIAGNOSIS — E1165 Type 2 diabetes mellitus with hyperglycemia: Secondary | ICD-10-CM

## 2024-09-14 DIAGNOSIS — E559 Vitamin D deficiency, unspecified: Secondary | ICD-10-CM | POA: Diagnosis not present

## 2024-09-14 DIAGNOSIS — Z7985 Long-term (current) use of injectable non-insulin antidiabetic drugs: Secondary | ICD-10-CM | POA: Diagnosis not present

## 2024-09-14 MED ORDER — VITAMIN D (ERGOCALCIFEROL) 1.25 MG (50000 UNIT) PO CAPS: 50000.0000 [IU] | ORAL_CAPSULE | ORAL | 0 refills | Status: DC

## 2024-09-14 MED ORDER — TIRZEPATIDE 5 MG/0.5ML ~~LOC~~ SOAJ: 5.0000 mg | SUBCUTANEOUS | 1 refills | Status: DC

## 2024-09-14 NOTE — Progress Notes (Unsigned)
 Patient told she had protein in her urine and was concerned about her protein consumption.  She was previously following the meal plan but then was not following as strictly.  She was getting in around 1100 calories and was over her protein consumption.   Walking 30 min 3-4 times a week at work

## 2024-09-14 NOTE — Progress Notes (Unsigned)
    TeleHealth Visit:   Areli has verbally consented to this audiovisual technology visit. The patient is located at home, the provider is located at the [facility] office. The participants in this visit include the listed provider and patient. The visit was conducted today via Training And Development Officer.  Chief Complaint: OBESITY Kanesha is here to discuss her progress with her obesity treatment plan along with follow-up of her obesity related diagnoses. Sharetta is on keeping a food journal and adhering to recommended goals of 1000-1100 calories and 80 grams of protein and states she is following her eating plan approximately 60% of the time. Azarya states she is walking 30 minutes 3 times per week.  No data recorded No data recorded No data recorded No data recorded  Reported Weight: NWA  Subjective:   There are no diagnoses linked to this encounter. Assessment/Plan:   Assessment & Plan Type 2 diabetes mellitus with hyperglycemia, without long-term current use of insulin  (HCC)  Hypovitaminosis D  Morbid obesity (HCC)    Marnell is currently in the action stage of change. As such, her goal is to continue with weight loss efforts and has agreed to {MWMwtlossportion/plan2:23431}.   Exercise goals: {MWM EXERCISE RECS:23473}  Behavioral modification strategies: {MWMwtlossdietstrategies3:23432}.  Carsyn has agreed to follow-up with our clinic in {NUMBER 1-10:22536} weeks. She was informed of the importance of frequent follow-up visits to maximize her success with intensive lifestyle modifications for her multiple health conditions.   Objective:   VITALS: Per patient if applicable, see vitals. GENERAL: Alert and in no acute distress. CARDIOPULMONARY: No increased WOB. Speaking in clear sentences.  PSYCH: Pleasant and cooperative. Speech normal rate and rhythm. Affect is appropriate. Insight and judgement are appropriate. Attention is focused, linear, and appropriate.   NEURO: Oriented as arrived to appointment on time with no prompting.   Lab Results  Component Value Date   CREATININE 0.70 08/08/2024   BUN 8 08/08/2024   NA 139 08/08/2024   K 3.8 08/08/2024   CL 105 08/08/2024   CO2 28 08/08/2024   Lab Results  Component Value Date   ALT 11 08/08/2024   AST 14 08/08/2024   ALKPHOS 99 02/29/2024   BILITOT 0.6 08/08/2024   Lab Results  Component Value Date   HGBA1C 6.2 (H) 06/30/2024   HGBA1C 5.9 (H) 02/29/2024   HGBA1C 5.8 05/13/2022   HGBA1C 5.8 01/08/2022   HGBA1C 5.5 04/24/2021   Lab Results  Component Value Date   INSULIN  11.4 02/29/2024   Lab Results  Component Value Date   TSH 1.300 02/29/2024   Lab Results  Component Value Date   CHOL 120 08/08/2024   HDL 44 (L) 08/08/2024   LDLCALC 58 08/08/2024   TRIG 95 08/08/2024   CHOLHDL 2.7 08/08/2024   Lab Results  Component Value Date   VD25OH 65 06/30/2024   VD25OH 28.2 (L) 02/29/2024   VD25OH 35 11/01/2020   Lab Results  Component Value Date   WBC 4.5 08/15/2024   HGB 12.3 08/15/2024   HCT 38.6 08/15/2024   MCV 83.5 08/15/2024   PLT 284 08/15/2024   Lab Results  Component Value Date   IRON  69 06/30/2024   TIBC 293 06/30/2024   FERRITIN 50 06/30/2024    Attestation Statements:   Reviewed by clinician on day of visit: allergies, medications, problem list, medical history, surgical history, family history, social history, and previous encounter notes.    Adelita Cho, MD

## 2024-09-21 ENCOUNTER — Other Ambulatory Visit: Payer: Self-pay | Admitting: Family Medicine

## 2024-09-25 NOTE — Assessment & Plan Note (Signed)
 Patient is feeling ready to increase dose of Mounjaro  to 5mg .  No nausea, vomiting or abdominal pain.  Discussed risk of increasing dosage as well as possible benefit.  New Rx sent into pharmacy.

## 2024-09-25 NOTE — Assessment & Plan Note (Signed)
 Discussed the protein requirement for weight loss which is 1g/kg of body weight per day and protein consumption.  Patient is at barely that with the protein that she is taking in.  Will continue current meal plan as patient does not need to be worried about protein that she was told was found in her urine.

## 2024-09-25 NOTE — Assessment & Plan Note (Signed)
 Tolerating vitamin d  with no nausea, vomiting or muscle weakness.  Needs a refill today.

## 2024-10-18 ENCOUNTER — Other Ambulatory Visit (HOSPITAL_COMMUNITY): Payer: Self-pay | Admitting: Nephrology

## 2024-10-18 DIAGNOSIS — D509 Iron deficiency anemia, unspecified: Secondary | ICD-10-CM

## 2024-10-18 DIAGNOSIS — N200 Calculus of kidney: Secondary | ICD-10-CM

## 2024-10-23 ENCOUNTER — Ambulatory Visit (INDEPENDENT_AMBULATORY_CARE_PROVIDER_SITE_OTHER): Admitting: Physician Assistant

## 2024-10-23 NOTE — Progress Notes (Deleted)
" ° °  SUBJECTIVE: Discussed the use of AI scribe software for clinical note transcription with the patient, who gave verbal consent to proceed.  Chief Complaint: Obesity  Interim History: ***  Dae is here to discuss her progress with her obesity treatment plan. She is on the {HWW Weight Loss Plan:210964005} and states she {CHL AMB IS/IS NOT:210130109} following her eating plan approximately *** % of the time. She states she {CHL AMB IS/IS NOT:210130109} exercising *** minutes *** times per week.   OBJECTIVE: Visit Diagnoses: Problem List Items Addressed This Visit     Diabetes mellitus (HCC) - Primary   Hypertension associated with diabetes (HCC)   Hyperlipidemia associated with type 2 diabetes mellitus (HCC)   Other Visit Diagnoses       Vitamin D  deficiency         Class 2 obesity - START BMI 38.04 (02/29/2024)           No data recorded No data recorded No data recorded No data recorded   ASSESSMENT AND PLAN:  Diet: Hortencia {CHL AMB IS/IS NOT:210130109} currently in the action stage of change. As such, her goal is to {HWW Weight Loss Efforts:210964006}. She {HAS HAS WNU:81165} agreed to {HWW Weight Loss Plan:210964005}.  Exercise: Janell has been instructed {HWW Exercise:210964007} for weight loss and overall health benefits.   Behavior Modification:  We discussed the following Behavioral Modification Strategies today: {HWW Behavior Modification:210964008}. We discussed various medication options to help Talise with her weight loss efforts and we both agreed to ***.  No follow-ups on file.SABRA She was informed of the importance of frequent follow up visits to maximize her success with intensive lifestyle modifications for her multiple health conditions.  Attestation Statements:   Reviewed by clinician on day of visit: allergies, medications, problem list, medical history, surgical history, family history, social history, and previous encounter notes.   Time spent on visit  including pre-visit chart review and post-visit care and charting was *** minutes.    Zhamir Pirro, PA-C  "

## 2024-10-26 ENCOUNTER — Ambulatory Visit (INDEPENDENT_AMBULATORY_CARE_PROVIDER_SITE_OTHER): Admitting: Family Medicine

## 2024-10-26 ENCOUNTER — Encounter (INDEPENDENT_AMBULATORY_CARE_PROVIDER_SITE_OTHER): Payer: Self-pay | Admitting: Family Medicine

## 2024-10-26 VITALS — BP 131/77 | HR 72 | Temp 98.0°F | Ht 64.5 in | Wt 217.0 lb

## 2024-10-26 DIAGNOSIS — E1165 Type 2 diabetes mellitus with hyperglycemia: Secondary | ICD-10-CM

## 2024-10-26 DIAGNOSIS — Z6836 Body mass index (BMI) 36.0-36.9, adult: Secondary | ICD-10-CM | POA: Diagnosis not present

## 2024-10-26 DIAGNOSIS — E1159 Type 2 diabetes mellitus with other circulatory complications: Secondary | ICD-10-CM | POA: Diagnosis not present

## 2024-10-26 DIAGNOSIS — Z7985 Long-term (current) use of injectable non-insulin antidiabetic drugs: Secondary | ICD-10-CM

## 2024-10-26 DIAGNOSIS — I10 Essential (primary) hypertension: Secondary | ICD-10-CM | POA: Diagnosis not present

## 2024-10-26 DIAGNOSIS — E559 Vitamin D deficiency, unspecified: Secondary | ICD-10-CM

## 2024-10-26 DIAGNOSIS — E66812 Obesity, class 2: Secondary | ICD-10-CM

## 2024-10-26 DIAGNOSIS — Z7984 Long term (current) use of oral hypoglycemic drugs: Secondary | ICD-10-CM | POA: Diagnosis not present

## 2024-10-26 DIAGNOSIS — Z6838 Body mass index (BMI) 38.0-38.9, adult: Secondary | ICD-10-CM

## 2024-10-26 MED ORDER — TIRZEPATIDE 5 MG/0.5ML ~~LOC~~ SOAJ: 5.0000 mg | SUBCUTANEOUS | 1 refills | Status: AC

## 2024-10-26 MED ORDER — VITAMIN D (ERGOCALCIFEROL) 1.25 MG (50000 UNIT) PO CAPS: 50000.0000 [IU] | ORAL_CAPSULE | ORAL | 0 refills | Status: AC

## 2024-10-26 NOTE — Progress Notes (Signed)
 "  [name], D.O.  ABFM, ABOM Specializing in Clinical Bariatric Medicine  Office located at: [address] [place], [place]  [address]    Medications Discontinued During This Encounter  Medication Reason   tirzepatide  (MOUNJARO ) 5 MG/0.5ML Pen Reorder   Vitamin D , Ergocalciferol , (DRISDOL ) 1.25 MG (50000 UNIT) CAPS capsule Reorder     Meds ordered this encounter  Medications   tirzepatide  (MOUNJARO ) 5 MG/0.5ML Pen    Sig: Inject 5 mg into the skin once a week.    Dispense:  2 mL    Refill:  1   Vitamin D , Ergocalciferol , (DRISDOL ) 1.25 MG (50000 UNIT) CAPS capsule    Sig: Take 1 capsule (50,000 Units total) by mouth every 7 (seven) days.    Dispense:  12 capsule    Refill:  0      A) FOR THE CHRONIC DISEASE OF OBESITY:  Chief complaint: Obesity Kimberlin is here to discuss her progress with her obesity treatment plan.   History of present illness / Interval history:  DEAUNA YAW is here today for her follow-up office visit.  Since last OV on 09/14/24. Patient states that she had a scare with her proteins and had not been eating properly.   Patient was seen virtually last OV.   10/26/24 10:00   Body Fat % 44.9 %  Muscle Mass (lbs) 113.6 lbs  Fat Mass (lbs) 97.6 lbs  Total Body Water (lbs) 81.4 lbs  Visceral Fat Rating  13   Counseling done on how various foods will affect these numbers and how to maximize success .  Total lbs lost to date: - 8 lbs Total Fat Mass in lbs lost to date: - 1.8 lbs Total weight loss percentage to date: - 3.56 %    Class 2 obesity - START BMI 38.04 BMI 38.0-38.9,adult current 36.69 Nutrition Therapy She is keeping a food journal and adhering to recommended goals of 1000-1100 calories and 80 or more grams of protein daily and states she is following her eating plan approximately 35 % of the time.   - Tracking Calories/Macros: yes  - Eating More Whole Foods: yes  - Adequate Protein Intake: yes  - Adequate Water Intake:  no  - Skipping Meals: no   - Sleeping 7-9 Hours/ Night: yes   Shoshanna is currently in the action stage of change. As such, her goal is to continue weight management plan.  She has agreed to: continue current plan   Physical Activity Allen is walking 30  minutes 5 days per week   Prachi has been advised to work up to 300-450 minutes of moderate intensity aerobic activity a week and strengthening exercises 2-3 times per week for cardiovascular health, weight loss maintenance and preservation of muscle mass.  She has agreed to : Increase volume of physical activity to a goal of 240 minutes a week and Combine aerobic and strengthening exercises for efficiency and improved cardiometabolic health.   Behavioral Modifications Evidence-based interventions for health behavior change were utilized today including the discussion of  1) self monitoring techniques:    - Continue journaling    2) SMART goals for next OV:    - Drink 60 oz of water then 80 oz then increase  - Add 2 days of resistance training  Regarding patient's less desirable eating habits and patterns, we employed the technique of small changes.   We discussed the following today: increasing lean protein intake to established goals, avoiding skipping meals, increasing  water intake , continue to work on implementation of reduced calorie nutritional plan, continue to practice mindfulness when eating, planning for success, and better snacking choices Additional resources provided today: Handout on Daily Food Journaling Log and Handout on Common Characteristics of Successful Weight Losers and Maintainers    Medical Interventions/ Pharmacotherapy Previous Bariatric surgery: n/a Pharmacotherapy for weight loss: She is currently taking Metformin  XR 500 mg daily and Mounjaro  5 mg weekly for medical weight loss.    We discussed various medication options to help Trysten with her weight loss efforts and we both agreed to : Adequate clinical  response to anti-obesity medication, continue current anti-obesity regimen   B) OBESITY RELATED CONDITIONS ADDRESSED TODAY:  Type 2 diabetes mellitus with hyperglycemia, without long-term current use of insulin  [facility]) Assessment & Plan Lab Results  Component Value Date   HGBA1C 6.2 (H) 06/30/2024   HGBA1C 5.9 (H) 02/29/2024   HGBA1C 5.8 05/13/2022   INSULIN  11.4 02/29/2024    On Metformin  XR 500 mg daily and Mounjaro  5 mg weekly for about 3 weeks with reported good compliance and tolerance. Patient states that the first time she took the 5 mg dose she had some stomach cramps but it might have been due to what she had eaten a couple of hour prior to her giving herself the medication. When she gave herself the medication the following week she was fine. Patient denies having any constipation. Reminded patient of the importance of eating all her lean proteins to avoid losing muscle. Will refill Mounjaro  today. Stressed to patient the importance of getting routine diabetic eye and foot exams. Continue following prudent meal plan and decreasing simple carbs and sugars.    Hypertension associated with diabetes [facility]) Assessment & Plan BP Readings from Last 3 Encounters:  10/26/24 131/77  08/15/24 137/83  08/09/24 (!) 149/88   The ASCVD Risk score (Arnett DK, et al., 2019) failed to calculate for the following reasons:   The valid total cholesterol range is 130 to 320 mg/dL  Lab Results  Component Value Date   CREATININE 0.70 08/08/2024   On Benicar  40 mg daily with reported good compliance and tolerance. BP today is at goal- well controlled. No acute concerns. Continue with medications and following prudent meal plan low in sodium.     Hypovitaminosis D Assessment & Plan Lab Results  Component Value Date   VD25OH 65 06/30/2024   VD25OH 28.2 (L) 02/29/2024   VD25OH 35 11/01/2020   Taking ERGO 50K units one weekly with reported good compliance and tolerance. No acute concerns today.  Will refill today. Continue with supplementation. Will obtain labs in the near future.     Medications Discontinued During This Encounter  Medication Reason   tirzepatide  (MOUNJARO ) 5 MG/0.5ML Pen Reorder   Vitamin D , Ergocalciferol , (DRISDOL ) 1.25 MG (50000 UNIT) CAPS capsule Reorder     Meds ordered this encounter  Medications   tirzepatide  (MOUNJARO ) 5 MG/0.5ML Pen    Sig: Inject 5 mg into the skin once a week.    Dispense:  2 mL    Refill:  1   Vitamin D , Ergocalciferol , (DRISDOL ) 1.25 MG (50000 UNIT) CAPS capsule    Sig: Take 1 capsule (50,000 Units total) by mouth every 7 (seven) days.    Dispense:  12 capsule    Refill:  0      Follow up:   Return 11/23/2024 at 12:20 PM  She was informed of the importance of frequent follow up visits to maximize her  success with intensive lifestyle modifications for her multiple health conditions.   Weight Summary and Biometrics   Weight Lost Since Last Visit: 11lb  Weight Gained Since Last Visit: 0lb   Vitals Temp: 98 F (36.7 C) BP: 131/77 Pulse Rate: 72 SpO2: 97 %   Anthropometric Measurements Height: 5' 4.5 (1.638 m) Weight: 217 lb (98.4 kg) BMI (Calculated): 36.69 Weight at Last Visit: 228lb Weight Lost Since Last Visit: 11lb Weight Gained Since Last Visit: 0lb Starting Weight: 225lb Total Weight Loss (lbs): 8 lb (3.629 kg)   Body Composition  Body Fat %: 44.9 % Fat Mass (lbs): 97.6 lbs Muscle Mass (lbs): 113.6 lbs Total Body Water (lbs): 81.4 lbs Visceral Fat Rating : 13   Other Clinical Data Fasting: no Labs: no Today's Visit #: 9 Starting Date: 02/29/24    Objective:   PHYSICAL EXAM: Blood pressure 131/77, pulse 72, temperature 98 F (36.7 C), height 5' 4.5 (1.638 m), weight 217 lb (98.4 kg), SpO2 97%. Body mass index is 36.67 kg/m.  General: she is overweight, cooperative and in no acute distress. PSYCH: Has normal mood, affect and thought process.   HEENT: EOMI, sclerae are  anicteric. Lungs: Normal breathing effort, no conversational dyspnea. Extremities: Moves * 4 Neurologic: A and O * 3, good insight  DIAGNOSTIC DATA REVIEWED: BMET    Component Value Date/Time   NA 139 08/08/2024 0808   NA 139 02/29/2024 1052   K 3.8 08/08/2024 0808   CL 105 08/08/2024 0808   CO2 28 08/08/2024 0808   GLUCOSE 80 08/08/2024 0808   BUN 8 08/08/2024 0808   BUN 9 02/29/2024 1052   CREATININE 0.70 08/08/2024 0808   CALCIUM  8.6 08/08/2024 0808   GFRNONAA 94 02/13/2021 1050   GFRAA 109 02/13/2021 1050   Lab Results  Component Value Date   HGBA1C 6.2 (H) 06/30/2024   HGBA1C 6.1 (H) 05/27/2012   Lab Results  Component Value Date   INSULIN  11.4 02/29/2024   Lab Results  Component Value Date   TSH 1.300 02/29/2024   CBC    Component Value Date/Time   WBC 4.5 08/15/2024 0953   RBC 4.62 08/15/2024 0953   HGB 12.3 08/15/2024 0953   HGB 12.6 02/29/2024 1052   HCT 38.6 08/15/2024 0953   HCT 40.4 02/29/2024 1052   PLT 284 08/15/2024 0953   PLT 266 02/29/2024 1052   MCV 83.5 08/15/2024 0953   MCV 85 02/29/2024 1052   MCH 26.6 (L) 08/15/2024 0953   MCHC 31.9 (L) 08/15/2024 0953   RDW 12.9 08/15/2024 0953   RDW 13.2 02/29/2024 1052   Iron  Studies    Component Value Date/Time   IRON  69 06/30/2024 0930   TIBC 293 06/30/2024 0930   FERRITIN 50 06/30/2024 0930   IRONPCTSAT 24 06/30/2024 0930   Lipid Panel     Component Value Date/Time   CHOL 120 08/08/2024 0808   CHOL 234 (H) 02/29/2024 1052   TRIG 95 08/08/2024 0808   HDL 44 (L) 08/08/2024 0808   HDL 49 02/29/2024 1052   CHOLHDL 2.7 08/08/2024 0808   VLDL 18.4 05/13/2022 0841   LDLCALC 58 08/08/2024 0808   Hepatic Function Panel     Component Value Date/Time   PROT 6.9 08/08/2024 0808   PROT 7.7 02/29/2024 1052   ALBUMIN 4.5 02/29/2024 1052   AST 14 08/08/2024 0808   ALT 11 08/08/2024 0808   ALKPHOS 99 02/29/2024 1052   BILITOT 0.6 08/08/2024 0808   BILITOT 0.5 02/29/2024 1052  Component Value Date/Time   TSH 1.300 02/29/2024 1052   Nutritional Lab Results  Component Value Date   VD25OH 65 06/30/2024   VD25OH 28.2 (L) 02/29/2024   VD25OH 35 11/01/2020    Attestations:   LILLETTE Sonny Laroche, acting as a medical scribe for Barnie Jenkins, DO., have compiled all relevant documentation for today's office visit on behalf of Barnie Jenkins, DO, while in the presence of [employer], DO.  I have reviewed the above documentation for accuracy and completeness, and I agree with the above. Barnie JINNY Jenkins, D.O.  The 21st Century Cures Act was signed into law in 2016 which includes the topic of electronic health records.  This provides immediate access to information in MyChart.  This includes consultation notes, operative notes, office notes, lab results and pathology reports.  If you have any questions about what you read please let us  know at your next visit so we can discuss your concerns and take corrective action if need be.  We are right here with you.  "

## 2024-11-08 ENCOUNTER — Other Ambulatory Visit

## 2024-11-09 ENCOUNTER — Other Ambulatory Visit

## 2024-11-09 DIAGNOSIS — E669 Obesity, unspecified: Secondary | ICD-10-CM

## 2024-11-09 LAB — COMPREHENSIVE METABOLIC PANEL WITH GFR
AG Ratio: 1.4 (calc) (ref 1.0–2.5)
ALT: 9 U/L (ref 6–29)
AST: 14 U/L (ref 10–35)
Albumin: 4.2 g/dL (ref 3.6–5.1)
Alkaline phosphatase (APISO): 79 U/L (ref 37–153)
BUN: 11 mg/dL (ref 7–25)
CO2: 31 mmol/L (ref 20–32)
Calcium: 9 mg/dL (ref 8.6–10.4)
Chloride: 104 mmol/L (ref 98–110)
Creat: 0.73 mg/dL (ref 0.50–1.03)
Globulin: 2.9 g/dL (ref 1.9–3.7)
Glucose, Bld: 85 mg/dL (ref 65–99)
Potassium: 3.7 mmol/L (ref 3.5–5.3)
Sodium: 139 mmol/L (ref 135–146)
Total Bilirubin: 0.8 mg/dL (ref 0.2–1.2)
Total Protein: 7.1 g/dL (ref 6.1–8.1)
eGFR: 97 mL/min/{1.73_m2}

## 2024-11-09 LAB — LIPID PANEL
Cholesterol: 170 mg/dL
HDL: 46 mg/dL — ABNORMAL LOW
LDL Cholesterol (Calc): 106 mg/dL — ABNORMAL HIGH
Non-HDL Cholesterol (Calc): 124 mg/dL
Total CHOL/HDL Ratio: 3.7 (calc)
Triglycerides: 88 mg/dL

## 2024-11-09 LAB — CBC WITH DIFFERENTIAL/PLATELET
Absolute Lymphocytes: 1919 {cells}/uL (ref 850–3900)
Absolute Monocytes: 488 {cells}/uL (ref 200–950)
Basophils Absolute: 41 {cells}/uL (ref 0–200)
Basophils Relative: 1 %
Eosinophils Absolute: 119 {cells}/uL (ref 15–500)
Eosinophils Relative: 2.9 %
HCT: 39.3 % (ref 35.9–46.0)
Hemoglobin: 12.3 g/dL (ref 11.7–15.5)
MCH: 26.3 pg — ABNORMAL LOW (ref 27.0–33.0)
MCHC: 31.3 g/dL — ABNORMAL LOW (ref 31.6–35.4)
MCV: 84 fL (ref 81.4–101.7)
MPV: 11 fL (ref 7.5–12.5)
Monocytes Relative: 11.9 %
Neutro Abs: 1533 {cells}/uL (ref 1500–7800)
Neutrophils Relative %: 37.4 %
Platelets: 283 10*3/uL (ref 140–400)
RBC: 4.68 Million/uL (ref 3.80–5.10)
RDW: 13 % (ref 11.0–15.0)
Total Lymphocyte: 46.8 %
WBC: 4.1 10*3/uL (ref 3.8–10.8)

## 2024-11-13 ENCOUNTER — Ambulatory Visit: Payer: Self-pay | Admitting: Family Medicine

## 2024-11-15 ENCOUNTER — Encounter: Payer: Self-pay | Admitting: Family Medicine

## 2024-11-15 ENCOUNTER — Ambulatory Visit (INDEPENDENT_AMBULATORY_CARE_PROVIDER_SITE_OTHER): Admitting: Family Medicine

## 2024-11-15 VITALS — BP 123/72 | HR 79 | Temp 98.4°F | Ht 64.5 in | Wt 217.2 lb

## 2024-11-15 DIAGNOSIS — I152 Hypertension secondary to endocrine disorders: Secondary | ICD-10-CM

## 2024-11-15 DIAGNOSIS — E1159 Type 2 diabetes mellitus with other circulatory complications: Secondary | ICD-10-CM

## 2024-11-15 DIAGNOSIS — E559 Vitamin D deficiency, unspecified: Secondary | ICD-10-CM

## 2024-11-15 DIAGNOSIS — Z7984 Long term (current) use of oral hypoglycemic drugs: Secondary | ICD-10-CM

## 2024-11-15 DIAGNOSIS — Z6836 Body mass index (BMI) 36.0-36.9, adult: Secondary | ICD-10-CM

## 2024-11-15 DIAGNOSIS — E669 Obesity, unspecified: Secondary | ICD-10-CM

## 2024-11-15 DIAGNOSIS — E785 Hyperlipidemia, unspecified: Secondary | ICD-10-CM

## 2024-11-15 DIAGNOSIS — E66812 Obesity, class 2: Secondary | ICD-10-CM

## 2024-11-15 DIAGNOSIS — Z23 Encounter for immunization: Secondary | ICD-10-CM

## 2024-11-15 DIAGNOSIS — E1169 Type 2 diabetes mellitus with other specified complication: Secondary | ICD-10-CM

## 2024-11-15 MED ORDER — OLMESARTAN MEDOXOMIL 40 MG PO TABS: 40.0000 mg | ORAL_TABLET | Freq: Every day | ORAL | 3 refills | Status: AC

## 2024-11-15 NOTE — Progress Notes (Signed)
 "  Acute Office Visit  Patient ID: Ariel [name], female    DOB: 06-08-1971, 54 y.o.   MRN: [id number]  PCP: Kayla Ariel RAMAN, FNP  Chief Complaint  Patient presents with   Medical Management of Chronic Issues    3 month f/u tdap today      Subjective:     HPI  Ariel [name] is here today for chronic condition management. PMH includes Obesity, DM2, GERD, HTN, anxiety and ADD. Does see [facility]WM, OBGYN, and GI. PSH include gastric sleeve.   DM2: on Metformin  XR 500mg  daily and Mounjaro  5mg  weekly Denies polyuria, polydypsia, paresthesias, or chest pain.  HTN: on Olmesartan  40mg  daily Denies chest pain, palpitations, recurrent headaches, vision changes, lightheadedness, dizziness, dyspnea on exertion, or swelling of extremities.   Anemia: on Ferrous Sulfate  325mg  every other day Denies lightheadedness, dizziness, chest pain, shortness of breath   HLD: on Rosuvastatin  10mg  daily, missed this for several weeks  Vitamin D  deficiency: taking 50,000 units weekly  Discussed the use of AI scribe software for clinical note transcription with the patient, who gave verbal consent to proceed.  History of Present Illness Ariel [name] is a 54 year old female with hypertension and hyperlipidemia who presents for medication management and follow-up.  She has experienced elevated cholesterol levels due to a lapse in taking her rosuvastatin  for two to three weeks because she did not refill her prescription. She has since resumed the medication.  She recently established with nephrology. She was informed of a kidney stone, which she may have passed without symptoms.  Her blood pressure is well-controlled with olmesartan , with home readings mostly around 123/80 mmHg, occasionally reaching 138/80 mmHg. She monitors her blood pressure daily.  For diabetes management, she is on metformin  and Mounjaro . She recently increased her Mounjaro  dose from 2.5 mg to 5 mg. She sometimes skips metformin  due to  gastrointestinal side effects. Is followed by MWM who manages her Mounjaro .  She is on a high-dose vitamin D  regimen, taking 50,000 units weekly since January 15th, following a new prescription.  No chest pain, shortness of breath, lightheadedness, dizziness, or leg swelling. Her acid reflux is well-controlled.  She has lost weight, from 232 lbs to 217 lbs, due to healthy eating and some exercise, though she finds it challenging to exercise regularly due to cold weather and social interactions at the gym.  She is not currently taking iron  supplements as they cause dizziness, but her blood levels are stable. She consumes red meat and green leafy vegetables regularly. She has experienced irregular menstrual cycles, with the last one in December, suggesting perimenopausal status.   Review of Systems  All other systems reviewed and are negative.   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   Vaginal discharge 04/15/2016   Vaginal itching 08/09/2013   no problem now   Yeast infection 04/15/2016    Past Surgical History:  Procedure Laterality Date   COLONOSCOPY WITH PROPOFOL  N/A 12/20/2023  Procedure: COLONOSCOPY WITH PROPOFOL ;  Surgeon: Shaaron Lamar HERO, MD;  Location: [facility];  Service: Endoscopy;  Laterality: N/A;  1230pm, asa 2   LAPAROSCOPIC GASTRIC SLEEVE RESECTION N/A 03/17/2016   Procedure: LAPAROSCOPIC GASTRIC SLEEVE RESECTION WITH UPPER ENDOSCOPY;  Surgeon: Alm Angle, MD;  Location: [facility];  Service: General;  Laterality: N/A;   TUBAL LIGATION      UPPER GI ENDOSCOPY  03/17/2016   Procedure: UPPER GI ENDOSCOPY;  Surgeon: Alm Angle, MD;  Location: [facility];  Service: General;;   WISDOM TOOTH EXTRACTION      Outpatient Medications Prior to Visit  Medication Sig Dispense Refill   cyanocobalamin  (VITAMIN B12) 500 MCG tablet Take 1 tablet (500 mcg total) by mouth daily.     escitalopram  (LEXAPRO ) 10 MG tablet TAKE ONE TABLET BY MOUTH EVERY DAY 30 tablet 6   fluticasone  (FLONASE ) 50 MCG/ACT nasal spray 1 spray each nostril following sinus rinses twice daily 16 g 2   Iron , Ferrous Sulfate , 325 (65 Fe) MG TABS Take 325 mg by mouth every other day. 30 tablet 1   magnesium hydroxide (PHILLIPS CHEWS) 311 MG CHEW chewable tablet Chew 311 mg by mouth every 4 (four) hours as needed.     metFORMIN  (GLUCOPHAGE -XR) 500 MG 24 hr tablet Take 1 tablet (500 mg total) by mouth 2 (two) times daily with a meal. 60 tablet 4   polyethylene glycol (MIRALAX  / GLYCOLAX ) 17 g packet Take 17 g by mouth 2 (two) times daily. Until stooling regularly, then daily or prn     rosuvastatin  (CRESTOR ) 10 MG tablet Take 1 tablet (10 mg total) by mouth daily. 30 tablet 3   tirzepatide  (MOUNJARO ) 5 MG/0.5ML Pen Inject 5 mg into the skin once a week. 2 mL 1   Vitamin D , Ergocalciferol , (DRISDOL ) 1.25 MG (50000 UNIT) CAPS capsule Take 1 capsule (50,000 Units total) by mouth every 7 (seven) days. 12 capsule 0   olmesartan  (BENICAR ) 40 MG tablet Take 1 tablet (40 mg total) by mouth daily. 30 tablet 3   No facility-administered medications prior to visit.    Allergies[1]     Objective:    BP 123/72   Pulse 79   Temp 98.4 F (36.9 C)   Ht 5' 4.5 (1.638 m)   Wt 217 lb 3.2 oz (98.5 kg)   LMP 09/11/2024 (Approximate)   SpO2 98%   BMI 36.71 kg/m  BP Readings from Last 3 Encounters:  11/15/24 123/72  10/26/24 131/77  08/15/24 137/83   Wt Readings from Last 3 Encounters:  11/15/24 217 lb 3.2 oz (98.5 kg)  10/26/24 217 lb (98.4 kg)  08/15/24 232 lb 9.6 oz (105.5 kg)       Physical Exam Vitals and nursing note reviewed.  Constitutional:      Appearance: Normal appearance. She is obese.  HENT:     Head: Normocephalic and atraumatic.  Cardiovascular:     Rate and Rhythm: Normal rate and regular rhythm.     Pulses: Normal pulses.     Heart sounds: Normal heart sounds.  Pulmonary:     Effort: Pulmonary effort is normal.     Breath sounds: Normal breath sounds.  Skin:    General: Skin is warm and dry.  Neurological:     General: No focal deficit present.     Mental Status: She is alert and oriented to person, place, and time. Mental status is at baseline.  Psychiatric:        Mood and Affect:  Mood normal.        Behavior: Behavior normal.        Thought Content: Thought content normal.        Judgment: Judgment normal.       No results found for any visits on 11/15/24.     Assessment & Plan:   Problem List Items Addressed This Visit       Cardiovascular and Mediastinum   Hypertension associated with diabetes (HCC)   Relevant Medications   olmesartan  (BENICAR ) 40 MG tablet     Endocrine   Type 2 diabetes mellitus in patient with obesity (HCC)   Relevant Medications   olmesartan  (BENICAR ) 40 MG tablet   Hyperlipidemia associated with type 2 diabetes mellitus (HCC) - Primary   Relevant Medications   olmesartan  (BENICAR ) 40 MG tablet   Other Relevant Orders   Lipid panel     Other   Hypovitaminosis D   Morbid obesity (HCC)   Other Visit Diagnoses       Vitamin D  deficiency       Relevant Orders   VITAMIN D  25 Hydroxy (Vit-D Deficiency, Fractures)     Need for vaccination       Relevant Orders   Tdap vaccine greater than or equal to 7yo IM (Completed)       Assessment and Plan Assessment & Plan Type 2 diabetes mellitus in patient with obesity Managed with metformin  and tirzepatide . Reports gastrointestinal side effects. Weight loss noted. A1c pending. - Checked A1c to assess diabetes control. - Consider  discontinuing metformin  if A1c is well-controlled. - Continue tirzepatide  at current dose weekly.  Essential hypertension, benign Blood pressure controlled with olmesartan . Home readings mostly below 130/80 mmHg. - Continue olmesartan  40 mg daily. - Monitor blood pressure at home regularly. - Recommend heart healthy diet such as Mediterranean diet with whole grains, fruits, vegetable, fish, lean meats, nuts, and olive oil. Limit salt. Encouraged moderate walking, 3-5 times/week for 30-50 minutes each session. Aim for at least 150 minutes.week. Goal should be pace of 3 miles/hours, or walking 1.5 miles in 30 minutes. Avoid tobacco products. Avoid excess alcohol. Take medications as prescribed and bring medications and blood pressure log with cuff to each office visit. Seek medical care for chest pain, palpitations, shortness of breath with exertion, dizziness/lightheadedness, vision changes, recurrent headaches, or swelling of extremities.   Hyperlipidemia associated with type 2 diabetes mellitus Cholesterol elevated due to missed rosuvastatin  doses. Medication resumed. - Continue rosuvastatin  10 mg daily. - Recheck cholesterol levels in 6-8 weeks. - The 10-year ASCVD risk score (Arnett DK, et al., 2019) is: 10.2%   Values used to calculate the score:     Age: 65 years     Clinically relevant sex: Female     Is Non-Hispanic African American: Yes     Diabetic: Yes     Tobacco smoker: No     Systolic Blood Pressure: 123 mmHg     Is BP treated: Yes     HDL Cholesterol: 46 mg/dL     Total Cholesterol: 170 mg/dL   Obesity Weight loss achieved. Engaging in healthy eating and exercise. - Continue current diet and exercise regimen. - Follow up with medical weight management every 6 weeks.  Vitamin D  deficiency On high-dose vitamin D  since January 15th. Transition to maintenance dose planned. - Continue vitamin D  50,000 units weekly for 8 weeks. - Recheck vitamin D  levels in 6-8  weeks.  General Health Maintenance Routine health maintenance discussed. Physical exam scheduled  for September. - Schedule physical exam in September unless issues arise sooner.    Meds ordered this encounter  Medications   olmesartan  (BENICAR ) 40 MG tablet    Sig: Take 1 tablet (40 mg total) by mouth daily.    Dispense:  30 tablet    Refill:  3    Return in about 7 months (around 06/29/2025) for annual physical with labs 1 week prior.  Ariel GORMAN Barrio, FNP [facility]      [1] No Known Allergies  "

## 2024-11-23 ENCOUNTER — Ambulatory Visit (INDEPENDENT_AMBULATORY_CARE_PROVIDER_SITE_OTHER): Admitting: Family Medicine

## 2025-01-01 ENCOUNTER — Other Ambulatory Visit

## 2025-06-25 ENCOUNTER — Other Ambulatory Visit

## 2025-07-03 ENCOUNTER — Encounter: Admitting: Family Medicine
